# Patient Record
Sex: Male | Born: 1970 | Race: White | Hispanic: No | Marital: Married | Smoking: Current every day smoker
Health system: Southern US, Community
[De-identification: ages and names within clinical notes are randomized; demographics above are authoritative.]

## PROBLEM LIST (undated history)

## (undated) DIAGNOSIS — I2510 Atherosclerotic heart disease of native coronary artery without angina pectoris: Secondary | ICD-10-CM

## (undated) DIAGNOSIS — I214 Non-ST elevation (NSTEMI) myocardial infarction: Secondary | ICD-10-CM

## (undated) DIAGNOSIS — I10 Essential (primary) hypertension: Secondary | ICD-10-CM

## (undated) DIAGNOSIS — G4733 Obstructive sleep apnea (adult) (pediatric): Secondary | ICD-10-CM

## (undated) DIAGNOSIS — R519 Headache, unspecified: Secondary | ICD-10-CM

## (undated) DIAGNOSIS — R51 Headache: Secondary | ICD-10-CM

## (undated) DIAGNOSIS — G4730 Sleep apnea, unspecified: Secondary | ICD-10-CM

## (undated) DIAGNOSIS — E7800 Pure hypercholesterolemia, unspecified: Secondary | ICD-10-CM

## (undated) HISTORY — DX: Obstructive sleep apnea (adult) (pediatric): G47.33

## (undated) HISTORY — DX: Non-ST elevation (NSTEMI) myocardial infarction: I21.4

---

## 2003-05-13 ENCOUNTER — Emergency Department (HOSPITAL_COMMUNITY): Admission: EM | Admit: 2003-05-13 | Discharge: 2003-05-13 | Payer: Self-pay | Admitting: Emergency Medicine

## 2003-08-04 ENCOUNTER — Emergency Department (HOSPITAL_COMMUNITY): Admission: EM | Admit: 2003-08-04 | Discharge: 2003-08-04 | Payer: Self-pay | Admitting: Emergency Medicine

## 2003-08-30 ENCOUNTER — Emergency Department (HOSPITAL_COMMUNITY): Admission: EM | Admit: 2003-08-30 | Discharge: 2003-08-31 | Payer: Self-pay | Admitting: Emergency Medicine

## 2003-09-29 ENCOUNTER — Emergency Department (HOSPITAL_COMMUNITY): Admission: EM | Admit: 2003-09-29 | Discharge: 2003-09-29 | Payer: Self-pay | Admitting: Emergency Medicine

## 2007-10-18 ENCOUNTER — Inpatient Hospital Stay (HOSPITAL_COMMUNITY): Admission: EM | Admit: 2007-10-18 | Discharge: 2007-10-24 | Payer: Self-pay | Admitting: Emergency Medicine

## 2007-10-19 DIAGNOSIS — I214 Non-ST elevation (NSTEMI) myocardial infarction: Secondary | ICD-10-CM

## 2007-10-19 HISTORY — PX: CORONARY STENT PLACEMENT: SHX1402

## 2007-10-19 HISTORY — DX: Non-ST elevation (NSTEMI) myocardial infarction: I21.4

## 2007-10-23 HISTORY — PX: CORONARY STENT PLACEMENT: SHX1402

## 2010-07-03 NOTE — Cardiovascular Report (Signed)
NAMEMarland Goodwin  Isaac, Goodwin NO.:  [id number]   MEDICAL RECORD NO.:  [id number]          PATIENT TYPE:  INP   LOCATION:  2928                         FACILITY:  [facility]   PHYSICIAN:  Nicki Guadalajara, M.D.     DATE OF BIRTH:  1970-02-28   DATE OF PROCEDURE:  10/19/2007  DATE OF DISCHARGE:                            CARDIAC CATHETERIZATION   INDICATIONS:  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  He developed new onset chest pain yesterday  following his security walk.  He presented to the emergency room where  his initial ECG did show mild T-wave changes in lead III.  He was  treated with nitroglycerin as well as Dilaudid.  Subsequent ECG showed  complete normalization of his inferior ST-segment changes.  He was  treated with Integrilin, heparinization, IV nitroglycerin as well as IV  beta-blocker therapy.  He is ruled in for a non-ST segment elevation MI  with CK increasing to 492, MB 57.4, and troponin increasing up to 6.69.  He has remained asymptomatic and has not demonstrate any new EKG  changes.  He is now referred for cardiac catheterization.   PROCEDURE:  After premedication with Versed 2 mg intravenously as well  as fentanyl 50 mg, the patient was prepped and draped in the usual  fashion.  His right femoral artery was punctured anteriorly and a 6-  French sheath was inserted.  Diagnostic catheterization was done  utilizing 6-French Judkins 4 left and right coronary catheters.  The  right catheter was used for selective angiography into the left  subclavian and internal mammary artery system.  A pigtail catheter was  used for biplane selective cine left ventriculography as well as with a  demonstration of multivessel disease with 90% stenosis in the LAD, 80-  90% in the mid LAD, 80-90%  stenosis in the obtuse marginal vessel and  with 90% very proximal eccentric stenosis in the very proximal RCA near  the ostium.  It was felt most likely that in light of the ST-segment  changes transiently yesterday in the inferior leads most likely was the  RCA contributing to yesterday's event.  Possibly, the plan was to  attempt staged intervention with plans to perform LAD and circumflex  marginal intervention.  The patient had been maintained on an Integrilin  drip over the night as well as heparin.  He received additional heparin  in the lab based on ACT recordings.  An ACT was documented to be  therapeutic following additional heparinization of 6000 units.  Due to  the upper takeoff of the right, a 6-French FR-3.5 guide with side holes  was used.  A Prowater wire was advanced down the RCA.  The patient was  given 600 mg of Plavix prior to the intervention and also received  several doses of IC nitroglycerin to make certain this was not spasm.  Due to the eccentric plaque, it  was felt that predilatation with cutting  balloon arthrotomy would be beneficial for plaque dispersion.  A 3.0 x  10 mm [place] cutting balloon arthrotomy catheter was then inserted  with several cuts made up to 5 atmospheres.  Since there was an apparent  taper in the vessel from the ostium which seemed only slightly greater  than 3.0 to the proximal bend which seemed at least 3.5 Cypher stent.  This was successfully deployed x2 by 16 and 17 atmospheres.  Post-stent  dilatation was done utilizing a 3.5 x 8 mm [place] Voyager balloon with  tapering of the stent at approximately 3.25 ostially to 3.4, which was  in the distal aspect of this proximal stent.  There was brisk TIMI 3  flow.  The 90% stenosis was reduced to 0%.  The patient tolerated the  procedure well.   HEMODYNAMIC DATA:  Central aortic pressure was 105/75.  Left ventricle  pressure 105/7.  Post A-wave 20.   ANGIOGRAPHIC DATA:  Left main coronary  was angiographically normal and  bifurcated into an LAD and left circumflex system.   The LAD was a moderate-sized vessel which ended in a twin like LAD  system and did not reach the LV apex.  After the first diagonal and  moderate septal perforating artery, there was 90% stenosis in the LAD.   The circumflex vessel gave rise to 2 major marginals noted in the  posterolateral type vessel.  There was 80-90% diffuse proximal stenosis  in the OM1 vessel.   The right coronary artery had an upward takeoff and had an eccentric 90%  stenosis proximally prior to the bend in the vessel.  There was luminal  irregularity of the mid RCA with narrowing of 20% in the mid segment as  well as before the crux.  There was 40-50% followed by 40% stenosis in  the PDA.   The left subclavian and internal mammary artery were normal.   Biplane cine left ventriculography revealed normal LV contractility  without focal segmental wall motion abnormalities.  Distal aortography  did not demonstrate CA with initial cutting balloon atherotomy with a  3.0 x 10 mm atherotomy balloon and DES stenting with a 3.0 x 13 mm drug-  eluting CYPHER stent postdilated to 3.25 up to 3.4 mm.  The 90% stenosis  was reduced to 0%.  There was brisk TIMI 3 flow.  There was no evidence  for dissection.   IMPRESSION:  1. Non-ST-segment elevation myocardial infarction, October 18, 2006,      most likely due to transient spasm on top of fixed high-grade      disease in the proximal right coronary artery.  2. Normal left ventricular function.  3. A 90% mid left anterior descending artery stenosis.  4. An 80-90% stenosis in the obtuse 1 marginal vessel.  5. Eccentric proximal stenosis in the right coronary artery.  6. Successful cutting balloon atherotomy/stenting with a 3.0 x 13 mm      drug-eluting CYPHER stent just beyond the ostium proximally in the      right coronary artery with post-stent dilatation ranging from 3.25-      3.4  mm done with Integrilin/Plavix/heparin and IC as well as IV      nitroglycerin.   PLAN:  Staged intervention to the LAD intermediate vessel.           ______________________________  Nicki Guadalajara, M.D.     TK/MEDQ  D:  10/19/2007  T:  10/20/2007  Job:  [id number]

## 2010-07-03 NOTE — Cardiovascular Report (Signed)
NAME:  Isaac Goodwin, Isaac Goodwin NO.:  [id number]   MEDICAL RECORD NO.:  [id number]          PATIENT TYPE:  INP   LOCATION:  6523                         FACILITY:  [facility]   PHYSICIAN:  Nicki Guadalajara, M.D.     DATE OF BIRTH:  17-Jun-1970   DATE OF PROCEDURE:  10/23/2007  DATE OF DISCHARGE:                            CARDIAC CATHETERIZATION   INDICATIONS:  Isaac Goodwin is a 40 year old gentleman who presented  to [facility] on October 18, 2007, with new-onset chest pain.  His chest pain immediately resolved and his transient inferior T-wave  change normalized.  Enzymes subsequently were positive with non-ST-  segment elevation MI.  On October 19, 2007, he underwent diagnostic  cardiac catheterization.  This revealed multivessel CAD with 90+ percent  stenosis eccentrically with eccentric plaque in the proximal RCA which  was felt to be the culprit lesion.  He did have scattered 20% stenosis  further in the RCA with 40-50% PDA stenosis.  In addition, he had 90%  LAD stenosis after a large septal perforating artery with diffuse  narrowing in the small first diagonal vessel and had 80-90% diffuse  narrowing in the OM1 vessel of a large circumflex system.  Since his ECG  changes were transient in lead III, it was felt most likely that his RCA  was the culprit.  He underwent intervention with initial cutting balloon  and ultimate insertion of a 3.0 x 13-mm Cypher stent postdilated with  3.5 [place] Voyager balloon.  He is now brought back to the laboratory for  planned stage intervention to his LAD and circumflex marginal vessel.   PROCEDURE:  After premedication with Versed 2 mg plus fentanyl 50 mg,  the patient was prepped and draped in the usual fashion.  Right femoral  artery was punctured anteriorly and a 6-French sheath was inserted.  Initially, a 6-French diagnostic right catheter was used for re-look of  the RCA.  This demonstrated widely patent stent without  restenosis.  Attention was therefore directed at the planned stage intervention to  the LAD and OM1 vessel.  An FL-4, 6-French guide was used.  Angiomax was  given.  The patient had received 600 mg of Plavix 4 days ago and has  been on daily Plavix and had already received his morning dose of Plavix  prior to coming to the laboratory.  IC nitroglycerin was also  administered down the left coronary system.  An ATW wire was advanced  down the LAD.  Initial predilatation was done with a 3.0 x 12-mm apex  balloon.  Since the septal side branch was large and the stenosis seemed  to extend just to the superior border of the LAD above the septal, it  was felt that a stent should be placed encompassing the septal.  For  this reason, a 3.0 x 18-mm Cypher stent was then inserted and  successfully deployed x2 at 16 and 17 atmospheres.  This was to ensure  excellent side branch access and aperture.  A 3.5 x 12 mm [place] Voyager  balloon was used for poststent dilatation with vessel  tapering from 3.4  mm to 3.29 mm extending from the proximal to the distal aspect of this  stent.  The 90% stenosis was reduced to 0%.  There was not any  impingement on the septal perforating artery.   Attention was then directed at the left circumflex marginal vessel.  The  ATW wire was then advanced down the OM1 vessel.  Predilatation was done  with a 2.5 x 12-mm apex balloon.  A 2.5 x 13-mm Cypher stent was then  successfully deployed x2 at 14 and 15 atmospheres.  A 2.75 x 12-mm [place]  Voyager balloon was used for poststent dilatation up to 2.74 mm.  The  85% stenosis was reduced to 0%.  During the procedure, the patient  received several additional doses of IV fentanyl as well as Versed.  He  also received several doses of IC nitroglycerin.  He tolerated the  procedure well with.  Plans are for sheath removal later today.   HEMODYNAMIC DATA:  Central aortic pressure was 100/72.   ANGIOGRAPHIC DATA:  Re-look at the right  coronary artery showed a widely  patent RCA stent without restenosis.  There was no change in the  scattered RCA disease as noted above with 40% narrowing in the PDA and  20% narrowing in the mid and distal segments.   The left anterior descending artery arose from the left main and was  large-caliber vessel.  The first diagonal vessel was small caliber and  there was diffuse 90% segmental stenoses involving a long segment of  this small vessel.  This was not intervened upon.  The LAD gave rise to  a large septal perforating artery that had 90% stenosis extending to the  septal perforator, but there did appear to be plaque on the superior  aspect of the LAD just above the septal and for this reason, the stent  placement was proximal to the septal extending distally.  Following  ultimate insertion of a 3.0 x 18-mm Cypher stent with postdilatation  tapering from 3.4-3.29 mm, the 90% stenosis was reduced to 0%.  There  was TIMI III flow and no evidence for dissection.   The circumflex vessel was large-caliber vessel in the AV groove portion  in particular.  The first marginal vessel had diffuse 80-85% stenosis.  Following successful dilatation with ultimate stenting with a 2.5 x 13-  mm Cypher stent postdilated at 2.75 mm, this was reduced to 0%.  There  was brisk TIMI III flow and no evidence for dissection.   IMPRESSION:  1. Successful percutaneous transluminal coronary angioplasty/stenting      of a 90% left anterior descending stenosis after a very large first      septal perforating artery with ultimate insertion of a 3.0 x 18-mm      Cypher stent postdilated to a vessel taper of 3.4-3.3 mm with the      90% stenosis being reduced to 0%.  2. Successful percutaneous transluminal coronary angioplasty stenting      of the left circumflex obtuse marginal 1 vessel with an 80-85%      stenosis being reduced to 0% with ultimate insertion of a 2.5 x 13-      mm Cypher stent postdilated 2.75  mm.  3. Widely patent right coronary artery stent from procedure of October 19, 2007, done with bivalirudin/Plavix/intracoronary IV      nitroglycerin.           ______________________________  Nicki Guadalajara, M.D.  TK/MEDQ  D:  10/23/2007  T:  10/24/2007  Job:  [id number]

## 2010-07-03 NOTE — Discharge Summary (Signed)
NAMEJOHNWESLEY, LEDERMAN NO.:  [id number]   MEDICAL RECORD NO.:  [id number]          PATIENT TYPE:  INP   LOCATION:  6523                         FACILITY:  [facility]   PHYSICIAN:  Nicki Guadalajara, M.D.     DATE OF BIRTH:  [birth year]   DATE OF ADMISSION:  10/18/2007  DATE OF DISCHARGE:  10/24/2007                               DISCHARGE SUMMARY   DISCHARGE DIAGNOSES:  1. ST myocardial infarction this admission, treated with right      coronary artery stenting on October 19, 2007, with a staged      intervention to the left anterior descending and Cypher stenting on      October 23, 2007.  2. Preserved left ventricular function.  3. History of smoking.  4. History of hypertension, previously untreated.  5. Dyslipidemia.   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  He was admitted on October 18, 2007, with  chest pain consistent with unstable angina.  He was admitted by Dr.  [name] through the emergency room and started on heparin, Integrilin, and  nitroglycerin.  His EKG showed inferior ischemia.  He was taken to the  Cath Lab on October 19, 2007.  His CKs peaked at 492 with 57 MBs.  Catheterization on October 19, 2007, showed a 95% proximal RCA stenosis,  80-90% OM1 stenosis, 90% LAD stenosis after the diagonal, and diffusely  diseased small first diagonal.  LV function was normal.  He underwent  intervention to the native RCA with a cutting balloon and Cypher  stenting.  Plan was for staged intervention to the LAD.  The patient was  kept in the unit on nitrates until October 20, 2007.  He was  transferred to the floor, had no further pain.  He was seen in consult  by the tobacco cessation counselor.  Cardiac Rehab was also seen him.  Medications were adjusted for treatment of his hypertension and  dyslipidemia.  He underwent staged intervention to the LAD with a Cypher  stent and also an intervention to the OM1 with a  Cypher stent.  Dr.  [name] plans to treat the diffusely diseased small first diagonal  medically.  We feel he can be discharged on October 24, 2007, pending a  followup CK-MB and troponin.   DISCHARGE MEDICATIONS:  1. Coated aspirin 325 mg a day.  2. Plavix 75 mg a day.  3. Simvastatin 40 mg a day.  4. Pepcid 20 mg a day.  5. Niacin SR 500 mg a day.  6. Metoprolol 500 mg b.i.d.  7. Imdur 30 mg a day.  8. Altace 5 mg a day.  9. Wellbutrin SR 150 mg a day.  10.Nitroglycerin sublingual p.r.n.   LABORATORY DATA:  CKs peaked at 492 with 57 MBs.  Lipids showed  cholesterol 165, LDL 108, HDL 21.  LFTs were normal.  Sodium 139,  potassium 4.1, BUN 12, creatinine 1.3.  White count 9.0, hemoglobin  14.3, hematocrit 41.2, platelets 224.  Chest x-ray shows no active  disease.   DISPOSITION:  The patient is discharged in stable condition.  He will  follow up with Dr. [name] in a couple of weeks as an outpatient.      Abelino Derrick, P.A.    ______________________________  Nicki Guadalajara, M.D.    Lenard Lance  D:  10/24/2007  T:  10/24/2007  Job:  [id number]   cc:   Nicki Guadalajara, M.D.

## 2010-11-21 LAB — CBC
HCT: 41
HCT: 43
Hemoglobin: 13.9
Hemoglobin: 14.2
Hemoglobin: 14.3
Hemoglobin: 14.7
MCHC: 34.2
Platelets: 224
RBC: 4.41
RBC: 4.51
RBC: 4.71
RDW: 12.6
RDW: 12.6
RDW: 12.8
WBC: 8.6
WBC: 9

## 2010-11-21 LAB — BASIC METABOLIC PANEL
BUN: 12
CO2: 25
CO2: 25
CO2: 27
Calcium: 9
Calcium: 9.1
Calcium: 9.2
Chloride: 103
Creatinine, Ser: 1.06
GFR calc Af Amer: >60
GFR calc Af Amer: >60
GFR calc Af Amer: >60
GFR calc non Af Amer: 60 — ABNORMAL LOW
GFR calc non Af Amer: >60
GFR calc non Af Amer: >60
Glucose, Bld: 101 — ABNORMAL HIGH
Glucose, Bld: 104 — ABNORMAL HIGH
Glucose, Bld: 86
Potassium: 3.5
Potassium: 3.8
Potassium: 4.1
Sodium: 136
Sodium: 137
Sodium: 138
Sodium: 139

## 2010-11-21 LAB — CARDIAC PANEL(CRET KIN+CKTOT+MB+TROPI)
Relative Index: 2.4
Total CK: 100
Troponin I: 2.44

## 2010-11-21 LAB — CK TOTAL AND CKMB (NOT AT ARMC)
CK, MB: 10.1 — ABNORMAL HIGH
CK, MB: 5.1 — ABNORMAL HIGH
Relative Index: 4.2 — ABNORMAL HIGH
Relative Index: 6.8 — ABNORMAL HIGH
Total CK: 147

## 2010-11-21 LAB — GLUCOSE, CAPILLARY: Glucose-Capillary: 124 — ABNORMAL HIGH

## 2010-11-21 LAB — TROPONIN I: Troponin I: 0.4 — ABNORMAL HIGH

## 2011-10-17 ENCOUNTER — Encounter (HOSPITAL_COMMUNITY): Payer: Self-pay | Admitting: *Deleted

## 2011-10-17 ENCOUNTER — Emergency Department (HOSPITAL_COMMUNITY): Payer: Medicaid Other

## 2011-10-17 ENCOUNTER — Emergency Department (HOSPITAL_COMMUNITY)
Admission: EM | Admit: 2011-10-17 | Discharge: 2011-10-18 | Disposition: A | Discharge status: Home / Self Care | Payer: Medicaid Other | Attending: Emergency Medicine | Admitting: Emergency Medicine

## 2011-10-17 ENCOUNTER — Emergency Department (HOSPITAL_COMMUNITY)
Admission: EM | Admit: 2011-10-17 | Discharge: 2011-10-17 | Disposition: A | Discharge status: Home / Self Care | Payer: Medicaid Other | Attending: Emergency Medicine | Admitting: Emergency Medicine

## 2011-10-17 DIAGNOSIS — I10 Essential (primary) hypertension: Secondary | ICD-10-CM | POA: Insufficient documentation

## 2011-10-17 DIAGNOSIS — M62838 Other muscle spasm: Secondary | ICD-10-CM

## 2011-10-17 DIAGNOSIS — E7800 Pure hypercholesterolemia, unspecified: Secondary | ICD-10-CM | POA: Insufficient documentation

## 2011-10-17 DIAGNOSIS — G4730 Sleep apnea, unspecified: Secondary | ICD-10-CM | POA: Insufficient documentation

## 2011-10-17 DIAGNOSIS — I2510 Atherosclerotic heart disease of native coronary artery without angina pectoris: Secondary | ICD-10-CM | POA: Insufficient documentation

## 2011-10-17 DIAGNOSIS — G44209 Tension-type headache, unspecified, not intractable: Secondary | ICD-10-CM

## 2011-10-17 DIAGNOSIS — R51 Headache: Secondary | ICD-10-CM

## 2011-10-17 DIAGNOSIS — F17200 Nicotine dependence, unspecified, uncomplicated: Secondary | ICD-10-CM | POA: Insufficient documentation

## 2011-10-17 DIAGNOSIS — Z79899 Other long term (current) drug therapy: Secondary | ICD-10-CM | POA: Insufficient documentation

## 2011-10-17 DIAGNOSIS — Z720 Tobacco use: Secondary | ICD-10-CM

## 2011-10-17 DIAGNOSIS — Z880 Allergy status to penicillin: Secondary | ICD-10-CM | POA: Insufficient documentation

## 2011-10-17 DIAGNOSIS — J069 Acute upper respiratory infection, unspecified: Secondary | ICD-10-CM

## 2011-10-17 HISTORY — DX: Atherosclerotic heart disease of native coronary artery without angina pectoris: I25.10

## 2011-10-17 HISTORY — DX: Pure hypercholesterolemia, unspecified: E78.00

## 2011-10-17 HISTORY — DX: Sleep apnea, unspecified: G47.30

## 2011-10-17 HISTORY — DX: Headache, unspecified: R51.9

## 2011-10-17 HISTORY — DX: Essential (primary) hypertension: I10

## 2011-10-17 HISTORY — DX: Headache: R51

## 2011-10-17 LAB — CBC WITH DIFFERENTIAL/PLATELET
Basophils Absolute: 0.1 10*3/uL (ref 0.0–0.1)
Eosinophils Absolute: 0.3 10*3/uL (ref 0.0–0.7)
Eosinophils Relative: 2 % (ref 0–5)
HCT: 48.4 % (ref 39.0–52.0)
Lymphocytes Relative: 23 % (ref 12–46)
MCH: 32.2 pg (ref 26.0–34.0)
MCV: 87.5 fL (ref 78.0–100.0)
Monocytes Absolute: 0.8 10*3/uL (ref 0.1–1.0)
Platelets: 219 10*3/uL (ref 150–400)
RDW: 12.3 % (ref 11.5–15.5)
WBC: 10.8 10*3/uL — ABNORMAL HIGH (ref 4.0–10.5)

## 2011-10-17 LAB — BASIC METABOLIC PANEL
CO2: 29 mEq/L (ref 19–32)
Calcium: 9.9 mg/dL (ref 8.4–10.5)
Creatinine, Ser: 0.96 mg/dL (ref 0.50–1.35)
GFR calc non Af Amer: >90 mL/min (ref >90)
Glucose, Bld: 109 mg/dL — ABNORMAL HIGH (ref 70–99)

## 2011-10-17 LAB — RAPID URINE DRUG SCREEN, HOSP PERFORMED
Amphetamines: NOT DETECTED
Benzodiazepines: NOT DETECTED
Cocaine: POSITIVE — AB
Opiates: NOT DETECTED

## 2011-10-17 LAB — URINALYSIS, ROUTINE W REFLEX MICROSCOPIC
Glucose, UA: NEGATIVE mg/dL
Hgb urine dipstick: NEGATIVE
Ketones, ur: NEGATIVE mg/dL
Protein, ur: NEGATIVE mg/dL
Urobilinogen, UA: 1 mg/dL (ref 0.0–1.0)

## 2011-10-17 MED ORDER — HYDROMORPHONE HCL PF 1 MG/ML IJ SOLN
0.5000 mg | INTRAMUSCULAR | Status: AC
  Administered 2011-10-17: 0.5 mg via INTRAVENOUS
  Filled 2011-10-17: qty 1

## 2011-10-17 MED ORDER — KETOROLAC TROMETHAMINE 30 MG/ML IJ SOLN: 30.0000 mg | Freq: Once | INTRAMUSCULAR | Status: DC

## 2011-10-17 MED ORDER — RAMIPRIL 5 MG PO CAPS
5.0000 mg | ORAL_CAPSULE | Freq: Every day | ORAL | Status: DC
  Administered 2011-10-17: 5 mg via ORAL
  Filled 2011-10-17: qty 1

## 2011-10-17 MED ORDER — MOMETASONE FUROATE 50 MCG/ACT NA SUSP: 2.0000 | Freq: Every day | NASAL | Status: DC

## 2011-10-17 MED ORDER — KETOROLAC TROMETHAMINE 60 MG/2ML IM SOLN
60.0000 mg | Freq: Once | INTRAMUSCULAR | Status: AC
  Administered 2011-10-17: 60 mg via INTRAMUSCULAR
  Filled 2011-10-17: qty 2

## 2011-10-17 MED ORDER — LORATADINE 10 MG PO TABS
10.0000 mg | ORAL_TABLET | Freq: Once | ORAL | Status: AC
  Administered 2011-10-17: 10 mg via ORAL
  Filled 2011-10-17: qty 1

## 2011-10-17 MED ORDER — RAMIPRIL 5 MG PO CAPS: 5.0000 mg | ORAL_CAPSULE | Freq: Every day | ORAL | Status: DC

## 2011-10-17 MED ORDER — RAMIPRIL 5 MG PO CAPS
5.0000 mg | ORAL_CAPSULE | Freq: Every day | ORAL | Status: DC
  Filled 2011-10-17: qty 1

## 2011-10-17 MED ORDER — OXYCODONE-ACETAMINOPHEN 5-325 MG PO TABS
2.0000 | ORAL_TABLET | Freq: Once | ORAL | Status: AC
  Administered 2011-10-17: 2 via ORAL
  Filled 2011-10-17: qty 2

## 2011-10-17 MED ORDER — KETOROLAC TROMETHAMINE 30 MG/ML IJ SOLN
INTRAMUSCULAR | Status: AC
  Filled 2011-10-17: qty 1

## 2011-10-17 MED ORDER — RAMIPRIL 5 MG PO CAPS
5.0000 mg | ORAL_CAPSULE | ORAL | Status: AC
  Administered 2011-10-17: 5 mg via ORAL

## 2011-10-17 MED ORDER — DIAZEPAM 5 MG/ML IJ SOLN
5.0000 mg | Freq: Once | INTRAMUSCULAR | Status: AC
  Administered 2011-10-17: 5 mg via INTRAVENOUS
  Filled 2011-10-17: qty 2

## 2011-10-17 MED ORDER — KETOROLAC TROMETHAMINE 15 MG/ML IJ SOLN
15.0000 mg | Freq: Once | INTRAMUSCULAR | Status: DC
  Filled 2011-10-17: qty 1

## 2011-10-17 MED ORDER — METOPROLOL TARTRATE 25 MG PO TABS
50.0000 mg | ORAL_TABLET | Freq: Once | ORAL | Status: AC
  Administered 2011-10-17: 50 mg via ORAL
  Filled 2011-10-17: qty 2

## 2011-10-17 MED ORDER — POTASSIUM CHLORIDE CRYS ER 20 MEQ PO TBCR
40.0000 meq | EXTENDED_RELEASE_TABLET | Freq: Once | ORAL | Status: AC
  Administered 2011-10-17: 40 meq via ORAL
  Filled 2011-10-17: qty 2

## 2011-10-17 MED ORDER — KETOROLAC TROMETHAMINE 30 MG/ML IJ SOLN
15.0000 mg | Freq: Once | INTRAMUSCULAR | Status: AC
  Administered 2011-10-17: 15 mg via INTRAVENOUS

## 2011-10-17 MED ORDER — METOPROLOL TARTRATE 100 MG PO TABS: 50.0000 mg | ORAL_TABLET | Freq: Two times a day (BID) | ORAL | Status: DC

## 2011-10-17 NOTE — ED Notes (Signed)
PHARMACIST ( FLETCHER ) NOTIFIED ON PT'S RAMIPRIL ORDER.

## 2011-10-17 NOTE — ED Notes (Signed)
Lab & imaging results reviewed. Pt updated. Reports HA remains, 8/10. BP noted to be significantly high (still), EDP notified, EDP in to see pt, family at [facility]. Pt alert, NAD, calm, interactive, skin W&D, resps e/u, speaking in clear complete sentences.

## 2011-10-17 NOTE — ED Provider Notes (Signed)
History     CSN: [id number]  Arrival date & time 10/17/11  0105   First MD Initiated Contact with Patient 10/17/11 0123      Chief Complaint  Patient presents with  . Headache  . Hypertension    (Consider location/radiation/quality/duration/timing/severity/associated sxs/prior treatment) HPI 41 year old male presents to emergency room complaining of headache and elevated blood pressure. Patient reports several weeks of nasal drainage, using saline mist to help with dryness in his nose. Over the last week he has had increasing pressure in his sinuses. He denies any fevers. Patient is status post cardiac stents 3 years ago. He has taken himself off of his medications, and is currently taking herbal medications. He reports his blood pressures have been controlled. Patient smokes 3-4 cigarettes a day. Patient reports he feels the way he did when he had sinusitis in the past. Tonight the pressure was a little worse than normal, and he felt like maybe his blood pressure was high so he decided to come to the emergency department. He did not check his pressure prior to arrival. Patient has been taking sinus medicine over-the-counter for his sinus pressure pain, and is worried that he took a medicine he is not supposed to take.  Past Medical History  Diagnosis Date  . Coronary artery disease   . Hypertension   . Hypercholesteremia   . Sleep apnea   . Sinus headache     Past Surgical History  Procedure Date  . Coronary stent placement     4    No family history on file.  History  Substance Use Topics  . Smoking status: Former Smoker    Types: Cigarettes  . Smokeless tobacco: Not on file  . Alcohol Use: 2.4 oz/week    4 Glasses of wine per week      Review of Systems  All other systems reviewed and are negative.    Allergies  Penicillins  Home Medications   Current Outpatient Rx  Name Route Sig Dispense Refill  . ASPIRIN 325 MG PO TABS Oral Take 325 mg by mouth 2 (two)  times daily.    . CO Q 10 PO Oral Take 1 tablet by mouth daily.    [name] SINUS MEDICINE PO Oral Take 1 tablet by mouth every 4 (four) hours as needed. Sinus congestion    . CHOLEST OFF PO Oral Take 2 tablets by mouth 2 (two) times daily.    [name] SALINE 0.65 % NA SOLN Nasal Place 1 spray into the nose 3 (three) times daily as needed. Nasal congestion      BP 200/128  Pulse 95  Temp 98.8 F (37.1 C) (Oral)  Resp 20  SpO2 96%  Physical Exam  Nursing note and vitals reviewed. Constitutional: He is oriented to person, place, and time. He appears well-developed and well-nourished.  HENT:  Head: Normocephalic and atraumatic.  Right Ear: External ear normal.  Left Ear: External ear normal.  Nose: Nose normal.  Mouth/Throat: Oropharynx is clear and moist.  Eyes: Conjunctivae and EOM are normal. Pupils are equal, round, and reactive to light.  Neck: Normal range of motion. Neck supple. No JVD present. No tracheal deviation present. No thyromegaly present.  Cardiovascular: Normal rate, regular rhythm and intact distal pulses.  Exam reveals no gallop and no friction rub.   Murmur heard.      2/6 murmur RUSB  Pulmonary/Chest: Effort normal and breath sounds normal. No stridor. No respiratory distress. He has no wheezes. He has no rales.  He exhibits no tenderness.  Abdominal: Soft. Bowel sounds are normal. He exhibits no distension and no mass. There is no tenderness. There is no rebound and no guarding.  Musculoskeletal: Normal range of motion. He exhibits no edema and no tenderness.  Lymphadenopathy:    He has no cervical adenopathy.  Neurological: He is alert and oriented to person, place, and time. He exhibits normal muscle tone. Coordination normal.  Skin: Skin is warm and dry. No rash noted. No erythema. No pallor.  Psychiatric: He has a normal mood and affect. His behavior is normal. Judgment and thought content normal.    ED Course  Procedures (including critical care time)  Labs Reviewed   CBC WITH DIFFERENTIAL - Abnormal; Notable for the following:    WBC 10.8 (*)     Hemoglobin 17.8 (*)     MCHC 36.8 (*)     All other components within normal limits  BASIC METABOLIC PANEL - Abnormal; Notable for the following:    Potassium 2.9 (*)     Glucose, Bld 109 (*)     All other components within normal limits  URINE RAPID DRUG SCREEN (HOSP PERFORMED)  URINALYSIS, ROUTINE W REFLEX MICROSCOPIC   Ct Head Wo Contrast  10/17/2011  *RADIOLOGY REPORT*  Clinical Data: Headache  CT HEAD WITHOUT CONTRAST,CT MAXILLOFACIAL WITHOUT CONTRAST  Technique:  Contiguous axial images were obtained from the base of the skull through the vertex without contrast.,Technique: Multidetector CT imaging of the maxillofacial structures was performed. Multiplanar CT image reconstructions were also generated.  Comparison: 09/29/2003  Findings:  Head:  There is no evidence for acute hemorrhage, hydrocephalus, mass lesion, or abnormal extra-axial fluid collection.  No definite CT evidence for acute infarction.  IMPRESSION: No acute intracranial abnormality.  Maxillofacial:  Globes are symmetric.  Lenses are located.  No retrobulbar hematoma.  Intact orbital walls and sinus walls. Left maxillary sinus mucous retention cyst versus polyp.  The paranasal sinuses and mastoid air cells are otherwise clear.  Intact zygomatic arches, pterygoid plates, nasal septum, and nasal bones. Symmetric parotid and submandibular glands.  No appreciable adenopathy.  Intact mandible.  No dislocation.  Upper cervical spine unremarkable.  Impression:  No acute maxillofacial pathology.  Left maxillary sinus polyp versus mucous retention cyst.  Paranasal sinuses are otherwise clear.   Original Report Authenticated By: Waneta Martins, M.D.    Ct Maxillofacial Wo Cm  10/17/2011  *RADIOLOGY REPORT*  Clinical Data: Headache  CT HEAD WITHOUT CONTRAST,CT MAXILLOFACIAL WITHOUT CONTRAST  Technique:  Contiguous axial images were obtained from the base  of the skull through the vertex without contrast.,Technique: Multidetector CT imaging of the maxillofacial structures was performed. Multiplanar CT image reconstructions were also generated.  Comparison: 09/29/2003  Findings:  Head:  There is no evidence for acute hemorrhage, hydrocephalus, mass lesion, or abnormal extra-axial fluid collection.  No definite CT evidence for acute infarction.  IMPRESSION: No acute intracranial abnormality.  Maxillofacial:  Globes are symmetric.  Lenses are located.  No retrobulbar hematoma.  Intact orbital walls and sinus walls. Left maxillary sinus mucous retention cyst versus polyp.  The paranasal sinuses and mastoid air cells are otherwise clear.  Intact zygomatic arches, pterygoid plates, nasal septum, and nasal bones. Symmetric parotid and submandibular glands.  No appreciable adenopathy.  Intact mandible.  No dislocation.  Upper cervical spine unremarkable.  Impression:  No acute maxillofacial pathology.  Left maxillary sinus polyp versus mucous retention cyst.  Paranasal sinuses are otherwise clear.   Original Report Authenticated By: Greig Castilla  J. [facility], M.D.      Date: 10/17/2011  Rate: 92  Rhythm: normal sinus rhythm  QRS Axis: normal  Intervals: normal  ST/T Wave abnormalities: normal  Conduction Disutrbances:none  Narrative Interpretation: left atrial enlargement new, rate increased from prior  Old EKG Reviewed: changes noted   1. Headache   2. Hypertension       MDM  41 year old male who presents with significantly elevated blood pressures along with headache. We'll check baseline labs as well as CT scan looking for sinusitis.        Olivia Mackie, MD 10/17/11 [phone]

## 2011-10-17 NOTE — ED Notes (Signed)
PT c/o sinus pressure/pain (especially when bending over) and swollen cervical lymph nodes x 1 week. He denies fever and states this pain is the same as it was the last time he had sinus infection.  He came in tonight b/c he was worried about his bp being elevated d/t pain and he has had 4 cardiac stents placed.

## 2011-10-17 NOTE — ED Provider Notes (Signed)
History     CSN: [id number]  Arrival date & time 10/17/11  1656   First MD Initiated Contact with Patient 10/17/11 2007      Chief Complaint  Patient presents with  . Hypertension    (Consider location/radiation/quality/duration/timing/severity/associated sxs/prior treatment) HPI Isaac Goodwin is a 41 y.o. male was seen last night early this morning for concerns for sinus infection. Patient says he is having continued sinus pressure headache and he thinks this might be due to his blood pressure. Patient was taking blood pressure medications at one time however discontinued them because he "slow down on his smoking" and has not taken his blood pressure medications including ramipril and metoprolol. Patient's cardiologist is Dr. [name]. He was given Ramipril last night has taken one dose of that. Patient was previously using phenylephrine and pseudoephedrine-containing products for sinus issues. Most concerned about headache which is currently 10/10, sharp, stabbing, seems to originate from bilateral paraspinous cervical muscles also associated with a stabbing sensation on the vertex and on the right temporal region. Patient says he has no history of migraine or cluster headache. Does have issues with the turbinates in the right side of his nostril.    He does have a pertinent cardiac history of having catheterization and acute myocardial infarction he is currently not complaining of any shortness of breath, chest pain, nausea, vomiting, dizziness, lightheadedness, change in vision, double vision, paresthesias or weakness.  Past Medical History  Diagnosis Date  . Coronary artery disease   . Hypertension   . Hypercholesteremia   . Sleep apnea   . Sinus headache     Past Surgical History  Procedure Date  . Coronary stent placement     4    History reviewed. No pertinent family history.  History  Substance Use Topics  . Smoking status: Current Some Day Smoker    Types: Cigarettes  .  Smokeless tobacco: Not on file  . Alcohol Use: 2.4 oz/week    4 Glasses of wine per week      Review of Systems Negative unless mentioned in history of present illness Allergies  Penicillins  Home Medications   Current Outpatient Rx  Name Route Sig Dispense Refill  . ASPIRIN 325 MG PO TABS Oral Take 325 mg by mouth 2 (two) times daily.    . CO Q 10 PO Oral Take 1 tablet by mouth daily.    [name] SINUS MEDICINE PO Oral Take 1 tablet by mouth every 4 (four) hours as needed. Sinus congestion    . MOMETASONE FUROATE 50 MCG/ACT NA SUSP Nasal Place 2 sprays into the nose daily. 17 g 12  . CHOLEST OFF PO Oral Take 2 tablets by mouth 2 (two) times daily.    [name] RAMIPRIL 5 MG PO CAPS Oral Take 1 capsule (5 mg total) by mouth daily. 30 capsule 0  . SALINE 0.65 % NA SOLN Nasal Place 1 spray into the nose 3 (three) times daily as needed. Nasal congestion      BP 184/118  Pulse 88  Temp 98.2 F (36.8 C) (Oral)  Resp 14  SpO2 98%  Physical Exam VITAL SIGNS:   Filed Vitals:   10/17/11 2300  BP: 172/128  Pulse: 67  Temp:   Resp:    CONSTITUTIONAL: Awake, oriented, appears non-toxic, smells of cigarettes HENT: Atraumatic, normocephalic, oral mucosa pink and moist, airway patent. Nares patent with clear drainage, mildly swollen turbinates - right side greater than left, she says this is chronic. External ears normal.  EYES: Conjunctiva clear, EOMI, PERRLA NECK: Trachea midline, non-tender, supple CARDIOVASCULAR: Normal heart rate, Normal rhythm, No murmurs, rubs, gallops PULMONARY/CHEST: Clear to auscultation, no rhonchi, wheezes, or rales. Symmetrical breath sounds. Non-tender. ABDOMINAL: Non-distended, soft, non-tender - no rebound or guarding.  BS normal. NEUROLOGIC: Non-focal, moving all four extremities, no gross sensory or motor deficits. EXTREMITIES: No clubbing, cyanosis, or edema SKIN: Warm, Dry, No erythema, No rash  ED Course  Procedures (including critical care time)  Labs  Reviewed - No data to display Ct Head Wo Contrast  10/17/2011  *RADIOLOGY REPORT*  Clinical Data: Headache  CT HEAD WITHOUT CONTRAST,CT MAXILLOFACIAL WITHOUT CONTRAST  Technique:  Contiguous axial images were obtained from the base of the skull through the vertex without contrast.,Technique: Multidetector CT imaging of the maxillofacial structures was performed. Multiplanar CT image reconstructions were also generated.  Comparison: 09/29/2003  Findings:  Head:  There is no evidence for acute hemorrhage, hydrocephalus, mass lesion, or abnormal extra-axial fluid collection.  No definite CT evidence for acute infarction.  IMPRESSION: No acute intracranial abnormality.  Maxillofacial:  Globes are symmetric.  Lenses are located.  No retrobulbar hematoma.  Intact orbital walls and sinus walls. Left maxillary sinus mucous retention cyst versus polyp.  The paranasal sinuses and mastoid air cells are otherwise clear.  Intact zygomatic arches, pterygoid plates, nasal septum, and nasal bones. Symmetric parotid and submandibular glands.  No appreciable adenopathy.  Intact mandible.  No dislocation.  Upper cervical spine unremarkable.  Impression:  No acute maxillofacial pathology.  Left maxillary sinus polyp versus mucous retention cyst.  Paranasal sinuses are otherwise clear.   Original Report Authenticated By: Waneta Martins, M.D.    Ct Maxillofacial Wo Cm  10/17/2011  *RADIOLOGY REPORT*  Clinical Data: Headache  CT HEAD WITHOUT CONTRAST,CT MAXILLOFACIAL WITHOUT CONTRAST  Technique:  Contiguous axial images were obtained from the base of the skull through the vertex without contrast.,Technique: Multidetector CT imaging of the maxillofacial structures was performed. Multiplanar CT image reconstructions were also generated.  Comparison: 09/29/2003  Findings:  Head:  There is no evidence for acute hemorrhage, hydrocephalus, mass lesion, or abnormal extra-axial fluid collection.  No definite CT evidence for acute  infarction.  IMPRESSION: No acute intracranial abnormality.  Maxillofacial:  Globes are symmetric.  Lenses are located.  No retrobulbar hematoma.  Intact orbital walls and sinus walls. Left maxillary sinus mucous retention cyst versus polyp.  The paranasal sinuses and mastoid air cells are otherwise clear.  Intact zygomatic arches, pterygoid plates, nasal septum, and nasal bones. Symmetric parotid and submandibular glands.  No appreciable adenopathy.  Intact mandible.  No dislocation.  Upper cervical spine unremarkable.  Impression:  No acute maxillofacial pathology.  Left maxillary sinus polyp versus mucous retention cyst.  Paranasal sinuses are otherwise clear.   Original Report Authenticated By: Waneta Martins, M.D.      No diagnosis found.    MDM  Isaac Goodwin is a 41 y.o. male presenting with headache and concerns for his high blood pressure. Patient also seems to be high anxiety type patient, and is fixated on his high blood pressure numbers.  We'll give the patient his blood pressure medication here tonight, and have discussed future blood pressure management with his cardiologist Dr. [name], patient's heart rate emergency department has been in the 80s and 90s still start him on low-dose metoprolol.  Patient is having no symptoms of end organ damage, I do not think there is any this and obtaining any labs or imaging at this time.  Patient is given pain medicine and has responded well, headache has improved and he is hungry asking to be discharged home to go eat. Pressures have decreased, but patient has just re-started his ACE inhibitor.  Pt has Rx for ramipril filled.  We'll discharge him home with pain medicine for his headaches and also instruct him to take claritin for allergic rhinitis (can't afford fluticasone prescribed last night)and  2 weeks supply of his metoprolol so that he can go and see Dr. [name] given return precautions discharged home stable and good condition.          Jones Skene, MD 10/18/11 9211

## 2011-10-17 NOTE — ED Notes (Signed)
Pt states he was here this am for sinus infection. Reports he is having continued sinus pressure, headache, and hypertension. Pt was given blood pressure medication this am before discharge and given rx, rx is for once a day. Pt here for eval. HA rates 8/10.

## 2011-10-18 MED ORDER — OXYCODONE-ACETAMINOPHEN 5-325 MG PO TABS: 2.0000 | ORAL_TABLET | ORAL | Status: AC | PRN

## 2011-10-18 MED ORDER — METOPROLOL TARTRATE 100 MG PO TABS: 50.0000 mg | ORAL_TABLET | Freq: Two times a day (BID) | ORAL | Status: DC

## 2011-11-01 ENCOUNTER — Encounter (HOSPITAL_COMMUNITY): Payer: Self-pay | Admitting: Emergency Medicine

## 2011-11-01 ENCOUNTER — Emergency Department (HOSPITAL_COMMUNITY)
Admission: EM | Admit: 2011-11-01 | Discharge: 2011-11-02 | Disposition: A | Discharge status: Home / Self Care | Payer: Self-pay | Attending: Emergency Medicine | Admitting: Emergency Medicine

## 2011-11-01 DIAGNOSIS — I2510 Atherosclerotic heart disease of native coronary artery without angina pectoris: Secondary | ICD-10-CM | POA: Insufficient documentation

## 2011-11-01 DIAGNOSIS — I10 Essential (primary) hypertension: Secondary | ICD-10-CM | POA: Insufficient documentation

## 2011-11-01 DIAGNOSIS — R51 Headache: Secondary | ICD-10-CM | POA: Insufficient documentation

## 2011-11-01 NOTE — ED Notes (Addendum)
Reports having head pain--starting this afternoon- reports has been dealing with headaches for 4-5 weeks intermittently; reports cold/clammy, sweating; took 3 tylenol about 5pm; denies CP, difficulty breathing; reports nausea; pt's family commented that pt sounds "congested"

## 2011-11-02 ENCOUNTER — Emergency Department (HOSPITAL_COMMUNITY): Payer: Self-pay

## 2011-11-02 MED ORDER — HYDROMORPHONE HCL PF 1 MG/ML IJ SOLN
1.0000 mg | Freq: Once | INTRAMUSCULAR | Status: AC
  Administered 2011-11-02: 1 mg via INTRAVENOUS
  Filled 2011-11-02: qty 1

## 2011-11-02 MED ORDER — METOCLOPRAMIDE HCL 5 MG/ML IJ SOLN
10.0000 mg | Freq: Once | INTRAMUSCULAR | Status: AC
  Administered 2011-11-02: 10 mg via INTRAVENOUS
  Filled 2011-11-02: qty 2

## 2011-11-02 MED ORDER — SODIUM CHLORIDE 0.9 % IV BOLUS (SEPSIS)
1000.0000 mL | Freq: Once | INTRAVENOUS | Status: AC
  Administered 2011-11-02: 1000 mL via INTRAVENOUS

## 2011-11-02 MED ORDER — OXYCODONE-ACETAMINOPHEN 5-325 MG PO TABS: 1.0000 | ORAL_TABLET | ORAL | Status: DC | PRN

## 2011-11-02 MED ORDER — KETOROLAC TROMETHAMINE 30 MG/ML IJ SOLN
30.0000 mg | Freq: Once | INTRAMUSCULAR | Status: AC
  Administered 2011-11-02: 30 mg via INTRAVENOUS
  Filled 2011-11-02: qty 1

## 2011-11-02 MED ORDER — OXYCODONE-ACETAMINOPHEN 5-325 MG PO TABS
1.0000 | ORAL_TABLET | Freq: Once | ORAL | Status: AC
  Administered 2011-11-02: 1 via ORAL
  Filled 2011-11-02: qty 1

## 2011-11-02 NOTE — ED Provider Notes (Signed)
History     CSN: [id number]  Arrival date & time 11/01/11  2046   First MD Initiated Contact with Patient 11/01/11 2350      Chief Complaint  Patient presents with  . Headache    (Consider location/radiation/quality/duration/timing/severity/associated sxs/prior treatment) The history is provided by the patient.   patient's had a headache for the past 4-5 weeks.  He reports is intermittent and is throbbing in nature.  It radiates from the back of his head up towards the front portion of his scalp.  Denies chest pain shortness of breath.  He does have a heart history with recent stented coronary arteries.  He denies symptoms consistent with his prior ACS.  He reports no weakness in his upper lower extremities.  He has no neck pain.  He reports that having some difficulty controlling his blood pressure and he saw his cardiologist this week regarding his blood pressure at this time is continuing him on his current doses of REM up her on metoprolol.  Patient reports his headache came on abruptly around 6.  He's had no vomiting.  He tried Tylenol without improvement in his symptoms.  He did have some mild nausea.  Pain is moderate in severity at this time.  His blood pressure on arrival was 175/123.  Past Medical History  Diagnosis Date  . Hypertension   . Hypercholesteremia   . Sleep apnea   . Sinus headache   . Coronary artery disease     Past Surgical History  Procedure Date  . Coronary stent placement     4    History reviewed. No pertinent family history.  History  Substance Use Topics  . Smoking status: Current Some Day Smoker    Types: Cigarettes  . Smokeless tobacco: Not on file  . Alcohol Use: 2.4 oz/week    4 Glasses of wine per week     rarely      Review of Systems  Neurological: Positive for headaches.  All other systems reviewed and are negative.    Allergies  Penicillins  Home Medications   Current Outpatient Rx  Name Route Sig Dispense Refill  .  ACETAMINOPHEN 500 MG PO TABS Oral Take 1,000-1,500 mg by mouth every 6 (six) hours as needed. For pain    . ASPIRIN 325 MG PO TABS Oral Take 325 mg by mouth 2 (two) times daily.    . CO Q 10 PO Oral Take 1 tablet by mouth daily.    . IBUPROFEN 200 MG PO TABS Oral Take 400 mg by mouth every 6 (six) hours as needed. For pain    . LORATADINE 10 MG PO TABS Oral Take 10 mg by mouth daily.    [name] METOPROLOL TARTRATE 100 MG PO TABS Oral Take 0.5 tablets (50 mg total) by mouth 2 (two) times daily. 30 tablet 0  . ADULT MULTIVITAMIN W/MINERALS CH Oral Take 1 tablet by mouth daily. Nature's Made for men    . CHOLEST OFF PO Oral Take 2 tablets by mouth 2 (two) times daily.    [name] RAMIPRIL 5 MG PO CAPS Oral Take 10 mg by mouth daily.    [name] SALINE 0.65 % NA SOLN Nasal Place 1 spray into the nose 3 (three) times daily as needed. For Nasal congestion      BP 155/114  Pulse 78  Temp 97.9 F (36.6 C) (Oral)  Resp 19  SpO2 98%  Physical Exam  Nursing note and vitals reviewed. Constitutional: He is oriented to  person, place, and time. He appears well-developed and well-nourished.  HENT:  Head: Normocephalic and atraumatic.  Eyes: EOM are normal. Pupils are equal, round, and reactive to light.  Neck: Normal range of motion.  Cardiovascular: Normal rate, regular rhythm, normal heart sounds and intact distal pulses.   Pulmonary/Chest: Effort normal and breath sounds normal. No respiratory distress.  Abdominal: Soft. He exhibits no distension. There is no tenderness.  Musculoskeletal: Normal range of motion.  Neurological: He is alert and oriented to person, place, and time.       5/5 strength in major muscle groups of  bilateral upper and lower extremities. Speech normal. No facial asymetry.   Skin: Skin is warm and dry.  Psychiatric: He has a normal mood and affect. Judgment normal.    ED Course  Procedures (including critical care time)  Labs Reviewed - No data to display Ct Head Wo  Contrast  11/02/2011  *RADIOLOGY REPORT*  Clinical Data: Headache  CT HEAD WITHOUT CONTRAST  Technique:  Contiguous axial images were obtained from the base of the skull through the vertex without contrast.  Comparison: 10/17/2011  Findings: There is no evidence for acute hemorrhage, hydrocephalus, mass lesion, or abnormal extra-axial fluid collection.  No definite CT evidence for acute infarction.  The visualized paranasal sinuses and mastoid air cells are predominately clear.  IMPRESSION: No acute intracranial abnormality.   Original Report Authenticated By: Waneta Martins, M.D.     I personally reviewed the imaging tests through PACS system  I reviewed available ER/hospitalization records thought the EMR  1. Headache   2. Hypertension       MDM  2:11 AM Patient is feeling somewhat better at this time.  He has a normal neurologic exam.  CT head is normal.  His blood pressure is 151/101.  He is following closely with his cardiologist regarding his blood pressure management.  I will not make any changes in his blood pressure management now.  He does now report that he did not take his metoprolol today.  Close followup with his cardiologist.  He does not have a primary care physician at this time for which I recommended that he develop a relationship with one.  He understands to return the emergency apartment for new or worsening symptoms        Lyanne Co, MD 11/02/11 [phone]

## 2011-11-09 ENCOUNTER — Encounter (HOSPITAL_COMMUNITY): Payer: Self-pay | Admitting: Physical Medicine and Rehabilitation

## 2011-11-09 ENCOUNTER — Emergency Department (HOSPITAL_COMMUNITY)
Admission: EM | Admit: 2011-11-09 | Discharge: 2011-11-10 | Disposition: A | Discharge status: Home / Self Care | Payer: Medicaid Other | Attending: Emergency Medicine | Admitting: Emergency Medicine

## 2011-11-09 ENCOUNTER — Emergency Department (HOSPITAL_COMMUNITY): Payer: Medicaid Other

## 2011-11-09 DIAGNOSIS — Z79899 Other long term (current) drug therapy: Secondary | ICD-10-CM | POA: Insufficient documentation

## 2011-11-09 DIAGNOSIS — Z7982 Long term (current) use of aspirin: Secondary | ICD-10-CM | POA: Insufficient documentation

## 2011-11-09 DIAGNOSIS — R51 Headache: Secondary | ICD-10-CM | POA: Insufficient documentation

## 2011-11-09 DIAGNOSIS — R110 Nausea: Secondary | ICD-10-CM | POA: Insufficient documentation

## 2011-11-09 DIAGNOSIS — I10 Essential (primary) hypertension: Secondary | ICD-10-CM | POA: Insufficient documentation

## 2011-11-09 DIAGNOSIS — M542 Cervicalgia: Secondary | ICD-10-CM | POA: Insufficient documentation

## 2011-11-09 MED ORDER — METOCLOPRAMIDE HCL 5 MG/ML IJ SOLN
10.0000 mg | Freq: Once | INTRAMUSCULAR | Status: AC
  Administered 2011-11-09: 10 mg via INTRAVENOUS
  Filled 2011-11-09: qty 2

## 2011-11-09 MED ORDER — DEXAMETHASONE SODIUM PHOSPHATE 10 MG/ML IJ SOLN
10.0000 mg | Freq: Once | INTRAMUSCULAR | Status: AC
  Administered 2011-11-09: 10 mg via INTRAVENOUS
  Filled 2011-11-09: qty 1

## 2011-11-09 MED ORDER — OXYCODONE-ACETAMINOPHEN 5-325 MG PO TABS: 2.0000 | ORAL_TABLET | Freq: Once | ORAL | Status: DC

## 2011-11-09 MED ORDER — ENALAPRIL MALEATE 10 MG PO TABS
10.0000 mg | ORAL_TABLET | Freq: Every day | ORAL | Status: DC
  Administered 2011-11-09: 10 mg via ORAL
  Filled 2011-11-09: qty 1

## 2011-11-09 MED ORDER — KETOROLAC TROMETHAMINE 30 MG/ML IJ SOLN
30.0000 mg | Freq: Once | INTRAMUSCULAR | Status: AC
  Administered 2011-11-09: 30 mg via INTRAVENOUS
  Filled 2011-11-09: qty 1

## 2011-11-09 MED ORDER — METOPROLOL TARTRATE 25 MG PO TABS
100.0000 mg | ORAL_TABLET | Freq: Once | ORAL | Status: AC
  Administered 2011-11-09: 100 mg via ORAL
  Filled 2011-11-09 (×2): qty 4
  Filled 2011-11-09: qty 1

## 2011-11-09 MED ORDER — DIPHENHYDRAMINE HCL 50 MG/ML IJ SOLN
25.0000 mg | Freq: Once | INTRAMUSCULAR | Status: AC
  Administered 2011-11-09: 25 mg via INTRAVENOUS
  Filled 2011-11-09: qty 1

## 2011-11-09 MED ORDER — METHOCARBAMOL 100 MG/ML IJ SOLN
1000.0000 mg | Freq: Once | INTRAVENOUS | Status: AC
  Administered 2011-11-09: 1000 mg via INTRAVENOUS
  Filled 2011-11-09: qty 10

## 2011-11-09 MED ORDER — METHOCARBAMOL 100 MG/ML IJ SOLN
1000.0000 mg | Freq: Once | INTRAMUSCULAR | Status: DC
  Filled 2011-11-09: qty 10

## 2011-11-09 NOTE — ED Provider Notes (Signed)
History     CSN: [id number]  Arrival date & time 11/09/11  1749   First MD Initiated Contact with Patient 11/09/11 2052      Chief Complaint  Patient presents with  . Headache  . Neck Pain    (Consider location/radiation/quality/duration/timing/severity/associated sxs/prior treatment) HPI Comments: Isaac Goodwin 41 y.o. male   The chief complaint is: Patient presents with:   Headache   Neck Pain   The patient has medical history significant for:   Past Medical History:   Hypertension                                                 Hypercholesteremia                                           Sleep apnea                                                  Sinus headache                                               Coronary artery disease                                     Patient presents with hypertension and headache that awoke him from sleep today. He states that he has been to the ED on multiple occasions for this headache and uncontrolled hypertension thinking that there must be a correlation. He states that during these visits he had a CT of his sinuses and head that were unremarkable for sinusitis or intercranial process. Patient states the headache is 10/10, changes with position of his neck, and he often hears a popping sound similar to a "carrot snapping" when he turn his neck. Associated symptoms include left eye pain, nausea, and photophobia. His headache starts at the occiput and radiates forward. Denies fever or chills. Denies vomiting or diarrhea. Denies CP or SOB. Denies that this is the worst headache of his life.      The history is provided by the patient and the spouse. No language interpreter was used.    Past Medical History  Diagnosis Date  . Hypertension   . Hypercholesteremia   . Sleep apnea   . Sinus headache   . Coronary artery disease     Past Surgical History  Procedure Date  . Coronary stent placement     4    History reviewed. No  pertinent family history.  History  Substance Use Topics  . Smoking status: Current Some Day Smoker    Types: Cigarettes  . Smokeless tobacco: Not on file  . Alcohol Use: 2.4 oz/week    4 Glasses of wine per week     rarely      Review of Systems  Constitutional: Negative for fever and chills.  Eyes: Positive for pain. Negative for visual disturbance.  Respiratory: Negative for shortness of breath.   Gastrointestinal: Positive for nausea. Negative for vomiting and diarrhea.    Allergies  Penicillins  Home Medications   Current Outpatient Rx  Name Route Sig Dispense Refill  . ASPIRIN 325 MG PO TABS Oral Take 325 mg by mouth 2 (two) times daily.    . BUPROPION HCL ER (XL) 150 MG PO TB24 Oral Take 150 mg by mouth daily.    . CO Q 10 PO Oral Take 1 tablet by mouth daily.    [name] LORATADINE 10 MG PO TABS Oral Take 10 mg by mouth daily.    [name] METOPROLOL TARTRATE 100 MG PO TABS Oral Take 0.5 tablets (50 mg total) by mouth 2 (two) times daily. 30 tablet 0  . ADULT MULTIVITAMIN W/MINERALS CH Oral Take 1 tablet by mouth daily. Nature's Made for men    . OXYCODONE-ACETAMINOPHEN 5-325 MG PO TABS Oral Take 1 tablet by mouth every 4 (four) hours as needed. For pain    . CHOLEST OFF PO Oral Take 2 tablets by mouth 2 (two) times daily.    [name] RAMIPRIL 5 MG PO CAPS Oral Take 10 mg by mouth daily.      BP 137/107  Pulse 74  Temp 97.7 F (36.5 C) (Oral)  Resp 14  SpO2 96%  Physical Exam  Nursing note and vitals reviewed. Constitutional: He appears well-developed and well-nourished. He appears distressed.  HENT:  Head: Normocephalic and atraumatic.  Mouth/Throat: Oropharynx is clear and moist.       Patient tender to palpation of the lateral neck bilaterally and occiput.  Mild tenderness to palpation of the left temple, no palpable cord or temporal bruits.  Eyes: Conjunctivae normal and EOM are normal. Pupils are equal, round, and reactive to light. No scleral icterus.       Patient  sensitive to light during exam.  Neck: Normal range of motion. Neck supple.  Cardiovascular: Regular rhythm and normal heart sounds.        Patient is tachycardic on exam.  No carotid bruits.  Pulmonary/Chest: Effort normal and breath sounds normal.  Abdominal: Soft. Bowel sounds are normal. There is no tenderness.  Neurological: He is alert. No cranial nerve deficit. He exhibits normal muscle tone. Coordination normal.       Cranial nerves II-XII gross intact. Good finger to nose.    ED Course  Procedures (including critical care time)  Labs Reviewed - No data to display Ct Cervical Spine Wo Contrast  11/09/2011  *RADIOLOGY REPORT*  Clinical Data: Neck pain  CT CERVICAL SPINE WITHOUT CONTRAST  Technique:  Multidetector CT imaging of the cervical spine was performed. Multiplanar CT image reconstructions were also generated.  Comparison: 09/29/2003.  Findings:  There is straightening of normal cervical lordosis. The       vertebral body heights are well preserved.  There is mild       disc space narrowing and posterior spur formation at the C5-6       level.  The facet joints are aligned.  No fractures or       subluxations identified.  The prevertebral soft tissue space       is normal.        IMPRESSION:  1.  Straightening of normal cervical lordosis. 2.  Degenerative disc disease.   Original Report Authenticated By: Rosealee Albee, M.D.      1. Headache   2. Neck pain       MDM  Patient  presented with headache that has not resolved and has no apparent cause from prior ED visits. Patient was seen by Dr. Norlene Campbell on 11/01/11 and Dr. Patria Mane on 11/09/11. On those visits CT of sinuses and CT head were unremarkable. No neurological deficits. Patient given headache cocktail and muscle relaxer as there seems to be a musculoskeletal/radicular component. Patient reassessed and pain resolved. CT: neck remarkable for disc space narrowing and spur formation at C5-C6 level and degenerative disc disease.  Patient informed of results and discharged on pain medication and muscle relaxer with recommendation to follow-up with primary care. No red flags for meningitis, subarachnoid, or acute intercranial process. Return precautions given verbally and in discharge summary.        Pixie Casino, PA-C 11/10/11 [phone]

## 2011-11-09 NOTE — ED Notes (Signed)
Pt presents to department for evaluation of neck and head pain. Pt states chronic issues with same for several weeks. States "when I move my neck in a certain position my head hurts." 8/10 pain at the time. He is alert and oriented x4. No neurological deficits. States nausea at the time.

## 2011-11-09 NOTE — ED Notes (Signed)
Pt outside unable to update vs

## 2011-11-09 NOTE — ED Notes (Signed)
Patient says he has had a headache before and this is more than a headache.  Patient says he has had a CT of his sinuses and they cannot find the cause of his head hurting.  Patient was here last week for the same reason and it has not resolved.  Patient did say he is a heart patient and had a heart attack in the past and had bypass surgery.  Patient describes his head hurting him so bad it cannot be called a "headache".

## 2011-11-10 MED ORDER — CYCLOBENZAPRINE HCL 10 MG PO TABS: 10.0000 mg | ORAL_TABLET | Freq: Two times a day (BID) | ORAL | Status: DC | PRN

## 2011-11-10 MED ORDER — OXYCODONE-ACETAMINOPHEN 5-325 MG PO TABS: 1.0000 | ORAL_TABLET | Freq: Four times a day (QID) | ORAL | Status: DC | PRN

## 2011-11-10 MED ORDER — OXYCODONE-ACETAMINOPHEN 5-325 MG PO TABS
2.0000 | ORAL_TABLET | Freq: Once | ORAL | Status: AC
  Administered 2011-11-10: 2 via ORAL
  Filled 2011-11-10: qty 2

## 2011-11-10 NOTE — ED Provider Notes (Signed)
Medical screening examination/treatment/procedure(s) were conducted as a shared visit with non-physician practitioner(s) and myself.  I personally evaluated the patient during the encounter.  Headache with no neuro deficits.  CT C-spine shows degenerative disc disease. Discussed elevated blood pressure.  Donnetta Hutching, MD 11/10/11 [phone]

## 2011-11-10 NOTE — ED Notes (Addendum)
Patient is alert and oriented x4.  Patient has read his discharge instructions and has no questions.  Patient's wife is transporting patient home.  Patient declined a wheelchair.

## 2012-04-22 ENCOUNTER — Encounter: Payer: Self-pay | Admitting: *Deleted

## 2012-05-18 ENCOUNTER — Encounter: Payer: Self-pay | Admitting: Cardiovascular Disease

## 2012-05-26 ENCOUNTER — Encounter (HOSPITAL_COMMUNITY): Payer: Self-pay | Admitting: Emergency Medicine

## 2012-05-26 ENCOUNTER — Emergency Department (HOSPITAL_COMMUNITY)
Admission: EM | Admit: 2012-05-26 | Discharge: 2012-05-26 | Disposition: A | Discharge status: Home / Self Care | Payer: Self-pay | Attending: Emergency Medicine | Admitting: Emergency Medicine

## 2012-05-26 DIAGNOSIS — Y9364 Activity, baseball: Secondary | ICD-10-CM | POA: Insufficient documentation

## 2012-05-26 DIAGNOSIS — Z79899 Other long term (current) drug therapy: Secondary | ICD-10-CM | POA: Insufficient documentation

## 2012-05-26 DIAGNOSIS — S76012A Strain of muscle, fascia and tendon of left hip, initial encounter: Secondary | ICD-10-CM

## 2012-05-26 DIAGNOSIS — F17200 Nicotine dependence, unspecified, uncomplicated: Secondary | ICD-10-CM | POA: Insufficient documentation

## 2012-05-26 DIAGNOSIS — Z9861 Coronary angioplasty status: Secondary | ICD-10-CM | POA: Insufficient documentation

## 2012-05-26 DIAGNOSIS — Y9239 Other specified sports and athletic area as the place of occurrence of the external cause: Secondary | ICD-10-CM | POA: Insufficient documentation

## 2012-05-26 DIAGNOSIS — I2510 Atherosclerotic heart disease of native coronary artery without angina pectoris: Secondary | ICD-10-CM | POA: Insufficient documentation

## 2012-05-26 DIAGNOSIS — I10 Essential (primary) hypertension: Secondary | ICD-10-CM | POA: Insufficient documentation

## 2012-05-26 DIAGNOSIS — I252 Old myocardial infarction: Secondary | ICD-10-CM | POA: Insufficient documentation

## 2012-05-26 DIAGNOSIS — X500XXA Overexertion from strenuous movement or load, initial encounter: Secondary | ICD-10-CM | POA: Insufficient documentation

## 2012-05-26 DIAGNOSIS — Z7982 Long term (current) use of aspirin: Secondary | ICD-10-CM | POA: Insufficient documentation

## 2012-05-26 DIAGNOSIS — E7800 Pure hypercholesterolemia, unspecified: Secondary | ICD-10-CM | POA: Insufficient documentation

## 2012-05-26 DIAGNOSIS — IMO0002 Reserved for concepts with insufficient information to code with codable children: Secondary | ICD-10-CM | POA: Insufficient documentation

## 2012-05-26 MED ORDER — HYDROCODONE-ACETAMINOPHEN 5-325 MG PO TABS: 1.0000 | ORAL_TABLET | Freq: Four times a day (QID) | ORAL | Status: DC | PRN

## 2012-05-26 MED ORDER — OXYCODONE-ACETAMINOPHEN 5-325 MG PO TABS
1.0000 | ORAL_TABLET | Freq: Once | ORAL | Status: AC
  Administered 2012-05-26: 1 via ORAL
  Filled 2012-05-26: qty 1

## 2012-05-26 MED ORDER — DEXAMETHASONE SODIUM PHOSPHATE 10 MG/ML IJ SOLN
10.0000 mg | Freq: Once | INTRAMUSCULAR | Status: AC
  Administered 2012-05-26: 10 mg via INTRAMUSCULAR
  Filled 2012-05-26: qty 1

## 2012-05-26 MED ORDER — PREDNISONE 50 MG PO TABS: 50.0000 mg | ORAL_TABLET | Freq: Every day | ORAL | Status: DC

## 2012-05-26 NOTE — ED Provider Notes (Signed)
Medical screening examination/treatment/procedure(s) were performed by non-physician practitioner and as supervising physician I was immediately available for consultation/collaboration.  Juliet Rude. Rubin Payor, MD 05/26/12 [phone]

## 2012-05-26 NOTE — ED Notes (Signed)
Pt c/o left hip pain; pt denies obvious injury and sts pain radiates down left leg; pt sts worse with movement

## 2012-05-26 NOTE — ED Provider Notes (Signed)
History     CSN: [id number]  Arrival date & time 05/26/12  7829   First MD Initiated Contact with Patient 05/26/12 1017      Chief Complaint  Patient presents with  . Hip Pain    (Consider location/radiation/quality/duration/timing/severity/associated sxs/prior treatment) HPI Patient presents to the emergency department with left hip pain.  Patient, states he was playing baseball with his daughter when he bent over to pick up the ball twisting his left hip.  Patient, states, that he has pain with outward motion of his leg.  Patient, states, that certain positions make the pain, better.  He states that walking helps alleviate the pain as well.  Patient denies numbness, weakness, back pain, abdominal pain, nausea, vomiting, fever, or leg swelling.  Patient, states, that one specific area of tenderness along the lateral hip.  Patient, states he tried over-the-counter medications without relief      Past Medical History  Diagnosis Date  . Hypertension   . Hypercholesteremia   . Sleep apnea   . Sinus headache   . Coronary artery disease   . Non-STEMI (non-ST elevated myocardial infarction) 10/19/07    successful cutting balloon atherotomy/stenting cypher stent 3.0x17mm just beyond ostium prox. in the RCA. 10/23/07 cypher stents placed in LAD & CX  . OSA (obstructive sleep apnea)     Past Surgical History  Procedure Laterality Date  . Coronary stent placement  10/23/07    successful PTCA/stenting of 90% LAD cypher stent 3.0x12mm taper of 3.4-3.54mm, left cx obtuse marginal 1 cypher stent 2.5x34mm   . Coronary stent placement  10/19/07    successful cutting balloon atherotomy/stenting cypher 3.0x90mm just beyond the ostium prox. in the RCA    History reviewed. No pertinent family history.  History  Substance Use Topics  . Smoking status: Current Some Day Smoker    Types: Cigarettes  . Smokeless tobacco: Not on file  . Alcohol Use: 2.4 oz/week    4 Glasses of wine per week   Comment: rarely      Review of Systems All other systems negative except as documented in the HPI. All pertinent positives and negatives as reviewed in the HPI. Allergies  Penicillins  Home Medications   Current Outpatient Rx  Name  Route  Sig  Dispense  Refill  . aspirin 325 MG tablet   Oral   Take 325 mg by mouth daily.          [name] buPROPion (WELLBUTRIN XL) 150 MG 24 hr tablet   Oral   Take 150 mg by mouth daily.         . Coenzyme Q10 (CO Q 10 PO)   Oral   Take 1 tablet by mouth daily.         . enalapril (VASOTEC) 10 MG tablet   Oral   Take 10 mg by mouth daily.         . fish oil-omega-3 fatty acids 1000 MG capsule   Oral   Take 2 g by mouth daily.         [name] loratadine (CLARITIN) 10 MG tablet   Oral   Take 10 mg by mouth daily as needed for allergies.          . Menthol-Methyl Salicylate (ICY HOT BALM EXTRA STRENGTH EX)   Apply externally   Apply 1 application topically every 4 (four) hours as needed (for muscle aches).         . metoprolol (LOPRESSOR) 100 MG tablet  Oral   Take 100 mg by mouth 2 (two) times daily.         . Multiple Vitamin (MULTIVITAMIN WITH MINERALS) TABS   Oral   Take 1 tablet by mouth daily as needed (for vitamin). Nature's Made for men         . Plant Sterols and Stanols (CHOLEST OFF PO)   Oral   Take 2 tablets by mouth 2 (two) times daily.           BP 138/95  Pulse 80  Temp(Src) 98.6 F (37 C) (Oral)  Resp 18  SpO2 95%  Physical Exam  Nursing note and vitals reviewed. Constitutional: He is oriented to person, place, and time. He appears well-developed and well-nourished. No distress.  HENT:  Head: Normocephalic and atraumatic.  Cardiovascular: Regular rhythm.   Musculoskeletal: He exhibits no edema.       Left hip: He exhibits tenderness. He exhibits normal range of motion, normal strength, no bony tenderness, no swelling, no crepitus and no deformity.       Legs: Neurological: He is alert and  oriented to person, place, and time. He displays normal reflexes. He exhibits normal muscle tone. Coordination normal.  Skin: Skin is warm and dry. No rash noted. No erythema.    ED Course  Procedures (including critical care time)  The patient most likely has adductor strain of his left hip.  Patient has point tenderness along the specific area of his left hip laterally and a little posterior.  The patient will be treated for this strain and referred to orthopedics.  He is told to use ice and heat on his hip.  Told to return here as needed   MDM          Carlyle Dolly, PA-C 05/26/12 1112

## 2012-07-31 ENCOUNTER — Other Ambulatory Visit: Payer: Self-pay | Admitting: Cardiology

## 2012-07-31 NOTE — Telephone Encounter (Signed)
Please call his medicine today-Pt states that he will be in trouble-does not have any left-Please call it in today!

## 2012-08-03 NOTE — Telephone Encounter (Signed)
Please advise on Buproprion refills

## 2012-08-03 NOTE — Telephone Encounter (Signed)
Message sent to Dr. [name] for authorization on Buproprion

## 2012-08-04 MED ORDER — BUPROPION HCL ER (XL) 150 MG PO TB24: 150.0000 mg | ORAL_TABLET | Freq: Every day | ORAL | Status: DC

## 2012-08-04 NOTE — Telephone Encounter (Signed)
Refill of Buproprion authorizaed by Dr. [name] #30 w/0 refills.  Sent to [facility].

## 2012-08-04 NOTE — Addendum Note (Signed)
Addended by: Ronnell Guadalajara A on: 08/04/2012 08:53 AM   Modules accepted: Orders

## 2012-09-16 ENCOUNTER — Other Ambulatory Visit: Payer: Self-pay | Admitting: Cardiovascular Disease

## 2012-09-16 NOTE — Telephone Encounter (Signed)
Rx was sent to pharmacy electronically. 

## 2012-10-21 ENCOUNTER — Other Ambulatory Visit: Payer: Self-pay | Admitting: Cardiovascular Disease

## 2012-10-22 NOTE — Telephone Encounter (Signed)
Rx was sent to pharmacy electronically. 

## 2013-03-12 ENCOUNTER — Other Ambulatory Visit: Payer: Self-pay | Admitting: Cardiovascular Disease

## 2013-03-12 ENCOUNTER — Other Ambulatory Visit: Payer: Self-pay | Admitting: Cardiology

## 2013-03-12 NOTE — Telephone Encounter (Signed)
Rx was sent to pharmacy electronically. 

## 2013-04-23 ENCOUNTER — Other Ambulatory Visit: Payer: Self-pay | Admitting: Cardiovascular Disease

## 2013-04-26 ENCOUNTER — Other Ambulatory Visit: Payer: Self-pay | Admitting: *Deleted

## 2013-04-26 MED ORDER — BUPROPION HCL ER (XL) 150 MG PO TB24: ORAL_TABLET | ORAL | Status: DC

## 2013-04-26 MED ORDER — ENALAPRIL MALEATE 10 MG PO TABS: ORAL_TABLET | ORAL | Status: DC

## 2013-04-26 NOTE — Telephone Encounter (Signed)
Rx was sent to pharmacy electronically. 

## 2013-05-16 ENCOUNTER — Other Ambulatory Visit: Payer: Self-pay | Admitting: Cardiovascular Disease

## 2013-05-17 NOTE — Telephone Encounter (Signed)
Rx was sent to pharmacy electronically. 

## 2013-06-06 ENCOUNTER — Other Ambulatory Visit: Payer: Self-pay | Admitting: Cardiovascular Disease

## 2013-06-07 NOTE — Telephone Encounter (Signed)
Rx was sent to pharmacy electronically. 

## 2013-06-30 ENCOUNTER — Other Ambulatory Visit: Payer: Self-pay | Admitting: Cardiovascular Disease

## 2013-06-30 NOTE — Telephone Encounter (Signed)
Rx refill sent to patient pharmacy   

## 2013-06-30 NOTE — Telephone Encounter (Signed)
Please call-concerning the quainty of his prescription.

## 2013-07-21 ENCOUNTER — Encounter: Payer: Self-pay | Admitting: Cardiovascular Disease

## 2013-07-21 ENCOUNTER — Ambulatory Visit (INDEPENDENT_AMBULATORY_CARE_PROVIDER_SITE_OTHER): Payer: Medicaid Other | Admitting: Cardiovascular Disease

## 2013-07-21 VITALS — BP 138/86 | HR 77 | Ht 77.0 in | Wt 270.4 lb

## 2013-07-21 DIAGNOSIS — Z720 Tobacco use: Secondary | ICD-10-CM

## 2013-07-21 DIAGNOSIS — M47812 Spondylosis without myelopathy or radiculopathy, cervical region: Secondary | ICD-10-CM

## 2013-07-21 DIAGNOSIS — I2510 Atherosclerotic heart disease of native coronary artery without angina pectoris: Secondary | ICD-10-CM

## 2013-07-21 DIAGNOSIS — I214 Non-ST elevation (NSTEMI) myocardial infarction: Secondary | ICD-10-CM

## 2013-07-21 DIAGNOSIS — F411 Generalized anxiety disorder: Secondary | ICD-10-CM

## 2013-07-21 DIAGNOSIS — F419 Anxiety disorder, unspecified: Secondary | ICD-10-CM

## 2013-07-21 DIAGNOSIS — R5383 Other fatigue: Secondary | ICD-10-CM

## 2013-07-21 DIAGNOSIS — R5381 Other malaise: Secondary | ICD-10-CM

## 2013-07-21 DIAGNOSIS — I219 Acute myocardial infarction, unspecified: Secondary | ICD-10-CM

## 2013-07-21 DIAGNOSIS — E782 Mixed hyperlipidemia: Secondary | ICD-10-CM

## 2013-07-21 DIAGNOSIS — F17200 Nicotine dependence, unspecified, uncomplicated: Secondary | ICD-10-CM

## 2013-07-21 MED ORDER — BUPROPION HCL ER (XL) 150 MG PO TB24: ORAL_TABLET | ORAL | Status: DC

## 2013-07-21 NOTE — Patient Instructions (Signed)
Your physician recommends that you return for lab work fasting.  Your physician has recommended you make the following change in your medication: decrease the aspirin to 81 mg.  Your physician recommends that you schedule a follow-up appointment in: 1 year.

## 2013-07-28 ENCOUNTER — Encounter: Payer: Self-pay | Admitting: Cardiovascular Disease

## 2013-07-28 DIAGNOSIS — F419 Anxiety disorder, unspecified: Secondary | ICD-10-CM | POA: Insufficient documentation

## 2013-07-28 DIAGNOSIS — I214 Non-ST elevation (NSTEMI) myocardial infarction: Secondary | ICD-10-CM | POA: Insufficient documentation

## 2013-07-28 DIAGNOSIS — Z720 Tobacco use: Secondary | ICD-10-CM | POA: Insufficient documentation

## 2013-07-28 DIAGNOSIS — M47812 Spondylosis without myelopathy or radiculopathy, cervical region: Secondary | ICD-10-CM | POA: Insufficient documentation

## 2013-07-28 DIAGNOSIS — I2510 Atherosclerotic heart disease of native coronary artery without angina pectoris: Secondary | ICD-10-CM | POA: Insufficient documentation

## 2013-07-28 NOTE — Progress Notes (Signed)
Patient ID: Isaac Goodwin, male   DOB: 1970/08/29, 43 y.o.   MRN: [id number]     HPI: Isaac Goodwin is a 43 y.o. male who presents to the office today for a 20 month follow up cardiology evaluation.  Isaac Goodwin has known coronary artery disease in August 2009 7 and acute coronary syndrome and ruled in for non-ST segment elevation MI.  He was found to have three-vessel CAD, and it was felt that his RCA was the culprit vessel.  He underwent successful PCI to his RCA with cutting balloon and stenting and insertion of a 3.0x13 mm Cypher stent.  He subsequently underwent staged 2 vessel intervention involving the LAD and circumflex vessel with Cypher stents.   additional problems include hypertension, hyperlipidemia, tobacco use, and degenerative joint disease involving C5 and C6.  His last nuclear perfusion study was in October 2013, which showed normal perfusion without scar or ischemia, ejection fraction 56%.  Renal duplex imaging showed normal renal arteries.  Over the past 2 years, he has remained fairly stable.  He tells me he was hospitalized on 4 incidences with noncardiac issues.  He still smokes, but significantly less than the previous 2 pack per day habit to less than one half pack per day.  He denies recent chest pain.  He denies change in exercise tolerance.  He denies PND or orthopnea.  There are no palpitations.  Past Medical History  Diagnosis Date  . Hypertension   . Hypercholesteremia   . Sleep apnea   . Sinus headache   . Coronary artery disease   . Non-STEMI (non-ST elevated myocardial infarction) 10/19/07    successful cutting balloon atherotomy/stenting cypher stent 3.0x48mm just beyond ostium prox. in the RCA. 10/23/07 cypher stents placed in LAD & CX  . OSA (obstructive sleep apnea)     Past Surgical History  Procedure Laterality Date  . Coronary stent placement  10/23/07    successful PTCA/stenting of 90% LAD cypher stent 3.0x3mm taper of 3.4-3.22mm, left cx obtuse marginal  1 cypher stent 2.5x63mm   . Coronary stent placement  10/19/07    successful cutting balloon atherotomy/stenting cypher 3.0x69mm just beyond the ostium prox. in the RCA    Allergies  Allergen Reactions  . Penicillins Anaphylaxis    Current Outpatient Prescriptions  Medication Sig Dispense Refill  . aspirin 81 MG tablet Take 81 mg by mouth daily.      [name] buPROPion (WELLBUTRIN XL) 150 MG 24 hr tablet TAKE ONE TABLET BY MOUTH ONE TIME DAILY  30 tablet  11  . Coenzyme Q10 (CO Q 10 PO) Take 1 tablet by mouth daily.      . enalapril (VASOTEC) 10 MG tablet TAKE ONE TABLET BY MOUTH ONE TIME DAILY   30 tablet  0  . fish oil-omega-3 fatty acids 1000 MG capsule Take 2 g by mouth daily.      [name] loratadine (CLARITIN) 10 MG tablet Take 10 mg by mouth daily as needed for allergies.       . Menthol-Methyl Salicylate (ICY HOT BALM EXTRA STRENGTH EX) Apply 1 application topically every 4 (four) hours as needed (for muscle aches).      . metoprolol (LOPRESSOR) 100 MG tablet Take 1 tablet (100 mg total) by mouth 2 (two) times daily. <please make appointment for refills>  30 tablet  0  . Multiple Vitamin (MULTIVITAMIN WITH MINERALS) TABS Take 1 tablet by mouth daily as needed (for vitamin). Nature's Made for men      .  Plant Sterols and Stanols (CHOLEST OFF PO) Take 2 tablets by mouth 2 (two) times daily.       No current facility-administered medications for this visit.    History   Social History  . Marital Status: Divorced    Spouse Name: N/A    Number of Children: N/A  . Years of Education: N/A   Occupational History  . Not on file.   Social History Main Topics  . Smoking status: Current Some Day Smoker    Types: Cigarettes  . Smokeless tobacco: Never Used  . Alcohol Use: 2.4 oz/week    4 Glasses of wine per week     Comment: rarely  . Drug Use: No  . Sexual Activity: Not on file   Other Topics Concern  . Not on file   Social History Narrative  . No narrative on file    History  reviewed. No pertinent family history.  ROS General: Negative; No fevers, chills, or night sweats HEENT: Negative; No changes in vision or hearing, sinus congestion, difficulty swallowing Pulmonary: Negative; No cough, wheezing, shortness of breath, hemoptysis Cardiovascular: See HPI: No chest pain, presyncope, syncope, palpatations GI: Negative; No nausea, vomiting, diarrhea, or abdominal pain GU: Negative; No dysuria, hematuria, or difficulty voiding Musculoskeletal: Negative; no myalgias, joint pain, or weakness Hematologic: Negative; no easy bruising, bleeding Endocrine: Negative; no heat/cold intolerance; no diabetes, Neuro: Negative; no changes in balance, headaches Skin: Negative; No rashes or skin lesions Psychiatric: Negative; No behavioral problems, depression Sleep: Negative; No snoring,  daytime sleepiness, hypersomnolence, bruxism, restless legs, hypnogognic hallucinations. Other comprehensive 14 point system review is negative   Physical Exam BP 138/86  Pulse 77  Ht 6\' 5"  (1.956 m)  Wt 270 lb 6.4 oz (122.653 kg)  BMI 32.06 kg/m2 General: Alert, oriented, no distress.  Skin: normal turgor, no rashes, warm and dry HEENT: Normocephalic, atraumatic. Pupils equal round and reactive to light; sclera anicteric; extraocular muscles intact, No lid lag; Nose without nasal septal hypertrophy; Mouth/Parynx benign; Mallinpatti scale 2 Neck: No JVD, no carotid bruits; normal carotid upstroke Lungs: clear to ausculatation and percussion bilaterally; no wheezing or rales, normal inspiratory and expiratory effort Chest wall: without tenderness to palpitation Heart: PMI not displaced, RRR, s1 s2 normal, 1/6systolic murmur, No diastolic murmur, no rubs, gallops, thrills, or heaves Abdomen: soft, nontender; no hepatosplenomehaly, BS+; abdominal aorta nontender and not dilated by palpation. Back: no CVA tenderness Pulses: 2+  Musculoskeletal: full range of motion, normal strength, no  joint deformities Extremities: Pulses 2+, no clubbing cyanosis or edema, Homan's sign negative  Neurologic: grossly nonfocal; Cranial nerves grossly wnl Psychologic: Normal mood and affect   ECG (independently read by me): Sinus rhythm at 77 beats per minute.  No ectopy.  PR interval 156 ms; QTc interval 443 ms.  LABS:  BMET    Component Value Date/Time   NA 135 10/17/2011 0213   K 2.9* 10/17/2011 0213   CL 96 10/17/2011 0213   CO2 29 10/17/2011 0213   GLUCOSE 109* 10/17/2011 0213   BUN 17 10/17/2011 0213   CREATININE 0.96 10/17/2011 0213   CALCIUM 9.9 10/17/2011 0213   GFRNONAA >90 10/17/2011 0213   GFRAA >90 10/17/2011 0213     Hepatic Function Panel  No results found for this basename: prot, albumin, ast, alt, alkphos, bilitot, bilidir, ibili     CBC    Component Value Date/Time   WBC 10.8* 10/17/2011 0213   RBC 5.53 10/17/2011 0213   HGB 17.8* 10/17/2011 16100213  HCT 48.4 10/17/2011 0213   PLT 219 10/17/2011 0213   MCV 87.5 10/17/2011 0213   MCH 32.2 10/17/2011 0213   MCHC 36.8* 10/17/2011 0213   RDW 12.3 10/17/2011 0213   LYMPHSABS 2.4 10/17/2011 0213   MONOABS 0.8 10/17/2011 0213   EOSABS 0.3 10/17/2011 0213   BASOSABS 0.1 10/17/2011 0213     BNP No results found for this basename: probnp    Lipid Panel  No results found for this basename: chol, trig, hdl, cholhdl, vldl, ldlcalc     RADIOLOGY: No results found.    ASSESSMENT AND PLAN: Isaac Goodwin suffered an acute coronary syndrome in August 2009 and underwent culprit PCI to his RCA with staged intervention to his LAD and circumflex vessels.  Presently, his blood pressure today is fairly well-controlled on metoprolol 100 mg twice a day in addition to his enalapril 10 mg.  He is taking fish oil supplementation.  He also has taken Wellbutrin to help with smoking cessation and anxiety.  I have recommended he reduce his aspirin dose to 81 mg.  A complete set of blood work will be obtained in the fasting state.  I had  long discussion with him concerning the absolute importance of complete discontinuance of all tobacco products.  Adjustments will be made to his medical regimen depending upon his laboratory work. As long as he remains stable, I will see him in one year for followup evaluation.  Time spent: 25 minutes  Lennette Bihari, MD, [facility]  07/28/2013 3:23 PM

## 2013-07-29 ENCOUNTER — Other Ambulatory Visit: Payer: Self-pay | Admitting: Cardiovascular Disease

## 2013-07-29 NOTE — Telephone Encounter (Signed)
Rx refill sent to patient pharmacy   

## 2013-09-10 ENCOUNTER — Other Ambulatory Visit: Payer: Self-pay | Admitting: *Deleted

## 2013-09-10 MED ORDER — METOPROLOL TARTRATE 100 MG PO TABS: ORAL_TABLET | ORAL | Status: DC

## 2014-03-18 ENCOUNTER — Other Ambulatory Visit: Payer: Self-pay | Admitting: Cardiovascular Disease

## 2014-03-30 ENCOUNTER — Telehealth: Payer: Self-pay | Admitting: Cardiovascular Disease

## 2014-03-30 NOTE — Telephone Encounter (Signed)
Routed to Dr. [name] for his information.

## 2014-03-30 NOTE — Telephone Encounter (Signed)
Isaac Goodwin is calling because he had some lab work done and wanted to let Dr. [name] know that from the results of the lab work he was put on Vitamin D and simvastatin because his cholesterol was 291. Please call if you have any questions. Please call   Thanks

## 2014-03-31 NOTE — Telephone Encounter (Signed)
OK 

## 2014-06-24 ENCOUNTER — Encounter: Payer: Self-pay | Admitting: Family Medicine

## 2014-06-24 ENCOUNTER — Ambulatory Visit (INDEPENDENT_AMBULATORY_CARE_PROVIDER_SITE_OTHER): Payer: No Typology Code available for payment source | Admitting: Family Medicine

## 2014-06-24 VITALS — BP 148/98 | HR 86 | Temp 98.0°F | Resp 16 | Ht 77.0 in | Wt 282.0 lb

## 2014-06-24 DIAGNOSIS — I10 Essential (primary) hypertension: Secondary | ICD-10-CM

## 2014-06-24 DIAGNOSIS — G4719 Other hypersomnia: Secondary | ICD-10-CM

## 2014-06-24 DIAGNOSIS — I214 Non-ST elevation (NSTEMI) myocardial infarction: Secondary | ICD-10-CM

## 2014-06-24 DIAGNOSIS — F17200 Nicotine dependence, unspecified, uncomplicated: Secondary | ICD-10-CM

## 2014-06-24 DIAGNOSIS — E8881 Metabolic syndrome: Secondary | ICD-10-CM

## 2014-06-24 DIAGNOSIS — E785 Hyperlipidemia, unspecified: Secondary | ICD-10-CM

## 2014-06-24 DIAGNOSIS — G4730 Sleep apnea, unspecified: Secondary | ICD-10-CM

## 2014-06-24 DIAGNOSIS — E559 Vitamin D deficiency, unspecified: Secondary | ICD-10-CM | POA: Insufficient documentation

## 2014-06-24 LAB — CBC WITH DIFFERENTIAL/PLATELET
BASOS ABS: 0.1 10*3/uL (ref 0.0–0.1)
Basophils Relative: 1 % (ref 0–1)
Eosinophils Absolute: 0.2 10*3/uL (ref 0.0–0.7)
Eosinophils Relative: 2 % (ref 0–5)
HCT: 48.3 % (ref 39.0–52.0)
Hemoglobin: 17.4 g/dL — ABNORMAL HIGH (ref 13.0–17.0)
Lymphocytes Relative: 30 % (ref 12–46)
Lymphs Abs: 2.5 10*3/uL (ref 0.7–4.0)
MCH: 32.1 pg (ref 26.0–34.0)
MCHC: 36 g/dL (ref 30.0–36.0)
MCV: 89.1 fL (ref 78.0–100.0)
MPV: 10.9 fL (ref 8.6–12.4)
Monocytes Absolute: 0.7 10*3/uL (ref 0.1–1.0)
Monocytes Relative: 8 % (ref 3–12)
NEUTROS PCT: 59 % (ref 43–77)
Neutro Abs: 5 10*3/uL (ref 1.7–7.7)
Platelets: 236 10*3/uL (ref 150–400)
RBC: 5.42 MIL/uL (ref 4.22–5.81)
RDW: 13.3 % (ref 11.5–15.5)
WBC: 8.4 10*3/uL (ref 4.0–10.5)

## 2014-06-24 LAB — COMPLETE METABOLIC PANEL WITH GFR
ALT: 29 U/L (ref 0–53)
AST: 21 U/L (ref 0–37)
Albumin: 4.7 g/dL (ref 3.5–5.2)
Alkaline Phosphatase: 63 U/L (ref 39–117)
BUN: 15 mg/dL (ref 6–23)
CALCIUM: 9.8 mg/dL (ref 8.4–10.5)
CO2: 22 meq/L (ref 19–32)
CREATININE: 0.95 mg/dL (ref 0.50–1.35)
Chloride: 103 mEq/L (ref 96–112)
Glucose, Bld: 88 mg/dL (ref 70–99)
Potassium: 4 mEq/L (ref 3.5–5.3)
Sodium: 136 mEq/L (ref 135–145)
Total Bilirubin: 0.6 mg/dL (ref 0.2–1.2)
Total Protein: 7.6 g/dL (ref 6.0–8.3)

## 2014-06-24 LAB — POCT URINALYSIS DIP (DEVICE)
BILIRUBIN URINE: NEGATIVE
Glucose, UA: NEGATIVE mg/dL
Hgb urine dipstick: NEGATIVE
KETONES UR: NEGATIVE mg/dL
LEUKOCYTES UA: NEGATIVE
Nitrite: NEGATIVE
Protein, ur: NEGATIVE mg/dL
Specific Gravity, Urine: 1.025 (ref 1.005–1.030)
Urobilinogen, UA: 0.2 mg/dL (ref 0.0–1.0)
pH: 5 (ref 5.0–8.0)

## 2014-06-24 LAB — LIPID PANEL
CHOL/HDL RATIO: 6.9 ratio
Cholesterol: 200 mg/dL (ref 0–200)
HDL: 29 mg/dL — ABNORMAL LOW (ref >=40)
LDL Cholesterol: 131 mg/dL — ABNORMAL HIGH (ref 0–99)
Triglycerides: 199 mg/dL — ABNORMAL HIGH (ref <150)
VLDL: 40 mg/dL (ref 0–40)

## 2014-06-24 LAB — HEMOGLOBIN A1C
HEMOGLOBIN A1C: 5.8 % — AB (ref <5.7)
Mean Plasma Glucose: 120 mg/dL — ABNORMAL HIGH (ref <117)

## 2014-06-24 LAB — TSH: TSH: 1.531 u[IU]/mL (ref 0.350–4.500)

## 2014-06-24 MED ORDER — SIMVASTATIN 20 MG PO TABS: 20.0000 mg | ORAL_TABLET | Freq: Every day | ORAL | Status: DC

## 2014-06-24 MED ORDER — ENALAPRIL MALEATE 10 MG PO TABS: 10.0000 mg | ORAL_TABLET | Freq: Every day | ORAL | Status: DC

## 2014-06-24 MED ORDER — VITAMIN D (ERGOCALCIFEROL) 1.25 MG (50000 UNIT) PO CAPS: 50000.0000 [IU] | ORAL_CAPSULE | ORAL | Status: DC

## 2014-06-24 NOTE — Progress Notes (Signed)
Subjective:    Patient ID: Isaac Goodwin, male    DOB: 08/23/1970, 44 y.o.   MRN: [id number]  HPI [name], a 44 year old male with a history of hypertension, dyslipidemia, sleep apnea, and myocardial infarction presents to establish care with a primary provider. [name] states that he has been primarily followed by Dr. Nicki Goodwin, cardiologist for hypertension and hyperlipidemia for 6 years.   Patient here for history of hypertension and hyperlipidemia. He is not exercising and is not adherent to low salt diet. [name] does not check blood pressure at home. He states that blood pressure has been controlled on current medication regimen. He reports that he has been out of one anti-hypertensive medication for several days.   Patient denies dizziness chest pain, dyspnea, lower extremity edema, orthopnea, syncope and tachypnea.  Cardiovascular risk factors include: dyslipidemia, hypertension, obesity (BMI >= 30 kg/m2), sedentary lifestyle and smoking/ tobacco exposure.    Patient also complaining of obstructive sleep apnea. Patent has a 6 year history of obstructive sleep apnea. He state that he had a sleep study 6 years ago at [facility]. He state that he was unable to wear a CPAP/BIPAP consistently. He states that he gets 4-5 hours of interrupted sleep per night. He state that he has excessive daytime sleepiness and fatigue. Patient's wife states that she has witnessed apneic episodes. Patient also states that he has a deviated septum.     Past Medical History  Diagnosis Date  . Hypertension   . Hypercholesteremia   . Sleep apnea   . Sinus headache   . Coronary artery disease   . Non-STEMI (non-ST elevated myocardial infarction) 10/19/07    successful cutting balloon atherotomy/stenting cypher stent 3.0x75mm just beyond ostium prox. in the RCA. 10/23/07 cypher stents placed in LAD & CX  . OSA (obstructive sleep apnea)    History   Social History  . Marital  Status: Divorced    Spouse Name: N/A  . Number of Children: N/A  . Years of Education: N/A   Occupational History  . Not on file.   Social History Main Topics  . Smoking status: Current Some Day Smoker -- 0.50 packs/day for 10 years    Types: Cigarettes  . Smokeless tobacco: Never Used  . Alcohol Use: 2.4 oz/week    4 Glasses of wine per week     Comment: rarely  . Drug Use: No  . Sexual Activity: Not on file   Other Topics Concern  . Not on file   Social History Narrative   Review of Systems  Constitutional: Positive for fatigue and unexpected weight change.  HENT: Negative.   Eyes: Negative.   Respiratory: Positive for apnea. Negative for shortness of breath and wheezing.   Cardiovascular: Negative for chest pain and palpitations.  Gastrointestinal: Negative.   Endocrine: Negative.  Negative for polydipsia, polyphagia and polyuria.  Genitourinary: Negative.  Negative for dysuria and difficulty urinating.  Musculoskeletal: Negative.   Skin: Negative.   Allergic/Immunologic: Negative.   Neurological: Negative.  Negative for dizziness and speech difficulty.  Hematological: Negative.   Psychiatric/Behavioral: Positive for sleep disturbance.      Objective:   Physical Exam  Constitutional: He is oriented to person, place, and time. He appears well-developed.  HENT:  Head: Normocephalic and atraumatic.  Right Ear: Hearing, tympanic membrane, external ear and ear canal normal.  Left Ear: Hearing, tympanic membrane, external ear and ear canal normal.  Nose: Nose normal.  Mouth/Throat:  Uvula is midline and oropharynx is clear and moist.  Eyes: Conjunctivae, EOM and lids are normal. Pupils are equal, round, and reactive to light.  Neck: Trachea normal and normal range of motion. Neck supple.  Cardiovascular: Normal rate, normal heart sounds and intact distal pulses.   Pulmonary/Chest: Effort normal and breath sounds normal.  Abdominal: Soft. Bowel sounds are normal.   Neurological: He is alert and oriented to person, place, and time. He has normal reflexes.  Skin: Skin is warm and dry.  Psychiatric: He has a normal mood and affect. His behavior is normal. Judgment and thought content normal.         BP 148/98 mmHg  Pulse 86  Temp(Src) 98 F (36.7 C) (Oral)  Resp 16  Ht  (1.956 m)  Wt 282 lb (127.914 kg)  BMI 33.43 kg/m2 Assessment & Plan:  1. Essential hypertension Current blood pressure not at goal on medication regimen. He states that he has been consistently taking metoprolol, but is currently out of Vasotec 10 mg. Will reorder and check for proteinuria, BUN, and creatinine.   - EKG 12-Lead - enalapril (VASOTEC) 10 MG tablet; Take 1 tablet (10 mg total) by mouth daily.  Dispense: 30 tablet; Refill: 5   2. Acute non-ST segment elevation myocardial infarction Isaac Goodwin is current a patient of Dr. Tresa Goodwin at [facility]. He states that he has been followed yearly due to insurance constraints. Dr. Tresa Goodwin has continued to refill prescriptions. Patient reports that he has been taking medications consistently. Isaac Goodwin had an echocardiogram, carotid dopplers, and a renal duplex in 2013. Reviewed previous notes.  - EKG 12-Lead  3.Sleep apnea Isaac Goodwin reports a history of obstructive sleep apnea. He states that he had a diagnostic polysonogram 6 years ago. Patient states that he was previously on CPAP, however he was unable to wear mask for long periods of time. Patient's wife reports that she has witnessed apneic episodes and snoring. Patient reports that he is unable to get greater than 4-5 hours of uninterrupted sleep and has excessive daytime sleepiness.  - Split night study; Future  4. Excessive daytime sleepiness - Split night study; Future  5. Dyslipidemia Isaac Goodwin reports a history of dyslipidemia, he has been consistently taking Zocor 20 mg. He states that it has been many months since last cholesterol check. Patient is  currently decreased fast food intake and is following a low fat diet. He states that he has not started an exercise regimen.  - simvastatin (ZOCOR) 20 MG tablet; Take 20 mg by mouth daily. - Lipid Panel  6. Vitamin D deficiency Isaac Goodwin has a history of a vitamin D deficiency. Will continue weekly Drisdol.  - Vitamin D, Ergocalciferol, (DRISDOL) 50000 UNITS CAPS capsule; Take 50,000 Units by mouth every 7 (seven) days. - Vitamin D, 25-hydroxy   7. Metabolic syndrome Isaac Goodwin currently has and increased abdominal girth, history of weight gain and dyslipidemia.  - CBC with Differential - COMPLETE METABOLIC PANEL WITH GFR - TSH - Hemoglobin A1c  8. Tobacco dependence Smoking cessation instruction/counseling given:  counseled patient on the dangers of tobacco use, advised patient to stop smoking, and reviewed strategies to maximize success    RTC: F/u 3 months for hypertension Caitlyne Ingham M, FNP

## 2014-06-24 NOTE — Patient Instructions (Signed)
Sleep Apnea  Sleep apnea is a sleep disorder characterized by abnormal pauses in breathing while you sleep. When your breathing pauses, the level of oxygen in your blood decreases. This causes you to move out of deep sleep and into light sleep. As a result, your quality of sleep is poor, and the system that carries your blood throughout your body (cardiovascular system) experiences stress. If sleep apnea remains untreated, the following conditions can develop:  High blood pressure (hypertension).  Coronary artery disease.  Inability to achieve or maintain an erection (impotence).  Impairment of your thought process (cognitive dysfunction). There are three types of sleep apnea:  Obstructive sleep apnea--Pauses in breathing during sleep because of a blocked airway.  Central sleep apnea--Pauses in breathing during sleep because the area of the brain that controls your breathing does not send the correct signals to the muscles that control breathing.  Mixed sleep apnea--A combination of both obstructive and central sleep apnea. RISK FACTORS The following risk factors can increase your risk of developing sleep apnea:  Being overweight.  Smoking.  Having narrow passages in your nose and throat.  Being of older age.  Being male.  Alcohol use.  Sedative and tranquilizer use.  Ethnicity. Among individuals younger than 35 years, African Americans are at increased risk of sleep apnea. SYMPTOMS   Difficulty staying asleep.  Daytime sleepiness and fatigue.  Loss of energy.  Irritability.  Loud, heavy snoring.  Morning headaches.  Trouble concentrating.  Forgetfulness.  Decreased interest in sex. DIAGNOSIS  In order to diagnose sleep apnea, your caregiver will perform a physical examination. Your caregiver may suggest that you take a home sleep test. Your caregiver may also recommend that you spend the night in a sleep lab. In the sleep lab, several monitors record  information about your heart, lungs, and brain while you sleep. Your leg and arm movements and blood oxygen level are also recorded. TREATMENT The following actions may help to resolve mild sleep apnea:  Sleeping on your side.   Using a decongestant if you have nasal congestion.   Avoiding the use of depressants, including alcohol, sedatives, and narcotics.   Losing weight and modifying your diet if you are overweight. There also are devices and treatments to help open your airway:  Oral appliances. These are custom-made mouthpieces that shift your lower jaw forward and slightly open your bite. This opens your airway.  Devices that create positive airway pressure. This positive pressure "splints" your airway open to help you breathe better during sleep. The following devices create positive airway pressure:  Continuous positive airway pressure (CPAP) device. The CPAP device creates a continuous level of air pressure with an air pump. The air is delivered to your airway through a mask while you sleep. This continuous pressure keeps your airway open.  Nasal expiratory positive airway pressure (EPAP) device. The EPAP device creates positive air pressure as you exhale. The device consists of single-use valves, which are inserted into each nostril and held in place by adhesive. The valves create very little resistance when you inhale but create much more resistance when you exhale. That increased resistance creates the positive airway pressure. This positive pressure while you exhale keeps your airway open, making it easier to breath when you inhale again.  Bilevel positive airway pressure (BPAP) device. The BPAP device is used mainly in patients with central sleep apnea. This device is similar to the CPAP device because it also uses an air pump to deliver continuous air pressure  through a mask. However, with the BPAP machine, the pressure is set at two different levels. The pressure when you  exhale is lower than the pressure when you inhale.  Surgery. Typically, surgery is only done if you cannot comply with less invasive treatments or if the less invasive treatments do not improve your condition. Surgery involves removing excess tissue in your airway to create a wider passage way. Document Released: 01/25/2002 Document Revised: 06/01/2012 Document Reviewed: 06/13/2011 [facility] Patient Information 2015 [place], [place]. This information is not intended to replace advice given to you by your health care provider. Make sure you discuss any questions you have with your health care provider. Food Choices to Lower Your Triglycerides  Triglycerides are a type of fat in your blood. High levels of triglycerides can increase the risk of heart disease and stroke. If your triglyceride levels are high, the foods you eat and your eating habits are very important. Choosing the right foods can help lower your triglycerides.  WHAT GENERAL GUIDELINES DO I NEED TO FOLLOW?  Lose weight if you are overweight.   Limit or avoid alcohol.   Fill one half of your plate with vegetables and green salads.   Limit fruit to two servings a day. Choose fruit instead of juice.   Make one fourth of your plate whole grains. Look for the word "whole" as the first word in the ingredient list.  Fill one fourth of your plate with lean protein foods.  Enjoy fatty fish (such as salmon, mackerel, sardines, and tuna) three times a week.   Choose healthy fats.   Limit foods high in starch and sugar.  Eat more home-cooked food and less restaurant, buffet, and fast food.  Limit fried foods.  Cook foods using methods other than frying.  Limit saturated fats.  Check ingredient lists to avoid foods with partially hydrogenated oils (trans fats) in them. WHAT FOODS CAN I EAT?  Grains Whole grains, such as whole wheat or whole grain breads, crackers, cereals, and pasta. Unsweetened oatmeal, bulgur, barley,  quinoa, or brown rice. Corn or whole wheat flour tortillas.  Vegetables Fresh or frozen vegetables (raw, steamed, roasted, or grilled). Green salads. Fruits All fresh, canned (in natural juice), or frozen fruits. Meat and Other Protein Products Ground beef (85% or leaner), grass-fed beef, or beef trimmed of fat. Skinless chicken or [place]. Ground chicken or [place]. Pork trimmed of fat. All fish and seafood. Eggs. Dried beans, peas, or lentils. Unsalted nuts or seeds. Unsalted canned or dry beans. Dairy Low-fat dairy products, such as skim or 1% milk, 2% or reduced-fat cheeses, low-fat ricotta or cottage cheese, or plain low-fat yogurt. Fats and Oils Tub margarines without trans fats. Light or reduced-fat mayonnaise and salad dressings. Avocado. Safflower, olive, or canola oils. Natural peanut or almond butter. The items listed above may not be a complete list of recommended foods or beverages. Contact your dietitian for more options. WHAT FOODS ARE NOT RECOMMENDED?  Grains White bread. White pasta. White rice. Cornbread. Bagels, pastries, and croissants. Crackers that contain trans fat. Vegetables White potatoes. Corn. Creamed or fried vegetables. Vegetables in a cheese sauce. Fruits Dried fruits. Canned fruit in light or heavy syrup. Fruit juice. Meat and Other Protein Products Fatty cuts of meat. Ribs, chicken wings, bacon, sausage, bologna, salami, chitterlings, fatback, hot dogs, bratwurst, and packaged luncheon meats. Dairy Whole or 2% milk, cream, half-and-half, and cream cheese. Whole-fat or sweetened yogurt. Full-fat cheeses. Nondairy creamers and whipped toppings. Processed cheese, cheese spreads, or  cheese curds. Sweets and Desserts Corn syrup, sugars, honey, and molasses. Candy. Jam and jelly. Syrup. Sweetened cereals. Cookies, pies, cakes, donuts, muffins, and ice cream. Fats and Oils Butter, stick margarine, lard, shortening, ghee, or bacon fat. Coconut, palm kernel, or palm  oils. Beverages Alcohol. Sweetened drinks (such as sodas, lemonade, and fruit drinks or punches). The items listed above may not be a complete list of foods and beverages to avoid. Contact your dietitian for more information. Document Released: 11/23/2003 Document Revised: 02/09/2013 Document Reviewed: 12/09/2012 [employer] Patient Information 2015 [place], [place]. This information is not intended to replace advice given to you by your health care provider. Make sure you discuss any questions you have with your health care provider. Hypertension Hypertension, commonly called high blood pressure, is when the force of blood pumping through your arteries is too strong. Your arteries are the blood vessels that carry blood from your heart throughout your body. A blood pressure reading consists of a higher number over a lower number, such as 110/72. The higher number (systolic) is the pressure inside your arteries when your heart pumps. The lower number (diastolic) is the pressure inside your arteries when your heart relaxes. Ideally you want your blood pressure below 120/80. Hypertension forces your heart to work harder to pump blood. Your arteries may become narrow or stiff. Having hypertension puts you at risk for heart disease, stroke, and other problems.  RISK FACTORS Some risk factors for high blood pressure are controllable. Others are not.  Risk factors you cannot control include:   Race. You may be at higher risk if you are African American.  Age. Risk increases with age.  Gender. Men are at higher risk than women before age 17 years. After age 85, women are at higher risk than men. Risk factors you can control include:  Not getting enough exercise or physical activity.  Being overweight.  Getting too much fat, sugar, calories, or salt in your diet.  Drinking too much alcohol. SIGNS AND SYMPTOMS Hypertension does not usually cause signs or symptoms. Extremely high blood pressure  (hypertensive crisis) may cause headache, anxiety, shortness of breath, and nosebleed. DIAGNOSIS  To check if you have hypertension, your health care provider will measure your blood pressure while you are seated, with your arm held at the level of your heart. It should be measured at least twice using the same arm. Certain conditions can cause a difference in blood pressure between your right and left arms. A blood pressure reading that is higher than normal on one occasion does not mean that you need treatment. If one blood pressure reading is high, ask your health care provider about having it checked again. TREATMENT  Treating high blood pressure includes making lifestyle changes and possibly taking medicine. Living a healthy lifestyle can help lower high blood pressure. You may need to change some of your habits. Lifestyle changes may include:  Following the DASH diet. This diet is high in fruits, vegetables, and whole grains. It is low in salt, red meat, and added sugars.  Getting at least 2 hours of brisk physical activity every week.  Losing weight if necessary.  Not smoking.  Limiting alcoholic beverages.  Learning ways to reduce stress. If lifestyle changes are not enough to get your blood pressure under control, your health care provider may prescribe medicine. You may need to take more than one. Work closely with your health care provider to understand the risks and benefits. HOME CARE INSTRUCTIONS  Have your blood  pressure rechecked as directed by your health care provider.   Take medicines only as directed by your health care provider. Follow the directions carefully. Blood pressure medicines must be taken as prescribed. The medicine does not work as well when you skip doses. Skipping doses also puts you at risk for problems.   Do not smoke.   Monitor your blood pressure at home as directed by your health care provider. SEEK MEDICAL CARE IF:   You think you are  having a reaction to medicines taken.  You have recurrent headaches or feel dizzy.  You have swelling in your ankles.  You have trouble with your vision. SEEK IMMEDIATE MEDICAL CARE IF:  You develop a severe headache or confusion.  You have unusual weakness, numbness, or feel faint.  You have severe chest or abdominal pain.  You vomit repeatedly.  You have trouble breathing. MAKE SURE YOU:   Understand these instructions.  Will watch your condition.  Will get help right away if you are not doing well or get worse. Document Released: 02/04/2005 Document Revised: 06/21/2013 Document Reviewed: 11/27/2012 [facility] Patient Information 2015 [place], [place]. This information is not intended to replace advice given to you by your health care provider. Make sure you discuss any questions you have with your health care provider.

## 2014-06-25 LAB — VITAMIN D 25 HYDROXY (VIT D DEFICIENCY, FRACTURES): VIT D 25 HYDROXY: 27 ng/mL — AB (ref 30–100)

## 2014-06-28 DIAGNOSIS — G4719 Other hypersomnia: Secondary | ICD-10-CM | POA: Insufficient documentation

## 2014-06-28 DIAGNOSIS — F17200 Nicotine dependence, unspecified, uncomplicated: Secondary | ICD-10-CM | POA: Insufficient documentation

## 2014-06-28 DIAGNOSIS — I10 Essential (primary) hypertension: Secondary | ICD-10-CM | POA: Insufficient documentation

## 2014-07-11 ENCOUNTER — Ambulatory Visit: Payer: No Typology Code available for payment source | Admitting: Family Medicine

## 2014-07-28 ENCOUNTER — Telehealth: Payer: Self-pay | Admitting: Internal Medicine

## 2014-07-28 NOTE — Telephone Encounter (Signed)
Patient called stating he was unsure of the necessity of his appointment on 07/29/14.

## 2014-07-29 ENCOUNTER — Ambulatory Visit (INDEPENDENT_AMBULATORY_CARE_PROVIDER_SITE_OTHER): Payer: No Typology Code available for payment source | Admitting: Family Medicine

## 2014-07-29 ENCOUNTER — Ambulatory Visit: Payer: No Typology Code available for payment source | Admitting: Family Medicine

## 2014-07-29 VITALS — BP 134/98 | HR 67 | Temp 98.1°F | Resp 16 | Ht 77.0 in | Wt 279.0 lb

## 2014-07-29 DIAGNOSIS — F17200 Nicotine dependence, unspecified, uncomplicated: Secondary | ICD-10-CM

## 2014-07-29 DIAGNOSIS — F419 Anxiety disorder, unspecified: Secondary | ICD-10-CM

## 2014-07-29 DIAGNOSIS — G4730 Sleep apnea, unspecified: Secondary | ICD-10-CM

## 2014-07-29 DIAGNOSIS — I10 Essential (primary) hypertension: Secondary | ICD-10-CM

## 2014-07-29 DIAGNOSIS — G4700 Insomnia, unspecified: Secondary | ICD-10-CM

## 2014-07-29 MED ORDER — ALPRAZOLAM 0.25 MG PO TABS: 0.2500 mg | ORAL_TABLET | Freq: Every evening | ORAL | Status: DC | PRN

## 2014-07-29 MED ORDER — METOPROLOL TARTRATE 100 MG PO TABS: ORAL_TABLET | ORAL | Status: DC

## 2014-07-29 NOTE — Patient Instructions (Signed)
Insomnia Insomnia is frequent trouble falling and/or staying asleep. Insomnia can be a long term problem or a short term problem. Both are common. Insomnia can be a short term problem when the wakefulness is related to a certain stress or worry. Long term insomnia is often related to ongoing stress during waking hours and/or poor sleeping habits. Overtime, sleep deprivation itself can make the problem worse. Every little thing feels more severe because you are overtired and your ability to cope is decreased. CAUSES   Stress, anxiety, and depression.  Poor sleeping habits.  Distractions such as TV in the bedroom.  Naps close to bedtime.  Engaging in emotionally charged conversations before bed.  Technical reading before sleep.  Alcohol and other sedatives. They may make the problem worse. They can hurt normal sleep patterns and normal dream activity.  Stimulants such as caffeine for several hours prior to bedtime.  Pain syndromes and shortness of breath can cause insomnia.  Exercise late at night.  Changing time zones may cause sleeping problems (jet lag). It is sometimes helpful to have someone observe your sleeping patterns. They should look for periods of not breathing during the night (sleep apnea). They should also look to see how long those periods last. If you live alone or observers are uncertain, you can also be observed at a sleep clinic where your sleep patterns will be professionally monitored. Sleep apnea requires a checkup and treatment. Give your caregivers your medical history. Give your caregivers observations your family has made about your sleep.  SYMPTOMS   Not feeling rested in the morning.  Anxiety and restlessness at bedtime.  Difficulty falling and staying asleep. TREATMENT   Your caregiver may prescribe treatment for an underlying medical disorders. Your caregiver can give advice or help if you are using alcohol or other drugs for self-medication. Treatment  of underlying problems will usually eliminate insomnia problems.  Medications can be prescribed for short time use. They are generally not recommended for lengthy use.  Over-the-counter sleep medicines are not recommended for lengthy use. They can be habit forming.  You can promote easier sleeping by making lifestyle changes such as:  Using relaxation techniques that help with breathing and reduce muscle tension.  Exercising earlier in the day.  Changing your diet and the time of your last meal. No night time snacks.  Establish a regular time to go to bed.  Counseling can help with stressful problems and worry.  Soothing music and white noise may be helpful if there are background noises you cannot remove.  Stop tedious detailed work at least one hour before bedtime. HOME CARE INSTRUCTIONS   Keep a diary. Inform your caregiver about your progress. This includes any medication side effects. See your caregiver regularly. Take note of:  Times when you are asleep.  Times when you are awake during the night.  The quality of your sleep.  How you feel the next day. This information will help your caregiver care for you.  Get out of bed if you are still awake after 15 minutes. Read or do some quiet activity. Keep the lights down. Wait until you feel sleepy and go back to bed.  Keep regular sleeping and waking hours. Avoid naps.  Exercise regularly.  Avoid distractions at bedtime. Distractions include watching television or engaging in any intense or detailed activity like attempting to balance the household checkbook.  Develop a bedtime ritual. Keep a familiar routine of bathing, brushing your teeth, climbing into bed at the same   time each night, listening to soothing music. Routines increase the success of falling to sleep faster.  Use relaxation techniques. This can be using breathing and muscle tension release routines. It can also include visualizing peaceful scenes. You can  also help control troubling or intruding thoughts by keeping your mind occupied with boring or repetitive thoughts like the old concept of counting sheep. You can make it more creative like imagining planting one beautiful flower after another in your backyard garden.  During your day, work to eliminate stress. When this is not possible use some of the previous suggestions to help reduce the anxiety that accompanies stressful situations. MAKE SURE YOU:   Understand these instructions.  Will watch your condition.  Will get help right away if you are not doing well or get worse. Document Released: 02/02/2000 Document Revised: 04/29/2011 Document Reviewed: 03/04/2007 [employer] Patient Information 2015 [employer]. This information is not intended to replace advice given to you by your health care provider. Make sure you discuss any questions you have with your health care provider.  

## 2014-07-29 NOTE — Progress Notes (Signed)
Subjective:    Isaac Goodwin ID: Isaac Goodwin, male    DOB: Sep 18, 1970, 43 y.o.   MRN: [id number]  HPI Isaac Goodwin, a 44 year old male with a history of hypertension, dyslipidemia, sleep apnea, and myocardial infarction presents with a complaint of insomnia.  Isaac Goodwin has a history of obstructive sleep apnea and is not currently utilizing a CPAP. Isaac Goodwin has an appointment scheduled for a sleep study in 1 month. Today he is complaining of inability to fall asleep and remain asleep, irritability and agitation due to lack of sleep.    Isaac Goodwin also complaining of obstructive sleep apnea. Patent has a 6 year history of obstructive sleep apnea. He states that he had a sleep study 6 years ago at [facility]. He state that he was unable to wear a CPAP/BIPAP consistently. He states that he gets 2-3 hours of interrupted sleep per night. He state that he has excessive daytime sleepiness and fatigue. Isaac Goodwin's wife states that she has witnessed apneic episodes. Isaac Goodwin also states that he has a deviated septum.    Past Medical History  Diagnosis Date  . Hypertension   . Hypercholesteremia   . Sleep apnea   . Sinus headache   . Coronary artery disease   . Non-STEMI (non-ST elevated myocardial infarction) 10/19/07    successful cutting balloon atherotomy/stenting cypher stent 3.0x61mm just beyond ostium prox. in the RCA. 10/23/07 cypher stents placed in LAD & CX  . OSA (obstructive sleep apnea)    History   Social History  . Marital Status: Divorced    Spouse Name: N/A  . Number of Children: N/A  . Years of Education: N/A   Occupational History  . Not on file.   Social History Main Topics  . Smoking status: Current Some Day Smoker -- 0.50 packs/day for 10 years    Types: Cigarettes  . Smokeless tobacco: Never Used  . Alcohol Use: 2.4 oz/week    4 Glasses of wine per week     Comment: rarely  . Drug Use: No  . Sexual Activity: Not on file   Other Topics Concern  .  Not on file   Social History Narrative   Review of Systems  Constitutional: Positive for fatigue and unexpected weight change.  HENT: Negative.   Eyes: Negative.  Negative for pain.  Respiratory: Positive for apnea. Negative for shortness of breath and wheezing.   Cardiovascular: Negative for chest pain and palpitations.  Gastrointestinal: Negative.   Endocrine: Negative.  Negative for polydipsia, polyphagia and polyuria.  Genitourinary: Negative.  Negative for dysuria and difficulty urinating.  Musculoskeletal: Negative.   Skin: Negative.   Allergic/Immunologic: Negative.   Neurological: Negative.  Negative for dizziness.  Hematological: Negative.   Psychiatric/Behavioral: Positive for sleep disturbance. The Isaac Goodwin is nervous/anxious.       Objective:   Physical Exam  Constitutional: He is oriented to person, place, and time. He appears well-developed.  HENT:  Head: Normocephalic and atraumatic.  Right Ear: Hearing, tympanic membrane, external ear and ear canal normal.  Left Ear: Hearing, tympanic membrane, external ear and ear canal normal.  Nose: Nose normal.  Mouth/Throat: Uvula is midline and oropharynx is clear and moist.  Eyes: Conjunctivae, EOM and lids are normal. Pupils are equal, round, and reactive to light.  Neck: Trachea normal and normal range of motion. Neck supple.  Cardiovascular: Normal rate, normal heart sounds and intact distal pulses.   Pulmonary/Chest: Effort normal and breath sounds normal.  Abdominal: Soft.  Bowel sounds are normal.  Neurological: He is alert and oriented to person, place, and time. He has normal reflexes.  Skin: Skin is warm and dry.  Psychiatric: He has a normal mood and affect. His behavior is normal. Judgment and thought content normal.      BP 134/98 mmHg  Pulse 67  Temp(Src) 98.1 F (36.7 C) (Oral)  Resp 16  Ht  (1.956 m)  Wt 279 lb (126.554 kg)  BMI 33.08 kg/m2 Assessment & Plan:  1. Insomnia Isaac Goodwin has a history  of obstructive sleep apnea. He has not utilized a CPAP in several years due to insurance constraints. Isaac Goodwin has an appointment for sleep apnea scheduled for next month. Will start a trial of Xanax at bedtime.  - ALPRAZolam (XANAX) 0.25 MG tablet; Take 1 tablet (0.25 mg total) by mouth at bedtime as needed for anxiety.  Dispense: 20 tablet; Refill: 0  2. Anxiety Isaac Goodwin reports that he is experiencing anxiety. Isaac Goodwin has been unable to sleep despite improving sleep hygiene as dicussed one month ago. Will start a trial of Xanax 0.025 at bedtime.   3. Sleep apnea Isaac Goodwin reports a history of obstructive sleep apnea. He states that he had a diagnostic polysonogram 6 years ago. Isaac Goodwin states that he was previously on CPAP, however he was unable to wear mask for long periods of time. Isaac Goodwin's wife reports that she has witnessed apneic episodes and snoring. Isaac Goodwin reports that he is unable to get greater than 4-5 hours of uninterrupted sleep and has excessive daytime sleepiness. He has a sleep study scheduled in 1 month.   4. Essential hypertension Isaac Goodwin is at goal on current medication regimen. He currently follows up with Dr. [name], cardiologist yearly. Isaac Goodwin is scheduled for cardiologist in 1 month, I will write a temporary prescription for Metoprolol until Isaac Goodwin can see cardiologist. Reviewed previous urinalysis, no proteinuria present.   - metoprolol (LOPRESSOR) 100 MG tablet; TAKE ONE TABLET BY MOUTH TWICE DAILY  Dispense: 60 tablet; Refill: 1  5. Tobacco dependence Smoking cessation instruction/counseling given:  counseled Isaac Goodwin on the dangers of tobacco use, advised Isaac Goodwin to stop smoking, and reviewed strategies to maximize success. Recently sent referral for [facility], smoking cessation program, Isaac Goodwin was not satisfied with process.    RTC: F/u 6 months for dyslipidemia, OSA and tobacco abuse in 6 months Tanveer Brammer M, FNP

## 2014-08-02 ENCOUNTER — Encounter: Payer: Self-pay | Admitting: Family Medicine

## 2014-08-02 DIAGNOSIS — G4700 Insomnia, unspecified: Secondary | ICD-10-CM | POA: Insufficient documentation

## 2014-09-04 ENCOUNTER — Ambulatory Visit (HOSPITAL_BASED_OUTPATIENT_CLINIC_OR_DEPARTMENT_OTHER): Payer: No Typology Code available for payment source | Attending: Family Medicine | Admitting: Radiology

## 2014-09-04 ENCOUNTER — Encounter (HOSPITAL_BASED_OUTPATIENT_CLINIC_OR_DEPARTMENT_OTHER): Payer: No Typology Code available for payment source

## 2014-09-04 VITALS — Ht 77.0 in | Wt 270.0 lb

## 2014-09-04 DIAGNOSIS — R0683 Snoring: Secondary | ICD-10-CM | POA: Insufficient documentation

## 2014-09-04 DIAGNOSIS — G4733 Obstructive sleep apnea (adult) (pediatric): Secondary | ICD-10-CM | POA: Insufficient documentation

## 2014-09-04 DIAGNOSIS — G4719 Other hypersomnia: Secondary | ICD-10-CM

## 2014-09-04 DIAGNOSIS — G4730 Sleep apnea, unspecified: Secondary | ICD-10-CM

## 2014-09-10 DIAGNOSIS — G4733 Obstructive sleep apnea (adult) (pediatric): Secondary | ICD-10-CM

## 2014-09-10 DIAGNOSIS — G4719 Other hypersomnia: Secondary | ICD-10-CM

## 2014-09-10 DIAGNOSIS — G4730 Sleep apnea, unspecified: Secondary | ICD-10-CM

## 2014-09-10 NOTE — Progress Notes (Addendum)
Patient Name: Isaac Goodwin, Isaac Goodwin Date: 09/04/2014 Gender: Male D.O.B: [birth year] Age (years): 1 Referring Provider: Cammie Sickle, FNP Height (inches): 75 Interpreting Physician: Baird Lyons MD, ABSM Weight (lbs): 270 RPSGT: Carolin Coy BMI: 34 MRN: [id number] Neck Size: 18.00 CLINICAL INFORMATION Sleep Study Type: Split Night PSG with CPAP  Indication for sleep study: Excessive Daytime Sleepiness, Hypertension, OSA, Snoring, Witnessed Apneas  Epworth Sleepiness Score: 3  SLEEP STUDY TECHNIQUE As per the AASM Manual for the Scoring of Sleep and Associated Events v2.3 (April 2016) with a hypopnea requiring 4% desaturations.  The channels recorded and monitored were frontal, central and occipital EEG, electrooculogram (EOG), submentalis EMG (chin), nasal and oral airflow, thoracic and abdominal wall motion, anterior tibialis EMG, snore microphone, electrocardiogram, and pulse oximetry. Continuous positive airway pressure (CPAP) was initiated when the patient met split night criteria and was titrated according to treat sleep-disordered breathing.  MEDICATIONS Medications taken by the patient : Charted for review Medications administered by patient during sleep study : No sleep medicine administered.  RESPIRATORY PARAMETERS Diagnostic  Total AHI (/hr): 15.7 RDI (/hr): 50.6 OA Index (/hr): 14.6 CA Index (/hr): 0.5 REM AHI (/hr): 18.5 NREM AHI (/hr): 15.5 Supine AHI (/hr): 8.6 Non-supine AHI (/hr): 17.23 Min O2 Sat (%): 89.00 Mean O2 (%): 94.93 Time below 88% (min): 0.0    Titration  Optimal Pressure (cm): 10 AHI at Optimal Pressure (/hr): 0.0 Min O2 at Optimal Pressure (%): 93.0 Supine % at Optimal (%): 0 Sleep % at Optimal (%): 96   SLEEP ARCHITECTURE The recording time for the entire night was 418.3 minutes.  During a baseline period of 187.3 minutes, the patient slept for 115.0 minutes in REM and nonREM, yielding a sleep efficiency of 61.4%. Sleep onset after  lights out was 41.5 minutes with a REM latency of 118.0 minutes. The patient spent 29.57% of the night in stage N1 sleep, 64.78% in stage N2 sleep, 0.00% in stage N3 and 5.65% in REM.  During the titration period of 226.8 minutes, the patient slept for 164.5 minutes in REM and nonREM, yielding a sleep efficiency of 72.5%. Sleep onset after CPAP initiation was 14.4 minutes with a REM latency of 117.5 minutes. The patient spent 18.24% of the night in stage N1 sleep, 63.22% in stage N2 sleep, 0.00% in stage N3 and 18.54% in REM.  Awake after sleep onset 97.4 minutes  CARDIAC DATA The 2 lead EKG demonstrated sinus rhythm. The mean heart rate was 58.21 beats per minute. Other EKG findings include: None.  LEG MOVEMENT DATA The total Periodic Limb Movements of Sleep (PLMS) were 8. The PLMS index was 1.72 .  IMPRESSIONS Moderate obstructive sleep apnea occurred during the diagnostic portion of the study(AHI = 15.7/hour). An optimal PAP pressure was selected for this patient ( 10 cm of water) No significant central sleep apnea occurred during the diagnostic portion of the study (CAI = 0.5/hour). Moderate oxygen desaturation was noted during the diagnostic portion of the study (Min O2 =89.00%). The patient snored with Moderate snoring volume during the diagnostic portion of the study. No cardiac abnormalities were noted during this study. Clinically significant periodic limb movements did not occur during sleep.  DIAGNOSIS Obstructive Sleep Apnea (327.23 [G47.33 ICD-10])  RECOMMENDATIONS Trial of CPAP therapy on 10 cm H2O with a Large size Resmed Full Face Mask AirFit F10 mask and heated humidification. Avoid alcohol, sedatives and other CNS depressants that may worsen sleep apnea and disrupt normal sleep architecture. Sleep hygiene should be reviewed to  assess factors that may improve sleep quality. Weight management and regular exercise should be initiated or continued.  Deneise Lever Diplomate, American Board of Sleep Medicine  ELECTRONICALLY SIGNED ON:  09/10/2014, 11:56 AM [place] PH: [phone]   FX: [phone] [place]

## 2014-11-01 ENCOUNTER — Ambulatory Visit (INDEPENDENT_AMBULATORY_CARE_PROVIDER_SITE_OTHER): Payer: No Typology Code available for payment source

## 2014-11-01 DIAGNOSIS — Z23 Encounter for immunization: Secondary | ICD-10-CM

## 2014-11-24 ENCOUNTER — Other Ambulatory Visit: Payer: Self-pay | Admitting: Family Medicine

## 2014-11-28 ENCOUNTER — Telehealth: Payer: Self-pay | Admitting: Family Medicine

## 2015-02-02 ENCOUNTER — Ambulatory Visit: Payer: No Typology Code available for payment source | Admitting: Family Medicine

## 2015-02-14 ENCOUNTER — Telehealth: Payer: Self-pay | Admitting: Cardiovascular Disease

## 2015-02-14 DIAGNOSIS — I10 Essential (primary) hypertension: Secondary | ICD-10-CM

## 2015-02-14 MED ORDER — ENALAPRIL MALEATE 10 MG PO TABS: 10.0000 mg | ORAL_TABLET | Freq: Every day | ORAL | Status: DC

## 2015-02-14 NOTE — Telephone Encounter (Signed)
E-SENT TO PHARMACY 90 DAY SUPPLY REFILL X 0  NEED KEEP APPOINTMENT

## 2015-02-14 NOTE — Telephone Encounter (Signed)
°*  STAT* If patient is at the pharmacy, call can be transferred to refill team.   1. Which medications need to be refilled? (please list name of each medication and dose if known) Enalapril   2. Which pharmacy/location (including street and city if local pharmacy) is medication to be sent to? [facility]   3. Do they need a 30 day or 90 day supply? 90

## 2015-03-27 ENCOUNTER — Ambulatory Visit: Payer: No Typology Code available for payment source | Admitting: Cardiovascular Disease

## 2015-04-25 ENCOUNTER — Ambulatory Visit: Payer: No Typology Code available for payment source | Admitting: Cardiovascular Disease

## 2015-05-18 ENCOUNTER — Other Ambulatory Visit: Payer: Self-pay | Admitting: Family Medicine

## 2015-06-08 ENCOUNTER — Other Ambulatory Visit: Payer: Self-pay | Admitting: *Deleted

## 2015-06-08 DIAGNOSIS — I10 Essential (primary) hypertension: Secondary | ICD-10-CM

## 2015-06-08 MED ORDER — ENALAPRIL MALEATE 10 MG PO TABS: 10.0000 mg | ORAL_TABLET | Freq: Every day | ORAL | Status: DC

## 2015-09-15 ENCOUNTER — Other Ambulatory Visit: Payer: Self-pay | Admitting: Family Medicine

## 2015-09-16 ENCOUNTER — Other Ambulatory Visit: Payer: Self-pay | Admitting: Cardiovascular Disease

## 2015-09-16 DIAGNOSIS — I10 Essential (primary) hypertension: Secondary | ICD-10-CM

## 2015-09-18 ENCOUNTER — Telehealth: Payer: Self-pay | Admitting: Cardiovascular Disease

## 2015-09-18 MED ORDER — METOPROLOL TARTRATE 100 MG PO TABS: 100.0000 mg | ORAL_TABLET | Freq: Two times a day (BID) | ORAL | 0 refills | Status: DC

## 2015-09-18 NOTE — Telephone Encounter (Signed)
Follow Up:; ° ° °Returning your call. °

## 2015-09-18 NOTE — Telephone Encounter (Signed)
Unable to reach pt or leave a message  Refill sent to the pharmacy electronically.  

## 2015-09-18 NOTE — Telephone Encounter (Signed)
Follow-up      *STAT* If patient is at the pharmacy, call can be transferred to refill team.   1. Which medications need to be refilled? (please list name of each medication and dose if known) Metoprolol 100 mg po twice daily  2. Which pharmacy/location (including street and city if local pharmacy) is medication to be sent to?[facility] [address]  3. Do they need a 30 day or 90 day supply? 30 day supply

## 2015-09-18 NOTE — Telephone Encounter (Signed)
REFILL 

## 2015-10-05 ENCOUNTER — Telehealth: Payer: Self-pay | Admitting: Cardiovascular Disease

## 2015-10-05 MED ORDER — METOPROLOL TARTRATE 100 MG PO TABS: 100.0000 mg | ORAL_TABLET | Freq: Two times a day (BID) | ORAL | 0 refills | Status: DC

## 2015-10-05 NOTE — Telephone Encounter (Signed)
Rx(s) sent to pharmacy electronically.  

## 2015-10-05 NOTE — Telephone Encounter (Signed)
New Message   *STAT* If patient is at the pharmacy, call can be transferred to refill team.   1. Which medications need to be refilled? (please list name of each medication and dose if known) Metoprolol  2. Which pharmacy/location (including street and city if local pharmacy) is medication to be sent to? [facility]  3. Do they need a 30 day or 90 day supply? 90 day

## 2015-12-15 ENCOUNTER — Encounter: Payer: Self-pay | Admitting: Cardiovascular Disease

## 2015-12-15 ENCOUNTER — Ambulatory Visit (INDEPENDENT_AMBULATORY_CARE_PROVIDER_SITE_OTHER): Payer: Self-pay | Admitting: Cardiovascular Disease

## 2015-12-15 VITALS — BP 117/72 | HR 50 | Ht 77.0 in | Wt 254.8 lb

## 2015-12-15 DIAGNOSIS — Z87891 Personal history of nicotine dependence: Secondary | ICD-10-CM

## 2015-12-15 DIAGNOSIS — I10 Essential (primary) hypertension: Secondary | ICD-10-CM

## 2015-12-15 DIAGNOSIS — I2583 Coronary atherosclerosis due to lipid rich plaque: Secondary | ICD-10-CM

## 2015-12-15 DIAGNOSIS — E8881 Metabolic syndrome: Secondary | ICD-10-CM

## 2015-12-15 DIAGNOSIS — Z79899 Other long term (current) drug therapy: Secondary | ICD-10-CM

## 2015-12-15 DIAGNOSIS — E785 Hyperlipidemia, unspecified: Secondary | ICD-10-CM

## 2015-12-15 DIAGNOSIS — I2510 Atherosclerotic heart disease of native coronary artery without angina pectoris: Secondary | ICD-10-CM

## 2015-12-15 MED ORDER — METOPROLOL SUCCINATE ER 50 MG PO TB24: ORAL_TABLET | ORAL | 6 refills | Status: DC

## 2015-12-15 MED ORDER — ENALAPRIL MALEATE 10 MG PO TABS: 10.0000 mg | ORAL_TABLET | Freq: Every day | ORAL | 6 refills | Status: DC

## 2015-12-15 NOTE — Patient Instructions (Addendum)
Your physician recommends that you return for lab work in: FASTING.   Your physician has recommended you make the following change in your medication:   1.) The lopressor has been changed to metoprolol succ. Take as directed on the bottle.  Your physician wants you to follow-up in: 6 months or sooner if needed. You will receive a reminder letter in the mail two months in advance. If you don't receive a letter, please call our office to schedule the follow-up appointment.  If you need a refill on your cardiac medications before your next appointment, please call your pharmacy.

## 2015-12-17 DIAGNOSIS — E785 Hyperlipidemia, unspecified: Secondary | ICD-10-CM | POA: Insufficient documentation

## 2015-12-17 DIAGNOSIS — Z87891 Personal history of nicotine dependence: Secondary | ICD-10-CM | POA: Insufficient documentation

## 2015-12-17 NOTE — Progress Notes (Signed)
Patient ID: Alix Stowers, male   DOB: 1970/11/21, 45 y.o.   MRN: [id number]     HPI: Wayman Hoard is a 45 y.o. male who presents to the office today for a 28 month follow up cardiology evaluation.  Mr. Neis has known CAD and  in August 2009 7 developed an acute coronary syndrome and ruled in for non-ST segment elevation MI.  He was found to have three-vessel CAD, and it was felt that his RCA was the culprit vessel.  He underwent successful PCI to his RCA with cutting balloon and stenting and insertion of a 3.0x13 mm Cypher stent.  He subsequently underwent staged 2 vessel intervention involving the LAD and circumflex vessel with Cypher stents.  Additional problems include hypertension, hyperlipidemia, tobacco use, and degenerative joint disease involving C5 and C6.  His nuclear perfusion study in October 2013  showed normal perfusion without scar or ischemia, ejection fraction 56%.  Renal duplex imaging showed normal renal arteries.  As I last saw him, he admits to significant weight loss.  He has been off statin therapy for the last 2 years.  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  He works as a Animal nutritionist.  He tells me he has been taking metoprolol, tartrate 100 mg daily instead of twice a day and has continued to take enalapril 10 mg.  He is only been taking his aspirin 81 mg 3 times per week.  He has not had recent blood work.  He denies PND, orthopnea.  He presents for evaluation.  Past Medical History:  Diagnosis Date  . Coronary artery disease   . Hypercholesteremia   . Hypertension   . Non-STEMI (non-ST elevated myocardial infarction) ([facility]) 10/19/07   successful cutting balloon atherotomy/stenting cypher stent 3.0x56m just beyond ostium prox. in the RCA. 10/23/07 cypher stents placed in LAD & CX  . OSA (obstructive sleep apnea)   . Sinus headache   . Sleep apnea     Past Surgical History:  Procedure Laterality Date  . CORONARY STENT PLACEMENT  10/23/07   successful PTCA/stenting of 90% LAD cypher stent 3.0x12mtaper of 3.4-3.70m39mleft cx obtuse marginal 1 cypher stent 2.5x170m72m. CORONARY STENT PLACEMENT  10/19/07   successful cutting balloon atherotomy/stenting cypher 3.0x170mm69mt beyond the ostium prox. in the RCA    Allergies  Allergen Reactions  . Penicillins Anaphylaxis    Current Outpatient Prescriptions  Medication Sig Dispense Refill  . aspirin 81 MG tablet Take 81 mg by mouth daily.    . Coenzyme Q10 (CO Q 10 PO) Take 1 tablet by mouth daily.    . enalapril (VASOTEC) 10 MG tablet Take 1 tablet (10 mg total) by mouth daily. 30 tablet 6  . Menthol-Methyl Salicylate (ICY HOT BALM EXTRA STRENGTH EX) Apply 1 application topically every 4 (four) hours as needed (for muscle aches).    . Multiple Vitamin (MULTIVITAMIN WITH MINERALS) TABS Take 1 tablet by mouth daily as needed (for vitamin). Nature's Made for men    . Plant Sterols and Stanols (CHOLEST OFF PO) Take 2 tablets by mouth 2 (two) times daily.    . metoprolol succinate (TOPROL-XL) 50 MG 24 hr tablet Take 1.5 tablets daily. 45 tablet 6   No current facility-administered medications for this visit.     Social History   Social History  . Marital status: Married    Spouse name: N/A  . Number of children: N/A  . Years of education: N/A  Occupational History  . Not on file.   Social History Main Topics  . Smoking status: Current Some Day Smoker    Packs/day: 0.50    Years: 10.00    Types: Cigarettes  . Smokeless tobacco: Never Used  . Alcohol use 2.4 oz/week    4 Glasses of wine per week     Comment: rarely  . Drug use: No  . Sexual activity: Not on file   Other Topics Concern  . Not on file   Social History Narrative  . No narrative on file    Family History  Problem Relation Age of Onset  . Diabetes Mother     ROS General: Negative; No fevers, chills, or night sweats HEENT: Negative; No changes in vision or hearing, sinus congestion, difficulty  swallowing Pulmonary: Negative; No cough, wheezing, shortness of breath, hemoptysis Cardiovascular: See HPI: No chest pain, presyncope, syncope, palpatations GI: Negative; No nausea, vomiting, diarrhea, or abdominal pain GU: Negative; No dysuria, hematuria, or difficulty voiding Musculoskeletal: Negative; no myalgias, joint pain, or weakness Hematologic: Negative; no easy bruising, bleeding Endocrine: Negative; no heat/cold intolerance; no diabetes, Neuro: Negative; no changes in balance, headaches Skin: Negative; No rashes or skin lesions Psychiatric: Negative; No behavioral problems, depression Sleep: Negative; No snoring,  daytime sleepiness, hypersomnolence, bruxism, restless legs, hypnogognic hallucinations. Other comprehensive 14 point system review is negative   Physical Exam BP 117/72 (BP Location: Right Arm, Patient Position: Sitting, Cuff Size: Normal)   Pulse (!) 50   Ht '6\' 5"'  (1.956 m)   Wt 254 lb 12.8 oz (115.6 kg)   SpO2 96%   BMI 30.21 kg/m    Repeat blood pressure by me was 122/76.  Wt Readings from Last 3 Encounters:  12/15/15 254 lb 12.8 oz (115.6 kg)  09/04/14 270 lb (122.5 kg)  07/29/14 279 lb (126.6 kg)   General: Alert, oriented, no distress.  Skin: normal turgor, no rashes, warm and dry HEENT: Normocephalic, atraumatic. Pupils equal round and reactive to light; sclera anicteric; extraocular muscles intact, No lid lag; Nose without nasal septal hypertrophy; Mouth/Parynx benign; Mallinpatti scale 2 Neck: No JVD, no carotid bruits; normal carotid upstroke Lungs: clear to ausculatation and percussion bilaterally; no wheezing or rales, normal inspiratory and expiratory effort Chest wall: without tenderness to palpitation Heart: PMI not displaced, RRR, s1 s2 normal, 1/6systolic murmur, No diastolic murmur, no rubs, gallops, thrills, or heaves Abdomen: soft, nontender; no hepatosplenomehaly, BS+; abdominal aorta nontender and not dilated by palpation. Back: no  CVA tenderness Pulses: 2+  Musculoskeletal: full range of motion, normal strength, no joint deformities Extremities: Pulses 2+, no clubbing cyanosis or edema, Homan's sign negative  Neurologic: grossly nonfocal; Cranial nerves grossly wnl Psychologic: Normal mood and affect  ECG (independently read by me): Sinus bradycardia 50 bpm.  June 2015 ECG (independently read by me): Sinus rhythm at 77 beats per minute.  No ectopy.  PR interval 156 ms; QTc interval 443 ms.  LABS: BMP Latest Ref Rng & Units 06/24/2014 10/17/2011 10/24/2007  Glucose 70 - 99 mg/dL 88 109(H) 104(H)  BUN 6 - 23 mg/dL '15 17 12  ' Creatinine 0.50 - 1.35 mg/dL 0.95 0.96 1.33  Sodium 135 - 145 mEq/L 136 135 139  Potassium 3.5 - 5.3 mEq/L 4.0 2.9(L) 4.1  Chloride 96 - 112 mEq/L 103 96 105  CO2 19 - 32 mEq/L '22 29 27  ' Calcium 8.4 - 10.5 mg/dL 9.8 9.9 9.1   Hepatic Function Latest Ref Rng & Units 06/24/2014  Total Protein  6.0 - 8.3 g/dL 7.6  Albumin 3.5 - 5.2 g/dL 4.7  AST 0 - 37 U/L 21  ALT 0 - 53 U/L 29  Alk Phosphatase 39 - 117 U/L 63  Total Bilirubin 0.2 - 1.2 mg/dL 0.6   CBC Latest Ref Rng & Units 06/24/2014 10/17/2011 10/24/2007  WBC 4.0 - 10.5 K/uL 8.4 10.8(H) 9.0  Hemoglobin 13.0 - 17.0 g/dL 17.4(H) 17.8(H) 14.3  Hematocrit 39.0 - 52.0 % 48.3 48.4 41.2  Platelets 150 - 400 K/uL 236 219 224   Lab Results  Component Value Date   MCV 89.1 06/24/2014   MCV 87.5 10/17/2011   MCV 90.9 10/24/2007   Lab Results  Component Value Date   TSH 1.531 06/24/2014   Lab Results  Component Value Date   TSH 1.531 06/24/2014   Lipid Panel     Component Value Date/Time   CHOL 200 06/24/2014 1535   TRIG 199 (H) 06/24/2014 1535   HDL 29 (L) 06/24/2014 1535   CHOLHDL 6.9 06/24/2014 1535   VLDL 40 06/24/2014 1535   LDLCALC 131 (H) 06/24/2014 1535    RADIOLOGY: No results found.    ASSESSMENT AND PLAN: Mr. Bohdan Macho is a 45 year old Caucasian male who suffered an acute coronary syndrome in August 2009 and underwent  culprit PCI to his RCA with staged intervention to his LAD and circumflex vessels.  Not seen him in over 2 years and since that evaluation, he discontinued statin therapy.  He reduced his aspirin to only 3 days per week, and self reduce his metoprolol tartrate to 100 mg once a day.  He had a prior history of significant tobacco abuse per night, had prescribed Wellbutrin in the past to help with smoking cessation.  He is now exercising regularly.  He has lost weight and from May 2016.  He has lost 28 pounds.  I commended him on this weight loss.  You'll need to get repeat blood work.  He states he needs to go to a clinic so that they can order the blood work and it can then be paid for by the clinic since he does not have insurance.  I suspect he will need reinstitution of statin therapy since his lipid studies were elevated when checked by a clinic 1 year ago.  I also discussed resuming aspirin 81 mg on a daily basis.  Since he has been taking short acting metoprolol 100 mg a day I have suggested we change this to metoprolol succinate 75 mg daily. I will contact him once a review his blood work.  I have recommended a six-month follow-up evaluation.   Time spent: 25 minutes  Troy Sine, MD, [facility]  12/17/2015 11:12 AM

## 2016-05-10 ENCOUNTER — Other Ambulatory Visit
Admission: RE | Admit: 2016-05-10 | Discharge: 2016-05-10 | Disposition: A | Discharge status: Home / Self Care | Payer: Self-pay | Source: Ambulatory Visit | Attending: Occupational Medicine | Admitting: Occupational Medicine

## 2016-10-02 ENCOUNTER — Other Ambulatory Visit: Payer: Self-pay | Admitting: Cardiovascular Disease

## 2016-10-02 DIAGNOSIS — I10 Essential (primary) hypertension: Secondary | ICD-10-CM

## 2016-10-03 NOTE — Telephone Encounter (Signed)
REFILL 

## 2016-11-15 ENCOUNTER — Other Ambulatory Visit: Payer: Self-pay | Admitting: Cardiovascular Disease

## 2016-11-15 DIAGNOSIS — I10 Essential (primary) hypertension: Secondary | ICD-10-CM

## 2016-12-11 ENCOUNTER — Other Ambulatory Visit: Payer: Self-pay | Admitting: Cardiovascular Disease

## 2016-12-11 DIAGNOSIS — I10 Essential (primary) hypertension: Secondary | ICD-10-CM

## 2017-01-13 ENCOUNTER — Other Ambulatory Visit: Payer: Self-pay | Admitting: *Deleted

## 2017-01-13 MED ORDER — METOPROLOL SUCCINATE ER 50 MG PO TB24: ORAL_TABLET | ORAL | 0 refills | Status: DC

## 2017-01-21 ENCOUNTER — Other Ambulatory Visit: Payer: Self-pay | Admitting: Cardiovascular Disease

## 2017-01-21 DIAGNOSIS — I10 Essential (primary) hypertension: Secondary | ICD-10-CM

## 2017-02-14 ENCOUNTER — Other Ambulatory Visit: Payer: Self-pay | Admitting: Cardiovascular Disease

## 2017-02-18 ENCOUNTER — Other Ambulatory Visit: Payer: Self-pay | Admitting: Cardiovascular Disease

## 2017-02-18 DIAGNOSIS — I10 Essential (primary) hypertension: Secondary | ICD-10-CM

## 2017-02-24 ENCOUNTER — Telehealth: Payer: Self-pay | Admitting: Cardiovascular Disease

## 2017-02-24 DIAGNOSIS — I10 Essential (primary) hypertension: Secondary | ICD-10-CM

## 2017-02-24 MED ORDER — ENALAPRIL MALEATE 10 MG PO TABS: 10.0000 mg | ORAL_TABLET | Freq: Every day | ORAL | 2 refills | Status: DC

## 2017-02-24 NOTE — Telephone Encounter (Signed)
°*  STAT* If patient is at the pharmacy, call can be transferred to refill team.   1. Which medications need to be refilled? (please list name of each medication and dose if known) enalaprill 10 mg  2. Which pharmacy/location (including street and city if local pharmacy) is medication to be sent to?[facility] [address] [place] - [address]  3. Do they need a 30 day or 90 day supply? 90  Patient scheduled for May 09, 2017 @ 3pm.

## 2017-02-24 NOTE — Telephone Encounter (Signed)
Rx(s) sent to pharmacy electronically.  

## 2017-03-28 ENCOUNTER — Telehealth: Payer: Self-pay | Admitting: Cardiovascular Disease

## 2017-03-28 NOTE — Telephone Encounter (Signed)
Patient called w/MD advice °

## 2017-03-28 NOTE — Telephone Encounter (Signed)
New Message:   Pt wants to know if he can have a MRI,since he have stents?

## 2017-03-28 NOTE — Telephone Encounter (Signed)
Per MD notes, patient had Cypher stents in August 2009. I was unable to locate cath report. Will route to MD and RN review and advise

## 2017-03-28 NOTE — Telephone Encounter (Signed)
If stents wereplaced in 2009, they should be well endothelialized. Okay for MRI

## 2017-04-14 ENCOUNTER — Other Ambulatory Visit: Payer: Self-pay | Admitting: Cardiovascular Disease

## 2017-04-14 NOTE — Telephone Encounter (Signed)
REFILL 

## 2017-05-09 ENCOUNTER — Ambulatory Visit: Payer: Self-pay | Admitting: Cardiovascular Disease

## 2017-05-29 ENCOUNTER — Other Ambulatory Visit: Payer: Self-pay | Admitting: Cardiovascular Disease

## 2017-05-29 DIAGNOSIS — I10 Essential (primary) hypertension: Secondary | ICD-10-CM

## 2017-05-29 NOTE — Telephone Encounter (Signed)
REFILL 

## 2017-06-22 ENCOUNTER — Other Ambulatory Visit: Payer: Self-pay | Admitting: Cardiovascular Disease

## 2017-06-23 NOTE — Telephone Encounter (Signed)
REFILL 

## 2017-07-02 ENCOUNTER — Encounter: Payer: Self-pay | Admitting: Cardiovascular Disease

## 2017-07-04 ENCOUNTER — Ambulatory Visit: Payer: Self-pay | Admitting: Cardiovascular Disease

## 2017-09-09 ENCOUNTER — Other Ambulatory Visit: Payer: Self-pay | Admitting: Cardiovascular Disease

## 2017-10-27 ENCOUNTER — Other Ambulatory Visit: Payer: Self-pay | Admitting: Cardiovascular Disease

## 2017-10-27 DIAGNOSIS — I10 Essential (primary) hypertension: Secondary | ICD-10-CM

## 2017-11-04 ENCOUNTER — Ambulatory Visit: Payer: Self-pay | Admitting: Cardiovascular Disease

## 2017-11-11 ENCOUNTER — Other Ambulatory Visit: Payer: Self-pay | Admitting: Cardiovascular Disease

## 2017-11-24 NOTE — Progress Notes (Deleted)
Cardiology Office Note:    Date:  11/24/2017   ID:  Isaac Goodwin, DOB 03-14-1970, MRN [id number]  PCP:  Isaac Maroon, FNP  Cardiologist:  No primary care provider on file.   Referring MD: Isaac Maroon, FNP   No chief complaint on file. ***  History of Present Illness:    Isaac Goodwin is a 47 y.o. male with a hx of CAD with hx of NSTEMI with 3 v disease; RCA treated with cutting balloon and stent placement with staged intervention and stents placed in LAD and Cx; HTN, HLD,tobacco abuse, and degenerative joint disease. He was last seen in clinic with Dr. [name] 12/15/15. At that time, he had stopped his statin, was only taking ASA three times weekly, and was taking metoprolol tartrate 100 mg daily instead of BID. Repeat blood work was ordered, but it does not appear that this was completed. He was asked to follow up in 6 months, but has not been seen since.  He returns today for follow up.        Past Medical History:  Diagnosis Date  . Coronary artery disease   . Hypercholesteremia   . Hypertension   . Non-STEMI (non-ST elevated myocardial infarction) ([facility]) 10/19/07   successful cutting balloon atherotomy/stenting cypher stent 3.0x60mm just beyond ostium prox. in the RCA. 10/23/07 cypher stents placed in LAD & CX  . OSA (obstructive sleep apnea)   . Sinus headache   . Sleep apnea     Past Surgical History:  Procedure Laterality Date  . CORONARY STENT PLACEMENT  10/23/07   successful PTCA/stenting of 90% LAD cypher stent 3.0x32mm taper of 3.4-3.74mm, left cx obtuse marginal 1 cypher stent 2.5x61mm   . CORONARY STENT PLACEMENT  10/19/07   successful cutting balloon atherotomy/stenting cypher 3.0x61mm just beyond the ostium prox. in the RCA    Current Medications: No outpatient medications have been marked as taking for the 11/25/17 encounter (Appointment) with Marcelino Duster, PA.     Allergies:   Penicillins   Social History   Socioeconomic History  . Marital  status: Married    Spouse name: Not on file  . Number of children: Not on file  . Years of education: Not on file  . Highest education level: Not on file  Occupational History  . Not on file  Social Needs  . Financial resource strain: Not on file  . Food insecurity:    Worry: Not on file    Inability: Not on file  . Transportation needs:    Medical: Not on file    Non-medical: Not on file  Tobacco Use  . Smoking status: Current Some Day Smoker    Packs/day: 0.50    Years: 10.00    Pack years: 5.00    Types: Cigarettes  . Smokeless tobacco: Never Used  Substance and Sexual Activity  . Alcohol use: Yes    Alcohol/week: 4.0 standard drinks    Types: 4 Glasses of wine per week    Comment: rarely  . Drug use: No  . Sexual activity: Not on file  Lifestyle  . Physical activity:    Days per week: Not on file    Minutes per session: Not on file  . Stress: Not on file  Relationships  . Social connections:    Talks on phone: Not on file    Gets together: Not on file    Attends religious service: Not on file    Active member of club or organization:  Not on file    Attends meetings of clubs or organizations: Not on file    Relationship status: Not on file  Other Topics Concern  . Not on file  Social History Narrative  . Not on file     Family History: The patient's ***family history includes Diabetes in his mother.  ROS:   Please see the history of present illness.    *** All other systems reviewed and are negative.  EKGs/Labs/Other Studies Reviewed:    The following studies were reviewed today: ***  EKG:  EKG is *** ordered today.  The ekg ordered today demonstrates ***  Recent Labs: No results found for requested labs within last 8760 hours.  Recent Lipid Panel    Component Value Date/Time   CHOL 200 06/24/2014 1535   TRIG 199 (H) 06/24/2014 1535   HDL 29 (L) 06/24/2014 1535   CHOLHDL 6.9 06/24/2014 1535   VLDL 40 06/24/2014 1535   LDLCALC 131 (H)  06/24/2014 1535    Physical Exam:    VS:  There were no vitals taken for this visit.    Wt Readings from Last 3 Encounters:  12/15/15 254 lb 12.8 oz (115.6 kg)  09/04/14 270 lb (122.5 kg)  07/29/14 279 lb (126.6 kg)     GEN: *** Well nourished, well developed in no acute distress HEENT: Normal NECK: No JVD; No carotid bruits LYMPHATICS: No lymphadenopathy CARDIAC: ***RRR, no murmurs, rubs, gallops RESPIRATORY:  Clear to auscultation without rales, wheezing or rhonchi  ABDOMEN: Soft, non-tender, non-distended MUSCULOSKELETAL:  No edema; No deformity  SKIN: Warm and dry NEUROLOGIC:  Alert and oriented x 3 PSYCHIATRIC:  Normal affect   ASSESSMENT:    No diagnosis found. PLAN:    In order of problems listed above:  No diagnosis found.   Medication Adjustments/Labs and Tests Ordered: Current medicines are reviewed at length with the patient today.  Concerns regarding medicines are outlined above.  No orders of the defined types were placed in this encounter.  No orders of the defined types were placed in this encounter.   Signed, Marcelino Duster, PA  11/24/2017 12:08 PM    [facility]

## 2017-11-25 ENCOUNTER — Ambulatory Visit: Payer: Self-pay | Admitting: Physician Assistant

## 2017-12-25 ENCOUNTER — Ambulatory Visit: Payer: Self-pay | Admitting: Physician Assistant

## 2017-12-29 ENCOUNTER — Ambulatory Visit: Payer: Self-pay | Admitting: Adult Health

## 2017-12-29 DIAGNOSIS — R0989 Other specified symptoms and signs involving the circulatory and respiratory systems: Secondary | ICD-10-CM

## 2017-12-29 NOTE — Progress Notes (Deleted)
Cardiology Office Note   Date:  12/29/2017   ID:  Isaac Goodwin, DOB 1970/06/30, MRN [id number]  PCP:  Massie Maroon, FNP  Cardiologist:  Roselie Awkward chief complaint on file.    History of Present Illness: Isaac Goodwin is a 47 y.o. male who presents for ongoing assessment and management of CAD, with DES to RCA, and staged 2 vessel intervention to the LAD and Cx with DES. Other history includes HTN, HL, tobacco abuse and DDD of C5 and C6.   He was last seen by Dr. [name] on 12/15/2015. He has had labs to include lipids and CMET. He was changed to metoprolol succinate 75 mg daily. He was to be started on statin.     Past Medical History:  Diagnosis Date  . Coronary artery disease   . Hypercholesteremia   . Hypertension   . Non-STEMI (non-ST elevated myocardial infarction) (HCC) 10/19/07   successful cutting balloon atherotomy/stenting cypher stent 3.0x38mm just beyond ostium prox. in the RCA. 10/23/07 cypher stents placed in LAD & CX  . OSA (obstructive sleep apnea)   . Sinus headache   . Sleep apnea     Past Surgical History:  Procedure Laterality Date  . CORONARY STENT PLACEMENT  10/23/07   successful PTCA/stenting of 90% LAD cypher stent 3.0x73mm taper of 3.4-3.68mm, left cx obtuse marginal 1 cypher stent 2.5x51mm   . CORONARY STENT PLACEMENT  10/19/07   successful cutting balloon atherotomy/stenting cypher 3.0x57mm just beyond the ostium prox. in the RCA     Current Outpatient Medications  Medication Sig Dispense Refill  . aspirin 81 MG tablet Take 81 mg by mouth daily.    . Coenzyme Q10 (CO Q 10 PO) Take 1 tablet by mouth daily.    . enalapril (VASOTEC) 10 MG tablet TAKE ONE TABLET BY MOUTH DAILY   MUST KEEP OFFICE VIST 30 tablet 0  . Menthol-Methyl Salicylate (ICY HOT BALM EXTRA STRENGTH EX) Apply 1 application topically every 4 (four) hours as needed (for muscle aches).    . metoprolol succinate (TOPROL-XL) 50 MG 24 hr tablet TAKE ONE TABLET BY MOUTH DAILY; MUST KEEP  APPOINTMENT 45 tablet 0  . Multiple Vitamin (MULTIVITAMIN WITH MINERALS) TABS Take 1 tablet by mouth daily as needed (for vitamin). Nature's Made for men    . Plant Sterols and Stanols (CHOLEST OFF PO) Take 2 tablets by mouth 2 (two) times daily.     No current facility-administered medications for this visit.     Allergies:   Penicillins    Social History:  The patient  reports that he has been smoking cigarettes. He has a 5.00 pack-year smoking history. He has never used smokeless tobacco. He reports that he drinks about 4.0 standard drinks of alcohol per week. He reports that he does not use drugs.   Family History:  The patient's family history includes Diabetes in his mother.    ROS: All other systems are reviewed and negative. Unless otherwise mentioned in H&P    PHYSICAL EXAM: VS:  There were no vitals taken for this visit. , BMI There is no height or weight on file to calculate BMI. GEN: Well nourished, well developed, in no acute distress HEENT: normal Neck: no JVD, carotid bruits, or masses Cardiac: ***RRR; no murmurs, rubs, or gallops,no edema  Respiratory:  Clear to auscultation bilaterally, normal work of breathing GI: soft, nontender, nondistended, + BS MS: no deformity or atrophy Skin: warm and dry, no rash Neuro:  Strength and sensation are  intact Psych: euthymic mood, full affect   EKG:  EKG {ACTION; IS/IS OAC:[id number]} ordered today. The ekg ordered today demonstrates ***   Recent Labs: No results found for requested labs within last 8760 hours.    Lipid Panel    Component Value Date/Time   CHOL 200 06/24/2014 1535   TRIG 199 (H) 06/24/2014 1535   HDL 29 (L) 06/24/2014 1535   CHOLHDL 6.9 06/24/2014 1535   VLDL 40 06/24/2014 1535   LDLCALC 131 (H) 06/24/2014 1535      Wt Readings from Last 3 Encounters:  12/15/15 254 lb 12.8 oz (115.6 kg)  09/04/14 270 lb (122.5 kg)  07/29/14 279 lb (126.6 kg)      Other studies Reviewed: Additional  studies/ records that were reviewed today include: ***. Review of the above records demonstrates: ***   ASSESSMENT AND PLAN:  1.  ***   Current medicines are reviewed at length with the patient today.    Labs/ tests ordered today include: *** Bettey Mare. Liborio Nixon, ANP, AACC   12/29/2017 7:59 AM    [facility] [address] Office [phone] Fax [phone]

## 2018-01-01 ENCOUNTER — Encounter: Payer: Self-pay | Admitting: Adult Health

## 2018-01-06 ENCOUNTER — Other Ambulatory Visit: Payer: Self-pay | Admitting: Cardiovascular Disease

## 2018-01-06 DIAGNOSIS — I10 Essential (primary) hypertension: Secondary | ICD-10-CM

## 2018-01-21 ENCOUNTER — Other Ambulatory Visit: Payer: Self-pay | Admitting: Cardiovascular Disease

## 2018-01-23 ENCOUNTER — Other Ambulatory Visit: Payer: Self-pay

## 2018-01-23 DIAGNOSIS — I10 Essential (primary) hypertension: Secondary | ICD-10-CM

## 2018-01-23 MED ORDER — ENALAPRIL MALEATE 10 MG PO TABS: ORAL_TABLET | ORAL | 0 refills | Status: DC

## 2018-02-10 ENCOUNTER — Other Ambulatory Visit: Payer: Self-pay | Admitting: Cardiovascular Disease

## 2018-02-10 DIAGNOSIS — I10 Essential (primary) hypertension: Secondary | ICD-10-CM

## 2018-03-09 ENCOUNTER — Other Ambulatory Visit: Payer: Self-pay | Admitting: Cardiovascular Disease

## 2018-03-09 DIAGNOSIS — I10 Essential (primary) hypertension: Secondary | ICD-10-CM

## 2018-04-16 ENCOUNTER — Other Ambulatory Visit: Payer: Self-pay | Admitting: Cardiovascular Disease

## 2018-04-16 DIAGNOSIS — I10 Essential (primary) hypertension: Secondary | ICD-10-CM

## 2018-04-16 NOTE — Telephone Encounter (Signed)
Rx(s) sent to pharmacy electronically.  

## 2018-05-24 ENCOUNTER — Other Ambulatory Visit: Payer: Self-pay | Admitting: Cardiovascular Disease

## 2018-05-24 DIAGNOSIS — I10 Essential (primary) hypertension: Secondary | ICD-10-CM

## 2018-05-25 NOTE — Telephone Encounter (Signed)
Toprol and enalapril refilled.

## 2018-06-25 ENCOUNTER — Other Ambulatory Visit: Payer: Self-pay | Admitting: Cardiovascular Disease

## 2018-06-25 DIAGNOSIS — I10 Essential (primary) hypertension: Secondary | ICD-10-CM

## 2018-06-30 ENCOUNTER — Telehealth: Payer: Self-pay | Admitting: Cardiovascular Disease

## 2018-06-30 NOTE — Telephone Encounter (Signed)
LMTCB to move appt with Dr. [name] to 12:40 pm today. Pt currently scheduled for tomorrow at 3:00 PM. Also to change visit to telephone or video.

## 2018-06-30 NOTE — Telephone Encounter (Signed)
Mychart, smartphone, consent, pre reg complete 06/30/18 AF °

## 2018-07-01 ENCOUNTER — Encounter: Payer: Self-pay | Admitting: Cardiovascular Disease

## 2018-07-01 ENCOUNTER — Telehealth (INDEPENDENT_AMBULATORY_CARE_PROVIDER_SITE_OTHER): Payer: Self-pay | Admitting: Cardiovascular Disease

## 2018-07-01 VITALS — BP 124/86 | Ht 77.0 in | Wt 258.0 lb

## 2018-07-01 DIAGNOSIS — Z720 Tobacco use: Secondary | ICD-10-CM

## 2018-07-01 DIAGNOSIS — E785 Hyperlipidemia, unspecified: Secondary | ICD-10-CM

## 2018-07-01 DIAGNOSIS — E559 Vitamin D deficiency, unspecified: Secondary | ICD-10-CM

## 2018-07-01 DIAGNOSIS — I10 Essential (primary) hypertension: Secondary | ICD-10-CM

## 2018-07-01 DIAGNOSIS — I2510 Atherosclerotic heart disease of native coronary artery without angina pectoris: Secondary | ICD-10-CM

## 2018-07-01 NOTE — Patient Instructions (Signed)
Medication Instructions:  The current medical regimen is effective;  continue present plan and medications.  If you need a refill on your cardiac medications before your next appointment, please call your pharmacy.   Lab work: CMET, Lipid, TSH, CBC, Vitamin D If you have labs (blood work) drawn today and your tests are completely normal, you will receive your results only by: [name] MyChart Message (if you have MyChart) OR . A paper copy in the mail If you have any lab test that is abnormal or we need to change your treatment, we will call you to review the results.   Follow-Up: At [facility], you and your health needs are our priority.  As part of our continuing mission to provide you with exceptional heart care, we have created designated Provider Care Teams.  These Care Teams include your primary Cardiologist (physician) and Advanced Practice Providers (APPs -  Physician Assistants and Nurse Practitioners) who all work together to provide you with the care you need, when you need it. You will need a follow up appointment in 6 months.  Please call our office 2 months in advance to schedule this appointment.  You may see Dr.Kelly or one of the following Advanced Practice Providers on your designated Care Team: Azalee Course, [place] . Micah Flesher, PA-C
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
## 2018-07-25 ENCOUNTER — Other Ambulatory Visit: Payer: Self-pay | Admitting: Cardiovascular Disease

## 2018-07-25 DIAGNOSIS — I10 Essential (primary) hypertension: Secondary | ICD-10-CM

## 2018-11-05 ENCOUNTER — Other Ambulatory Visit: Payer: Self-pay | Admitting: Cardiovascular Disease

## 2018-11-05 DIAGNOSIS — I10 Essential (primary) hypertension: Secondary | ICD-10-CM

## 2019-01-21 ENCOUNTER — Telehealth: Payer: Self-pay | Admitting: Cardiovascular Disease

## 2019-01-21 NOTE — Telephone Encounter (Signed)
Patient received a phone call but no voicemail from our office. No one documented an attempt to call, so it was unclear what the office wanted. He was just returning the call.

## 2019-01-21 NOTE — Telephone Encounter (Signed)
LOOKS LIKE THE ONLY THING I COULD FIND IS THAT PT NEEDS FOLLOW UP APPT HE HAD NO-SHW FOR Isaac Goodwin IN NOV  LM2CB

## 2019-07-01 ENCOUNTER — Telehealth: Payer: Self-pay | Admitting: Cardiovascular Disease

## 2019-07-01 NOTE — Telephone Encounter (Signed)
   Pt c/o medication issue:  1. Name of Medication: wellbutrin  2. How are you currently taking this medication (dosage and times per day)?   3. Are you having a reaction (difficulty breathing--STAT)?   4. What is your medication issue? Pt is wondering if Dr. [name] can prescribe this medication again, he said he is ready to stop smoking again and this drug helped him last time

## 2019-07-01 NOTE — Telephone Encounter (Signed)
Called the pt and advised him that Dr. [name] is unavailable until mid next week... Pt says it is urgent that he gets the med to quit smoking and he does not have a PCP.. he says that he is not willing to spend money on a PCP appt for a med that already costs him a lot of money.   I advised him that will send him the message and will let him know when he has a chance to respond and call him as soon as we hear back next week.

## 2019-07-07 MED ORDER — BUPROPION HCL ER (SR) 150 MG PO TB12: ORAL_TABLET | ORAL | 0 refills | Status: DC

## 2019-07-07 NOTE — Telephone Encounter (Signed)
Called and spoke with pt, he states that in the last few months his daughter has almost lost her life 3 times. He reports this has caused a lot of stress for him and he would like to quit smoking. He states that with high stress moments his BP will spike and he states that he can now see his BP getting "aggrivated" He reports having been on Wellbutrin for smoking cessation many years ago.  Reviewed Dr.Kelly's recommendation for Wellbutrin. Notified he needs to set up a "quit date" for when he will stop smoking at least after the first week.  Pt asking if it is okay to use a nicotine patch when he does quit the smoking but while still on the Wellbutrin. I spoke with Dr.Kelly who stated he would rather him just delay his "quit date" instead. Reviewed with pt. Notified if he needed to extend his initial week and set his quit date at a later date this was fine, just to increase the Wellbutrin to the 150mg  twice a day when he does quit.  Pt verbalized understanding. Pt questioning about nicotine gum with breakthrough cravings but stated he would "look into it"  Pt had no other questions at this time.  Will send prescription to pts preferred pharmacy.

## 2019-07-07 NOTE — Telephone Encounter (Signed)
Spoke with Dr.Kelly, okay to prescribe Wellbutrin for smoking cessation. Begin taking wellbutrin 150 mg daily for the first week while trying to decrease smoking habit. After this, increase to 150mg  twice a day for weeks 2-8.

## 2019-08-26 NOTE — Telephone Encounter (Signed)
In error

## 2019-09-27 ENCOUNTER — Other Ambulatory Visit: Payer: Self-pay

## 2019-09-27 MED ORDER — BUPROPION HCL ER (SR) 150 MG PO TB12: ORAL_TABLET | ORAL | 0 refills | Status: DC

## 2019-12-28 ENCOUNTER — Other Ambulatory Visit: Payer: Self-pay | Admitting: Cardiovascular Disease

## 2019-12-28 DIAGNOSIS — I10 Essential (primary) hypertension: Secondary | ICD-10-CM

## 2020-01-11 ENCOUNTER — Observation Stay (HOSPITAL_COMMUNITY)
Admission: EM | Admit: 2020-01-11 | Discharge: 2020-01-13 | Disposition: A | Discharge status: Home / Self Care | Payer: Medicaid Other | Attending: Emergency Medicine | Admitting: Emergency Medicine

## 2020-01-11 ENCOUNTER — Emergency Department (HOSPITAL_COMMUNITY): Payer: Medicaid Other

## 2020-01-11 ENCOUNTER — Other Ambulatory Visit: Payer: Self-pay

## 2020-01-11 DIAGNOSIS — Y92410 Unspecified street and highway as the place of occurrence of the external cause: Secondary | ICD-10-CM | POA: Insufficient documentation

## 2020-01-11 DIAGNOSIS — Z20822 Contact with and (suspected) exposure to covid-19: Secondary | ICD-10-CM | POA: Insufficient documentation

## 2020-01-11 DIAGNOSIS — I119 Hypertensive heart disease without heart failure: Secondary | ICD-10-CM | POA: Insufficient documentation

## 2020-01-11 DIAGNOSIS — R079 Chest pain, unspecified: Secondary | ICD-10-CM | POA: Insufficient documentation

## 2020-01-11 DIAGNOSIS — I2510 Atherosclerotic heart disease of native coronary artery without angina pectoris: Secondary | ICD-10-CM | POA: Diagnosis not present

## 2020-01-11 DIAGNOSIS — S299XXA Unspecified injury of thorax, initial encounter: Secondary | ICD-10-CM | POA: Diagnosis present

## 2020-01-11 DIAGNOSIS — S298XXA Other specified injuries of thorax, initial encounter: Secondary | ICD-10-CM

## 2020-01-11 DIAGNOSIS — J982 Interstitial emphysema: Secondary | ICD-10-CM | POA: Diagnosis not present

## 2020-01-11 DIAGNOSIS — S2242XA Multiple fractures of ribs, left side, initial encounter for closed fracture: Secondary | ICD-10-CM | POA: Diagnosis not present

## 2020-01-11 DIAGNOSIS — F17210 Nicotine dependence, cigarettes, uncomplicated: Secondary | ICD-10-CM | POA: Diagnosis not present

## 2020-01-11 DIAGNOSIS — J939 Pneumothorax, unspecified: Secondary | ICD-10-CM | POA: Diagnosis not present

## 2020-01-11 LAB — CBC
HCT: 53.3 % — ABNORMAL HIGH (ref 39.0–52.0)
Hemoglobin: 17.4 g/dL — ABNORMAL HIGH (ref 13.0–17.0)
MCH: 30.7 pg (ref 26.0–34.0)
MCHC: 32.6 g/dL (ref 30.0–36.0)
MCV: 94 fL (ref 80.0–100.0)
Platelets: 279 10*3/uL (ref 150–400)
RBC: 5.67 MIL/uL (ref 4.22–5.81)
RDW: 12.9 % (ref 11.5–15.5)
WBC: 11.2 10*3/uL — ABNORMAL HIGH (ref 4.0–10.5)
nRBC: 0 % (ref 0.0–0.2)

## 2020-01-11 LAB — I-STAT CHEM 8, ED
BUN: 23 mg/dL — ABNORMAL HIGH (ref 6–20)
Calcium, Ion: 1.24 mmol/L (ref 1.15–1.40)
Chloride: 106 mmol/L (ref 98–111)
Creatinine, Ser: 1 mg/dL (ref 0.61–1.24)
Glucose, Bld: 120 mg/dL — ABNORMAL HIGH (ref 70–99)
HCT: 50 % (ref 39.0–52.0)
Hemoglobin: 17 g/dL (ref 13.0–17.0)
Potassium: 4.1 mmol/L (ref 3.5–5.1)
Sodium: 142 mmol/L (ref 135–145)
TCO2: 24 mmol/L (ref 22–32)

## 2020-01-11 LAB — SAMPLE TO BLOOD BANK

## 2020-01-11 LAB — COMPREHENSIVE METABOLIC PANEL
ALT: 31 U/L (ref 0–44)
AST: 26 U/L (ref 15–41)
Albumin: 3.8 g/dL (ref 3.5–5.0)
Alkaline Phosphatase: 50 U/L (ref 38–126)
Anion gap: 9 (ref 5–15)
BUN: 18 mg/dL (ref 6–20)
CO2: 23 mmol/L (ref 22–32)
Calcium: 9.4 mg/dL (ref 8.9–10.3)
Chloride: 108 mmol/L (ref 98–111)
Creatinine, Ser: 1.13 mg/dL (ref 0.61–1.24)
GFR, Estimated: >60 mL/min (ref >60)
Glucose, Bld: 124 mg/dL — ABNORMAL HIGH (ref 70–99)
Potassium: 4 mmol/L (ref 3.5–5.1)
Sodium: 140 mmol/L (ref 135–145)
Total Bilirubin: 0.6 mg/dL (ref 0.3–1.2)
Total Protein: 6.3 g/dL — ABNORMAL LOW (ref 6.5–8.1)

## 2020-01-11 LAB — ETHANOL: Alcohol, Ethyl (B): <10 mg/dL (ref <10)

## 2020-01-11 LAB — PROTIME-INR
INR: 1 (ref 0.8–1.2)
Prothrombin Time: 12.4 seconds (ref 11.4–15.2)

## 2020-01-11 LAB — HIV ANTIBODY (ROUTINE TESTING W REFLEX): HIV Screen 4th Generation wRfx: NONREACTIVE

## 2020-01-11 MED ORDER — OXYCODONE HCL 5 MG PO TABS
10.0000 mg | ORAL_TABLET | ORAL | Status: DC | PRN
  Administered 2020-01-11 – 2020-01-12 (×3): 10 mg via ORAL
  Filled 2020-01-11 (×3): qty 2

## 2020-01-11 MED ORDER — ENOXAPARIN SODIUM 30 MG/0.3ML ~~LOC~~ SOLN
30.0000 mg | Freq: Two times a day (BID) | SUBCUTANEOUS | Status: DC
  Administered 2020-01-12: 30 mg via SUBCUTANEOUS
  Filled 2020-01-11 (×2): qty 0.3

## 2020-01-11 MED ORDER — HYDROMORPHONE HCL 1 MG/ML IJ SOLN
1.0000 mg | Freq: Once | INTRAMUSCULAR | Status: AC
  Administered 2020-01-11: 1 mg via INTRAVENOUS
  Filled 2020-01-11: qty 1

## 2020-01-11 MED ORDER — ACETAMINOPHEN 325 MG PO TABS: 650.0000 mg | ORAL_TABLET | ORAL | Status: DC | PRN

## 2020-01-11 MED ORDER — HYDROMORPHONE HCL 1 MG/ML IJ SOLN
0.5000 mg | INTRAMUSCULAR | Status: DC | PRN
  Administered 2020-01-11 – 2020-01-12 (×5): 0.5 mg via INTRAVENOUS
  Filled 2020-01-11 (×5): qty 1

## 2020-01-11 MED ORDER — ONDANSETRON 4 MG PO TBDP: 4.0000 mg | ORAL_TABLET | Freq: Four times a day (QID) | ORAL | Status: DC | PRN

## 2020-01-11 MED ORDER — IOHEXOL 300 MG/ML  SOLN
100.0000 mL | Freq: Once | INTRAMUSCULAR | Status: AC | PRN
  Administered 2020-01-11: 100 mL via INTRAVENOUS

## 2020-01-11 MED ORDER — HYDROMORPHONE HCL 1 MG/ML IJ SOLN
INTRAMUSCULAR | Status: AC
  Filled 2020-01-11: qty 1

## 2020-01-11 MED ORDER — ONDANSETRON HCL 4 MG/2ML IJ SOLN: 4.0000 mg | Freq: Four times a day (QID) | INTRAMUSCULAR | Status: DC | PRN

## 2020-01-11 MED ORDER — METHOCARBAMOL 500 MG PO TABS
500.0000 mg | ORAL_TABLET | Freq: Four times a day (QID) | ORAL | Status: DC | PRN
  Administered 2020-01-11 – 2020-01-12 (×3): 500 mg via ORAL
  Filled 2020-01-11 (×3): qty 1

## 2020-01-11 MED ORDER — ONDANSETRON HCL 4 MG/2ML IJ SOLN
INTRAMUSCULAR | Status: AC
  Filled 2020-01-11: qty 2

## 2020-01-11 NOTE — ED Notes (Signed)
Attempted to give report, phone number was left with Diplomatic Services operational officer, charge nurse Dow Adolph to call back.

## 2020-01-11 NOTE — ED Notes (Signed)
Pt ambulatory to bathroom steady gait
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
## 2020-01-11 NOTE — ED Notes (Signed)
Patient Transport requested

## 2020-01-11 NOTE — ED Notes (Signed)
Patients father Greggory Stallion wants an update call back number is [phone]
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
## 2020-01-11 NOTE — ED Notes (Signed)
Pt transported to CT ?
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
## 2020-01-12 ENCOUNTER — Observation Stay (HOSPITAL_COMMUNITY): Payer: Medicaid Other

## 2020-01-12 LAB — SARS CORONAVIRUS 2 BY RT PCR (HOSPITAL ORDER, PERFORMED IN ~~LOC~~ HOSPITAL LAB): SARS Coronavirus 2: NEGATIVE

## 2020-01-12 LAB — MRSA PCR SCREENING: MRSA by PCR: NEGATIVE

## 2020-01-12 MED ORDER — HYDROMORPHONE HCL 1 MG/ML IJ SOLN
0.5000 mg | INTRAMUSCULAR | Status: DC | PRN
  Administered 2020-01-12 – 2020-01-13 (×3): 0.5 mg via INTRAVENOUS
  Filled 2020-01-12 (×3): qty 1

## 2020-01-12 MED ORDER — OXYCODONE-ACETAMINOPHEN 5-325 MG PO TABS
1.0000 | ORAL_TABLET | ORAL | Status: DC | PRN
  Administered 2020-01-12: 2 via ORAL
  Filled 2020-01-12: qty 2

## 2020-01-12 MED ORDER — ACETAMINOPHEN 500 MG PO TABS: 1000.0000 mg | ORAL_TABLET | Freq: Three times a day (TID) | ORAL | Status: DC

## 2020-01-12 MED ORDER — OXYCODONE HCL 5 MG PO TABS
10.0000 mg | ORAL_TABLET | ORAL | Status: DC | PRN
  Administered 2020-01-12 – 2020-01-13 (×5): 15 mg via ORAL
  Filled 2020-01-12 (×5): qty 3

## 2020-01-12 MED ORDER — LIDOCAINE 5 % EX PTCH
1.0000 | MEDICATED_PATCH | CUTANEOUS | Status: DC
  Filled 2020-01-12: qty 1

## 2020-01-12 MED ORDER — HYDROMORPHONE HCL 1 MG/ML IJ SOLN
0.5000 mg | INTRAMUSCULAR | Status: DC | PRN
  Administered 2020-01-12: 1 mg via INTRAVENOUS
  Filled 2020-01-12: qty 1

## 2020-01-12 MED ORDER — ACETAMINOPHEN 325 MG PO TABS: 650.0000 mg | ORAL_TABLET | Freq: Three times a day (TID) | ORAL | Status: DC | PRN

## 2020-01-12 MED ORDER — ENALAPRIL MALEATE 5 MG PO TABS
10.0000 mg | ORAL_TABLET | Freq: Every day | ORAL | Status: DC
  Filled 2020-01-12: qty 2

## 2020-01-12 MED ORDER — METHOCARBAMOL 750 MG PO TABS
750.0000 mg | ORAL_TABLET | Freq: Three times a day (TID) | ORAL | Status: DC
  Administered 2020-01-12 – 2020-01-13 (×3): 750 mg via ORAL
  Filled 2020-01-12 (×3): qty 1

## 2020-01-12 MED ORDER — OXYCODONE HCL 5 MG PO TABS: 5.0000 mg | ORAL_TABLET | ORAL | Status: DC | PRN

## 2020-01-12 MED ORDER — METOPROLOL SUCCINATE ER 50 MG PO TB24
50.0000 mg | ORAL_TABLET | Freq: Every day | ORAL | Status: DC
  Administered 2020-01-12: 50 mg via ORAL
  Filled 2020-01-12: qty 1

## 2020-01-12 MED ORDER — ACETAMINOPHEN 500 MG PO TABS
1000.0000 mg | ORAL_TABLET | Freq: Three times a day (TID) | ORAL | Status: DC
  Administered 2020-01-12: 1000 mg via ORAL
  Filled 2020-01-12: qty 2
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
## 2020-01-12 NOTE — Progress Notes (Signed)
Pt arrived on 4NP07 via bed. VS stable, A+Ox4, IV's intact and saline locked. Pt stated his pain was 20/10. PRN medications given. Pt refused sacral foam, SCD's, gown and non slip socks. Pt oriented to unit, bed in lowest position, call bell in reach. Pt verbalized that he was upset that it took so long to get placed in a room and that he had not ate all day. This RN microwaved frozen 2 meals for pt. Pt stated he was grateful for this RN.
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
## 2020-01-13 MED ORDER — ACETAMINOPHEN 500 MG PO TABS: 1000.0000 mg | ORAL_TABLET | Freq: Three times a day (TID) | ORAL | Status: DC | PRN

## 2020-01-13 MED ORDER — OXYCODONE HCL 10 MG PO TABS
10.0000 mg | ORAL_TABLET | ORAL | 0 refills | Status: DC | PRN
Start: 2020-01-13 — End: 2020-01-25

## 2020-01-13 MED ORDER — METHOCARBAMOL 500 MG PO TABS: 1000.0000 mg | ORAL_TABLET | Freq: Four times a day (QID) | ORAL | 0 refills | Status: DC
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
## 2020-01-13 NOTE — Discharge Instructions (Signed)
Rib Fracture  A rib fracture is a break or crack in one of the bones of the ribs. The ribs are like a cage that goes around your upper chest. A broken or cracked rib is often painful, but most do not cause other problems. Most rib fractures usually heal on their own in 1-3 months. Follow these instructions at home: Managing pain, stiffness, and swelling  If directed, apply ice to the injured area. ? Put ice in a plastic bag. ? Place a towel between your skin and the bag. ? Leave the ice on for 20 minutes, 2-3 times a day.  Take over-the-counter and prescription medicines only as told by your doctor. Activity  Avoid activities that cause pain to the injured area. Protect your injured area.  Slowly increase activity as told by your doctor. General instructions  Do deep breathing as told by your doctor. You may be told to: ? Take deep breaths many times a day. ? Cough many times a day while hugging a pillow. ? Use a device (incentive spirometer) to do deep breathing many times a day.  Drink enough fluid to keep your pee (urine) clear or pale yellow.  Do not wear a rib belt or binder. These do not allow you to breathe deeply.  Keep all follow-up visits as told by your doctor. This is important. Contact a doctor if:  You have a fever. Get help right away if:  You have trouble breathing.  You are short of breath.  You cannot stop coughing.  You cough up thick or bloody spit (sputum).  You feel sick to your stomach (nauseous), throw up (vomit), or have belly (abdominal) pain.  Your pain gets worse and medicine does not help. Summary  A rib fracture is a break or crack in one of the bones of the ribs.  Apply ice to the injured area and take medicines for pain as told by your doctor.  Take deep breaths and cough many times a day. Hug a pillow every time you cough. This information is not intended to replace advice given to you by your health care provider. Make sure you  discuss any questions you have with your health care provider. Document Revised: 01/17/2017 Document Reviewed: 05/07/2016 Elsevier Patient Education  2020 [employer]  
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
## 2020-01-13 NOTE — Plan of Care (Signed)

## 2020-01-13 NOTE — Plan of Care (Signed)
°  Problem: Education: Goal: Knowledge of General Education information will improve Description: Including pain rating scale, medication(s)/side effects and non-pharmacologic comfort measures 01/13/2020 1008 by Tracie Harrier, RN Outcome: Adequate for Discharge 01/13/2020 0921 by Tracie Harrier, RN Outcome: Progressing   Problem: Health Behavior/Discharge Planning: Goal: Ability to manage health-related needs will improve 01/13/2020 1008 by Tracie Harrier, RN Outcome: Adequate for Discharge 01/13/2020 0921 by Tracie Harrier, RN Outcome: Progressing   Problem: Clinical Measurements: Goal: Ability to maintain clinical measurements within normal limits will improve 01/13/2020 1008 by Tracie Harrier, RN Outcome: Adequate for Discharge 01/13/2020 0921 by Tracie Harrier, RN Outcome: Progressing Goal: Will remain free from infection 01/13/2020 1008 by Tracie Harrier, RN Outcome: Adequate for Discharge 01/13/2020 0921 by Tracie Harrier, RN Outcome: Progressing Goal: Diagnostic test results will improve 01/13/2020 1008 by Tracie Harrier, RN Outcome: Adequate for Discharge 01/13/2020 0921 by Tracie Harrier, RN Outcome: Progressing Goal: Respiratory complications will improve 01/13/2020 1008 by Tracie Harrier, RN Outcome: Adequate for Discharge 01/13/2020 0921 by Tracie Harrier, RN Outcome: Progressing Goal: Cardiovascular complication will be avoided 01/13/2020 1008 by Tracie Harrier, RN Outcome: Adequate for Discharge 01/13/2020 0921 by Tracie Harrier, RN Outcome: Progressing   Problem: Activity: Goal: Risk for activity intolerance will decrease 01/13/2020 1008 by Tracie Harrier, RN Outcome: Adequate for Discharge 01/13/2020 0921 by Tracie Harrier, RN Outcome: Progressing   Problem: Nutrition: Goal: Adequate nutrition will be maintained 01/13/2020 1008 by Tracie Harrier, RN Outcome: Adequate for Discharge 01/13/2020 0921 by Tracie Harrier, RN Outcome: Progressing    Problem: Coping: Goal: Level of anxiety will decrease 01/13/2020 1008 by Tracie Harrier, RN Outcome: Adequate for Discharge 01/13/2020 0921 by Tracie Harrier, RN Outcome: Progressing   Problem: Elimination: Goal: Will not experience complications related to bowel motility 01/13/2020 1008 by Tracie Harrier, RN Outcome: Adequate for Discharge 01/13/2020 0921 by Tracie Harrier, RN Outcome: Progressing Goal: Will not experience complications related to urinary retention 01/13/2020 1008 by Tracie Harrier, RN Outcome: Adequate for Discharge 01/13/2020 0921 by Tracie Harrier, RN Outcome: Progressing   Problem: Pain Managment: Goal: General experience of comfort will improve 01/13/2020 1008 by Tracie Harrier, RN Outcome: Adequate for Discharge 01/13/2020 0921 by Tracie Harrier, RN Outcome: Progressing   Problem: Safety: Goal: Ability to remain free from injury will improve 01/13/2020 1008 by Tracie Harrier, RN Outcome: Adequate for Discharge 01/13/2020 0921 by Tracie Harrier, RN Outcome: Progressing   Problem: Skin Integrity: Goal: Risk for impaired skin integrity will decrease 01/13/2020 1008 by Tracie Harrier, RN Outcome: Adequate for Discharge 01/13/2020 0921 by Tracie Harrier, RN Outcome: Progressing

## 2020-01-25 ENCOUNTER — Ambulatory Visit: Payer: Self-pay | Attending: Nurse Practitioner | Admitting: Nurse Practitioner

## 2020-01-25 ENCOUNTER — Other Ambulatory Visit: Payer: Self-pay | Admitting: Nurse Practitioner

## 2020-01-25 ENCOUNTER — Other Ambulatory Visit: Payer: Self-pay

## 2020-01-25 ENCOUNTER — Encounter: Payer: Self-pay | Admitting: Nurse Practitioner

## 2020-01-25 VITALS — BP 160/109 | HR 76 | Temp 98.0°F | Ht 76.38 in | Wt 262.0 lb

## 2020-01-25 DIAGNOSIS — Z130 Encounter for screening for diseases of the blood and blood-forming organs and certain disorders involving the immune mechanism: Secondary | ICD-10-CM

## 2020-01-25 DIAGNOSIS — E559 Vitamin D deficiency, unspecified: Secondary | ICD-10-CM

## 2020-01-25 DIAGNOSIS — S270XXD Traumatic pneumothorax, subsequent encounter: Secondary | ICD-10-CM

## 2020-01-25 DIAGNOSIS — I10 Essential (primary) hypertension: Secondary | ICD-10-CM

## 2020-01-25 DIAGNOSIS — S2242XD Multiple fractures of ribs, left side, subsequent encounter for fracture with routine healing: Secondary | ICD-10-CM

## 2020-01-25 DIAGNOSIS — R7303 Prediabetes: Secondary | ICD-10-CM

## 2020-01-25 DIAGNOSIS — M4722 Other spondylosis with radiculopathy, cervical region: Secondary | ICD-10-CM

## 2020-01-25 DIAGNOSIS — Z7689 Persons encountering health services in other specified circumstances: Secondary | ICD-10-CM

## 2020-01-25 DIAGNOSIS — Z1211 Encounter for screening for malignant neoplasm of colon: Secondary | ICD-10-CM

## 2020-01-25 DIAGNOSIS — E785 Hyperlipidemia, unspecified: Secondary | ICD-10-CM

## 2020-01-25 LAB — POCT GLYCOSYLATED HEMOGLOBIN (HGB A1C): Hemoglobin A1C: 5.7 % — AB (ref 4.0–5.6)

## 2020-01-25 LAB — GLUCOSE, POCT (MANUAL RESULT ENTRY): POC Glucose: 102 mg/dl — AB (ref 70–99)

## 2020-01-25 MED ORDER — METOPROLOL SUCCINATE ER 50 MG PO TB24: 50.0000 mg | ORAL_TABLET | Freq: Every day | ORAL | 0 refills | Status: DC

## 2020-01-25 MED ORDER — LIDOCAINE 5 % EX PTCH: 1.0000 | MEDICATED_PATCH | Freq: Every day | CUTANEOUS | 0 refills | Status: DC | PRN

## 2020-01-25 MED ORDER — METHOCARBAMOL 500 MG PO TABS: 1000.0000 mg | ORAL_TABLET | Freq: Four times a day (QID) | ORAL | 0 refills | Status: DC | PRN

## 2020-01-25 MED ORDER — ENALAPRIL MALEATE 10 MG PO TABS: 10.0000 mg | ORAL_TABLET | Freq: Every day | ORAL | 1 refills | Status: DC

## 2020-01-25 MED ORDER — TRAMADOL HCL 50 MG PO TABS: 100.0000 mg | ORAL_TABLET | Freq: Two times a day (BID) | ORAL | 0 refills | Status: DC | PRN

## 2020-01-25 MED FILL — METOPROLOL SUCCINATE ER 50: 50 | 30 days supply | Qty: 30 | Fill #0

## 2020-01-25 MED FILL — ENALAPRIL MALEATE 10 MG TAB: 10 | 30 days supply | Qty: 30 | Fill #0

## 2020-01-25 MED FILL — traMADol HCL 50 MG TABS: 50 | 15 days supply | Qty: 60 | Fill #0

## 2020-01-25 MED FILL — METHOCARBAMOL 500 MG TABS: 500 | 11 days supply | Qty: 90 | Fill #0

## 2020-01-25 MED FILL — LIDOCAINE PATCH 5%: 5 | 30 days supply | Qty: 30 | Fill #0
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
## 2020-01-26 ENCOUNTER — Other Ambulatory Visit: Payer: Self-pay | Admitting: Nurse Practitioner

## 2020-01-26 LAB — CMP14+EGFR
ALT: 16 IU/L (ref 0–44)
AST: 11 IU/L (ref 0–40)
Albumin/Globulin Ratio: 1.8 (ref 1.2–2.2)
Albumin: 4.4 g/dL (ref 3.8–4.9)
Alkaline Phosphatase: 112 IU/L (ref 44–121)
BUN/Creatinine Ratio: 18 (ref 9–20)
BUN: 16 mg/dL (ref 6–24)
Bilirubin Total: 0.2 mg/dL (ref 0.0–1.2)
CO2: 19 mmol/L — ABNORMAL LOW (ref 20–29)
Calcium: 9.6 mg/dL (ref 8.7–10.2)
Chloride: 106 mmol/L (ref 96–106)
Creatinine, Ser: 0.88 mg/dL (ref 0.76–1.27)
GFR calc Af Amer: 115 mL/min/{1.73_m2} (ref >59)
GFR calc non Af Amer: 99 mL/min/{1.73_m2} (ref >59)
Globulin, Total: 2.4 g/dL (ref 1.5–4.5)
Glucose: 92 mg/dL (ref 65–99)
Potassium: 4 mmol/L (ref 3.5–5.2)
Sodium: 139 mmol/L (ref 134–144)
Total Protein: 6.8 g/dL (ref 6.0–8.5)

## 2020-01-26 LAB — CBC
Hematocrit: 51.1 % — ABNORMAL HIGH (ref 37.5–51.0)
Hemoglobin: 17.6 g/dL (ref 13.0–17.7)
MCH: 31.2 pg (ref 26.6–33.0)
MCHC: 34.4 g/dL (ref 31.5–35.7)
MCV: 91 fL (ref 79–97)
Platelets: 293 10*3/uL (ref 150–450)
RBC: 5.64 x10E6/uL (ref 4.14–5.80)
RDW: 12.6 % (ref 11.6–15.4)
WBC: 7.7 10*3/uL (ref 3.4–10.8)

## 2020-01-26 LAB — LIPID PANEL
Chol/HDL Ratio: 6.5 ratio — ABNORMAL HIGH (ref 0.0–5.0)
Cholesterol, Total: 235 mg/dL — ABNORMAL HIGH (ref 100–199)
HDL: 36 mg/dL — ABNORMAL LOW (ref >39)
LDL Chol Calc (NIH): 175 mg/dL — ABNORMAL HIGH (ref 0–99)
Triglycerides: 131 mg/dL (ref 0–149)
VLDL Cholesterol Cal: 24 mg/dL (ref 5–40)

## 2020-01-26 LAB — TSH: TSH: 2.78 u[IU]/mL (ref 0.450–4.500)

## 2020-01-26 LAB — VITAMIN D 25 HYDROXY (VIT D DEFICIENCY, FRACTURES): Vit D, 25-Hydroxy: 18.4 ng/mL — ABNORMAL LOW (ref 30.0–100.0)

## 2020-01-26 MED ORDER — ATORVASTATIN CALCIUM 40 MG PO TABS: 40.0000 mg | ORAL_TABLET | Freq: Every day | ORAL | 3 refills | Status: DC

## 2020-01-26 MED ORDER — VITAMIN D (ERGOCALCIFEROL) 1.25 MG (50000 UNIT) PO CAPS: 50000.0000 [IU] | ORAL_CAPSULE | ORAL | 1 refills | Status: DC

## 2020-02-07 ENCOUNTER — Telehealth: Payer: Self-pay | Admitting: Nurse Practitioner

## 2020-02-07 ENCOUNTER — Other Ambulatory Visit: Payer: Self-pay | Admitting: Nurse Practitioner

## 2020-02-07 DIAGNOSIS — Z0289 Encounter for other administrative examinations: Secondary | ICD-10-CM

## 2020-02-07 DIAGNOSIS — Z79899 Other long term (current) drug therapy: Secondary | ICD-10-CM

## 2020-02-07 DIAGNOSIS — M4722 Other spondylosis with radiculopathy, cervical region: Secondary | ICD-10-CM

## 2020-02-07 DIAGNOSIS — S2242XD Multiple fractures of ribs, left side, subsequent encounter for fracture with routine healing: Secondary | ICD-10-CM

## 2020-02-07 NOTE — Telephone Encounter (Signed)
Pt called in to follow up on refill request. Pt says that he would just like to make sure that his Rx's are ready whenever it's time to fill.

## 2020-02-07 NOTE — Telephone Encounter (Signed)
Requested medication (s) are due for refill today: no  Requested medication (s) are on the active medication list: yes   Last refill:  01/25/2020  Future visit scheduled: yes   Notes to clinic: this refill cannot be delegated    Requested Prescriptions  Pending Prescriptions Disp Refills   traMADol (ULTRAM) 50 MG tablet [Pharmacy Med Name: traMADol HCL 50 MG TABS 50 Tablet] 60 tablet 0    Sig: Take 2 tablets (100 mg total) by mouth every 12 (twelve) hours as needed for severe pain.      Not Delegated - Analgesics:  Opioid Agonists Failed - 02/07/2020 10:58 AM      Failed - This refill cannot be delegated      Failed - Urine Drug Screen completed in last 360 days      Passed - Valid encounter within last 6 months    Recent Outpatient Visits           1 week ago Encounter to establish care   [facility] [place], Shea Stakes, NP       Future Appointments             In 1 week Lois Huxley, Cornelius Moras, RPH-CPP [facility]               methocarbamol (ROBAXIN) 500 MG tablet [Pharmacy Med Name: METHOCARBAMOL 500 MG TABS 500 Tablet] 90 tablet 0    Sig: Take 2 tablets (1,000 mg total) by mouth every 6 (six) hours as needed for muscle spasms.      Not Delegated - Analgesics:  Muscle Relaxants Failed - 02/07/2020 10:58 AM      Failed - This refill cannot be delegated      Passed - Valid encounter within last 6 months    Recent Outpatient Visits           1 week ago Encounter to establish care   [facility] Claiborne Rigg, NP       Future Appointments             In 1 week Lois Huxley, Cornelius Moras, RPH-CPP [facility]

## 2020-02-07 NOTE — Telephone Encounter (Signed)
I do not fill pain medications ahead of time. This was just filled 12-7 Luke please make sure he goes to the lab when you see him. I have blood work he needs to have done. Thanks!

## 2020-02-08 NOTE — Telephone Encounter (Signed)
Will do. I've made a note on his appointment notes.

## 2020-02-10 ENCOUNTER — Other Ambulatory Visit: Payer: Self-pay | Admitting: Nurse Practitioner

## 2020-02-10 DIAGNOSIS — M4722 Other spondylosis with radiculopathy, cervical region: Secondary | ICD-10-CM

## 2020-02-10 DIAGNOSIS — S2242XD Multiple fractures of ribs, left side, subsequent encounter for fracture with routine healing: Secondary | ICD-10-CM

## 2020-02-10 NOTE — Telephone Encounter (Signed)
Copied from CRM [id number]. Topic: General - Other >> Feb 10, 2020 11:44 AM Jaquita Rector A wrote: Reason for CRM: Patient called in to inquire of Isaac Goodwin about the refill that he need traMADol (ULTRAM) 50 MG tablet and methocarbamol (ROBAXIN) 500 MG tablet asking if Rx can be sent to the pharmacy today and give him a call back when Rx is sent Ph# [phone]  I saw where Shon Hale said this medication was filled 12/7 and she will not re-full until due. Can you reach out to patient and let him know or should I?
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
## 2020-02-10 NOTE — Telephone Encounter (Signed)
Requested medication (s) are due for refill today: No  Requested medication (s) are on the active medication list: Yes  Last refill:  01/25/20  Future visit scheduled: No  Notes to clinic:  See request.    Requested Prescriptions  Pending Prescriptions Disp Refills   traMADol (ULTRAM) 50 MG tablet [Pharmacy Med Name: traMADol HCL 50 MG TABS 50 Tablet] 60 tablet 0    Sig: Take 2 tablets (100 mg total) by mouth every 12 (twelve) hours as needed for severe pain.      Not Delegated - Analgesics:  Opioid Agonists Failed - 02/10/2020 11:36 AM      Failed - This refill cannot be delegated      Failed - Urine Drug Screen completed in last 360 days      Passed - Valid encounter within last 6 months    Recent Outpatient Visits           2 weeks ago Encounter to establish care   [facility] [place], Shea Stakes, NP       Future Appointments             In 5 days Lois Huxley, Cornelius Moras, RPH-CPP [place] [facility]               methocarbamol (ROBAXIN) 500 MG tablet [Pharmacy Med Name: METHOCARBAMOL 500 MG TABS 500 Tablet] 90 tablet 0    Sig: Take 2 tablets (1,000 mg total) by mouth every 6 (six) hours as needed for muscle spasms.      Not Delegated - Analgesics:  Muscle Relaxants Failed - 02/10/2020 11:36 AM      Failed - This refill cannot be delegated      Passed - Valid encounter within last 6 months    Recent Outpatient Visits           2 weeks ago Encounter to establish care   [facility] Claiborne Rigg, NP       Future Appointments             In 5 days Lois Huxley, Cornelius Moras, RPH-CPP [facility]

## 2020-02-12 ENCOUNTER — Other Ambulatory Visit: Payer: Self-pay | Admitting: Nurse Practitioner

## 2020-02-12 MED ORDER — IBUPROFEN 600 MG PO TABS
600.0000 mg | ORAL_TABLET | Freq: Three times a day (TID) | ORAL | 0 refills | Status: DC | PRN
Start: 2020-02-12 — End: 2020-03-01

## 2020-02-12 NOTE — Telephone Encounter (Signed)
Will not fill early until 02-25-2020. He can take ibuprofen if he would like in between. That has been sent to the pharmacy .

## 2020-02-15 ENCOUNTER — Ambulatory Visit: Payer: Self-pay | Admitting: Pharmacist

## 2020-02-15 MED FILL — METHOCARBAMOL 500 MG TABS: 500 | 11 days supply | Qty: 90 | Fill #0
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
## 2020-02-16 ENCOUNTER — Other Ambulatory Visit: Payer: Self-pay

## 2020-02-16 ENCOUNTER — Encounter: Payer: Self-pay | Admitting: Pharmacist

## 2020-02-16 ENCOUNTER — Ambulatory Visit: Payer: No Typology Code available for payment source | Attending: Nurse Practitioner | Admitting: Pharmacist

## 2020-02-16 VITALS — BP 126/87 | HR 77

## 2020-02-16 DIAGNOSIS — I10 Essential (primary) hypertension: Secondary | ICD-10-CM

## 2020-02-17 ENCOUNTER — Encounter: Payer: Self-pay | Admitting: Pharmacist

## 2020-02-18 ENCOUNTER — Other Ambulatory Visit: Payer: Self-pay | Admitting: Nurse Practitioner

## 2020-02-24 ENCOUNTER — Other Ambulatory Visit: Payer: Self-pay | Admitting: Nurse Practitioner

## 2020-02-24 MED ORDER — ATORVASTATIN CALCIUM 40 MG PO TABS: 40.0000 mg | ORAL_TABLET | Freq: Every day | ORAL | 3 refills | Status: DC

## 2020-02-24 MED FILL — ATORVASTATIN CALCIUM 40 MG: 40 | 30 days supply | Qty: 30 | Fill #0

## 2020-02-24 NOTE — Telephone Encounter (Signed)
Medication: atorvastatin (LIPITOR) 40 MG tablet [[id number]] , Vitamin D, Ergocalciferol, (DRISDOL) 1.25 MG (50000 UNIT) CAPS capsule [[id number]]   The above medication was sent to the wrong pharmacy. Can it be sent to the below pharmacy please?  Has the patient contacted their pharmacy? YES  (Agent: If no, request that the patient contact the pharmacy for the refill.) (Agent: If yes, when and what did the pharmacy advise?)  Preferred Pharmacy (with phone number or street name): [facility] - [place], [place] - [place] [address] [place] [address] Phone: [phone] Fax: [phone] Hours: Not open 24 hours    Agent: Please be advised that RX refills may take up to 3 business days. We ask that you follow-up with your pharmacy.

## 2020-02-24 NOTE — Telephone Encounter (Signed)
Notes to clinic:  Patient would like medication sent to [facility]   Requested Prescriptions  Pending Prescriptions Disp Refills   Vitamin D, Ergocalciferol, (DRISDOL) 1.25 MG (50000 UNIT) CAPS capsule 12 capsule 1    Sig: Take 1 capsule (50,000 Units total) by mouth every 7 (seven) days.      Endocrinology:  Vitamins - Vitamin D Supplementation Failed - 02/24/2020  3:12 PM      Failed - 50,000 IU strengths are not delegated      Failed - Phosphate in normal range and within 360 days    No results found for: PHOS        Failed - Vitamin D in normal range and within 360 days    Vit D, 25-Hydroxy  Date Value Ref Range Status  01/25/2020 18.4 (L) 30.0 - 100.0 ng/mL Final    Comment:    Vitamin D deficiency has been defined by the Institute of Medicine and an Endocrine Society practice guideline as a level of serum 25-OH vitamin D less than 20 ng/mL (1,2). The Endocrine Society went on to further define vitamin D insufficiency as a level between 21 and 29 ng/mL (2). 1. IOM (Institute of Medicine). 2010. Dietary reference    intakes for calcium and D. [place]: The    [employer]. 2. Holick MF, Binkley Welcome, Bischoff-Ferrari HA, et al.    Evaluation, treatment, and prevention of vitamin D    deficiency: an Endocrine Society clinical practice    guideline. JCEM. 2011 Jul; 96(7):1911-30.           Passed - Ca in normal range and within 360 days    Calcium  Date Value Ref Range Status  01/25/2020 9.6 8.7 - 10.2 mg/dL Final   Calcium, Ion  Date Value Ref Range Status  01/11/2020 1.24 1.15 - 1.40 mmol/L Final          Passed - Valid encounter within last 12 months    Recent Outpatient Visits           1 week ago Essential hypertension   [facility] Lois Huxley, Cornelius Moras, RPH-CPP   1 month ago Encounter to establish care   [facility] [place], Shea Stakes, NP       Future Appointments             In  3 weeks Drucilla Chalet, RPH-CPP [place] [facility]              Signed Prescriptions Disp Refills   atorvastatin (LIPITOR) 40 MG tablet 90 tablet 3    Sig: Take 1 tablet (40 mg total) by mouth daily.      Cardiovascular:  Antilipid - Statins Failed - 02/24/2020  3:12 PM      Failed - Total Cholesterol in normal range and within 360 days    Cholesterol, Total  Date Value Ref Range Status  01/25/2020 235 (H) 100 - 199 mg/dL Final          Failed - LDL in normal range and within 360 days    LDL Chol Calc (NIH)  Date Value Ref Range Status  01/25/2020 175 (H) 0 - 99 mg/dL Final          Failed - HDL in normal range and within 360 days    HDL  Date Value Ref Range Status  01/25/2020 36 (L) >39 mg/dL Final  Passed - Triglycerides in normal range and within 360 days    Triglycerides  Date Value Ref Range Status  01/25/2020 131 0 - 149 mg/dL Final          Passed - Patient is not pregnant      Passed - Valid encounter within last 12 months    Recent Outpatient Visits           1 week ago Essential hypertension   [place] [facility] Lois Huxley, Cornelius Moras, RPH-CPP   1 month ago Encounter to establish care   [facility] Claiborne Rigg, NP       Future Appointments             In 3 weeks Drucilla Chalet, RPH-CPP [facility]

## 2020-02-25 ENCOUNTER — Other Ambulatory Visit: Payer: Self-pay | Admitting: Family Medicine

## 2020-02-25 MED ORDER — VITAMIN D (ERGOCALCIFEROL) 1.25 MG (50000 UNIT) PO CAPS: 50000.0000 [IU] | ORAL_CAPSULE | ORAL | 1 refills | Status: DC

## 2020-02-25 MED FILL — VIT D2 1.25 MG (50,000 UNIT: 1.25 MG | 84 days supply | Qty: 12 | Fill #0

## 2020-02-28 ENCOUNTER — Other Ambulatory Visit: Payer: Self-pay

## 2020-02-28 ENCOUNTER — Encounter (HOSPITAL_COMMUNITY): Payer: Self-pay | Admitting: Interventional Cardiology

## 2020-02-28 ENCOUNTER — Inpatient Hospital Stay (HOSPITAL_COMMUNITY)
Admission: RE | Disposition: A | Discharge status: Home / Self Care | Payer: Self-pay | Source: Home / Self Care | Attending: Interventional Cardiology

## 2020-02-28 ENCOUNTER — Inpatient Hospital Stay (HOSPITAL_COMMUNITY)
Admission: RE | Admit: 2020-02-28 | Discharge: 2020-03-01 | DRG: 246 | Disposition: A | Discharge status: Home / Self Care | Payer: Medicaid Other | Attending: Interventional Cardiology | Admitting: Interventional Cardiology

## 2020-02-28 DIAGNOSIS — Z7982 Long term (current) use of aspirin: Secondary | ICD-10-CM | POA: Diagnosis not present

## 2020-02-28 DIAGNOSIS — I2510 Atherosclerotic heart disease of native coronary artery without angina pectoris: Secondary | ICD-10-CM

## 2020-02-28 DIAGNOSIS — E785 Hyperlipidemia, unspecified: Secondary | ICD-10-CM | POA: Diagnosis present

## 2020-02-28 DIAGNOSIS — E7800 Pure hypercholesterolemia, unspecified: Secondary | ICD-10-CM | POA: Diagnosis present

## 2020-02-28 DIAGNOSIS — I252 Old myocardial infarction: Secondary | ICD-10-CM | POA: Diagnosis not present

## 2020-02-28 DIAGNOSIS — Z79899 Other long term (current) drug therapy: Secondary | ICD-10-CM | POA: Diagnosis not present

## 2020-02-28 DIAGNOSIS — I2119 ST elevation (STEMI) myocardial infarction involving other coronary artery of inferior wall: Secondary | ICD-10-CM | POA: Diagnosis present

## 2020-02-28 DIAGNOSIS — I2121 ST elevation (STEMI) myocardial infarction involving left circumflex coronary artery: Secondary | ICD-10-CM

## 2020-02-28 DIAGNOSIS — Z880 Allergy status to penicillin: Secondary | ICD-10-CM

## 2020-02-28 DIAGNOSIS — Z955 Presence of coronary angioplasty implant and graft: Secondary | ICD-10-CM

## 2020-02-28 DIAGNOSIS — Z808 Family history of malignant neoplasm of other organs or systems: Secondary | ICD-10-CM

## 2020-02-28 DIAGNOSIS — Z20822 Contact with and (suspected) exposure to covid-19: Secondary | ICD-10-CM | POA: Diagnosis present

## 2020-02-28 DIAGNOSIS — I10 Essential (primary) hypertension: Secondary | ICD-10-CM | POA: Diagnosis present

## 2020-02-28 DIAGNOSIS — I110 Hypertensive heart disease with heart failure: Secondary | ICD-10-CM | POA: Diagnosis present

## 2020-02-28 DIAGNOSIS — Z833 Family history of diabetes mellitus: Secondary | ICD-10-CM

## 2020-02-28 DIAGNOSIS — R7303 Prediabetes: Secondary | ICD-10-CM

## 2020-02-28 DIAGNOSIS — Z888 Allergy status to other drugs, medicaments and biological substances status: Secondary | ICD-10-CM

## 2020-02-28 DIAGNOSIS — F17210 Nicotine dependence, cigarettes, uncomplicated: Secondary | ICD-10-CM | POA: Diagnosis present

## 2020-02-28 DIAGNOSIS — E119 Type 2 diabetes mellitus without complications: Secondary | ICD-10-CM | POA: Diagnosis present

## 2020-02-28 DIAGNOSIS — I5021 Acute systolic (congestive) heart failure: Secondary | ICD-10-CM

## 2020-02-28 DIAGNOSIS — G4733 Obstructive sleep apnea (adult) (pediatric): Secondary | ICD-10-CM | POA: Diagnosis present

## 2020-02-28 DIAGNOSIS — I214 Non-ST elevation (NSTEMI) myocardial infarction: Secondary | ICD-10-CM

## 2020-02-28 DIAGNOSIS — R079 Chest pain, unspecified: Secondary | ICD-10-CM | POA: Diagnosis present

## 2020-02-28 HISTORY — PX: CORONARY/GRAFT ACUTE MI REVASCULARIZATION: CATH118305

## 2020-02-28 HISTORY — PX: LEFT HEART CATH AND CORONARY ANGIOGRAPHY: CATH118249

## 2020-02-28 LAB — COMPREHENSIVE METABOLIC PANEL
ALT: 25 U/L (ref 0–44)
AST: 29 U/L (ref 15–41)
Albumin: 3.7 g/dL (ref 3.5–5.0)
Alkaline Phosphatase: 68 U/L (ref 38–126)
Anion gap: 11 (ref 5–15)
BUN: 13 mg/dL (ref 6–20)
CO2: 19 mmol/L — ABNORMAL LOW (ref 22–32)
Calcium: 8.5 mg/dL — ABNORMAL LOW (ref 8.9–10.3)
Chloride: 102 mmol/L (ref 98–111)
Creatinine, Ser: 0.94 mg/dL (ref 0.61–1.24)
GFR, Estimated: >60 mL/min (ref >60)
Glucose, Bld: 145 mg/dL — ABNORMAL HIGH (ref 70–99)
Potassium: 3.8 mmol/L (ref 3.5–5.1)
Sodium: 132 mmol/L — ABNORMAL LOW (ref 135–145)
Total Bilirubin: 0.7 mg/dL (ref 0.3–1.2)
Total Protein: 6.2 g/dL — ABNORMAL LOW (ref 6.5–8.1)

## 2020-02-28 LAB — CBC
HCT: 49.3 % (ref 39.0–52.0)
Hemoglobin: 16.8 g/dL (ref 13.0–17.0)
MCH: 30.5 pg (ref 26.0–34.0)
MCHC: 34.1 g/dL (ref 30.0–36.0)
MCV: 89.5 fL (ref 80.0–100.0)
Platelets: 243 10*3/uL (ref 150–400)
RBC: 5.51 MIL/uL (ref 4.22–5.81)
RDW: 12.7 % (ref 11.5–15.5)
WBC: 15.4 10*3/uL — ABNORMAL HIGH (ref 4.0–10.5)
nRBC: 0 % (ref 0.0–0.2)

## 2020-02-28 LAB — POCT I-STAT, CHEM 8
BUN: 13 mg/dL (ref 6–20)
Calcium, Ion: 1.19 mmol/L (ref 1.15–1.40)
Chloride: 107 mmol/L (ref 98–111)
Creatinine, Ser: 0.8 mg/dL (ref 0.61–1.24)
Glucose, Bld: 151 mg/dL — ABNORMAL HIGH (ref 70–99)
HCT: 49 % (ref 39.0–52.0)
Hemoglobin: 16.7 g/dL (ref 13.0–17.0)
Potassium: 3.6 mmol/L (ref 3.5–5.1)
Sodium: 141 mmol/L (ref 135–145)
TCO2: 22 mmol/L (ref 22–32)

## 2020-02-28 LAB — LIPID PANEL
Cholesterol: 256 mg/dL — ABNORMAL HIGH (ref 0–200)
HDL: 34 mg/dL — ABNORMAL LOW (ref >40)
LDL Cholesterol: 209 mg/dL — ABNORMAL HIGH (ref 0–99)
Total CHOL/HDL Ratio: 7.5 RATIO
Triglycerides: 64 mg/dL (ref <150)
VLDL: 13 mg/dL (ref 0–40)

## 2020-02-28 LAB — PROTIME-INR
INR: 1.3 — ABNORMAL HIGH (ref 0.8–1.2)
Prothrombin Time: 15.2 seconds (ref 11.4–15.2)

## 2020-02-28 LAB — POCT ACTIVATED CLOTTING TIME
Activated Clotting Time: 273 seconds
Activated Clotting Time: 654 seconds

## 2020-02-28 LAB — RESP PANEL BY RT-PCR (FLU A&B, COVID) ARPGX2
Influenza A by PCR: NEGATIVE
Influenza B by PCR: NEGATIVE
SARS Coronavirus 2 by RT PCR: NEGATIVE

## 2020-02-28 LAB — HEMOGLOBIN A1C
Hgb A1c MFr Bld: 5.7 % — ABNORMAL HIGH (ref 4.8–5.6)
Mean Plasma Glucose: 116.89 mg/dL

## 2020-02-28 LAB — APTT: aPTT: >200 seconds (ref 24–36)

## 2020-02-28 LAB — TROPONIN I (HIGH SENSITIVITY): Troponin I (High Sensitivity): 2542 ng/L (ref <18)

## 2020-02-28 SURGERY — LEFT HEART CATH AND CORONARY ANGIOGRAPHY
Anesthesia: LOCAL

## 2020-02-28 MED ORDER — TIROFIBAN (AGGRASTAT) 50MCG/ML FOR INTRACORONARY USE
INTRACORONARY | Status: DC | PRN
  Administered 2020-02-28 (×2): 5 mL via INTRACORONARY

## 2020-02-28 MED ORDER — HYDROMORPHONE HCL 1 MG/ML IJ SOLN
INTRAMUSCULAR | Status: AC
  Filled 2020-02-28: qty 0.5

## 2020-02-28 MED ORDER — HEPARIN (PORCINE) IN NACL 1000-0.9 UT/500ML-% IV SOLN
INTRAVENOUS | Status: AC
  Filled 2020-02-28: qty 1000

## 2020-02-28 MED ORDER — VERAPAMIL HCL 2.5 MG/ML IV SOLN
INTRAVENOUS | Status: AC
  Filled 2020-02-28: qty 2

## 2020-02-28 MED ORDER — BUPROPION HCL ER (SR) 150 MG PO TB12
150.0000 mg | ORAL_TABLET | Freq: Every day | ORAL | Status: DC
  Filled 2020-02-28: qty 1

## 2020-02-28 MED ORDER — SODIUM CHLORIDE 0.9 % IV SOLN
INTRAVENOUS | Status: AC | PRN
  Administered 2020-02-28: 100 mL/h via INTRAVENOUS

## 2020-02-28 MED ORDER — NITROGLYCERIN IN D5W 200-5 MCG/ML-% IV SOLN
INTRAVENOUS | Status: AC | PRN
  Administered 2020-02-28: 20 ug/min via INTRAVENOUS

## 2020-02-28 MED ORDER — ASPIRIN 81 MG PO CHEW
81.0000 mg | CHEWABLE_TABLET | Freq: Every day | ORAL | Status: DC
  Administered 2020-02-28 – 2020-03-01 (×3): 81 mg via ORAL
  Filled 2020-02-28 (×3): qty 1

## 2020-02-28 MED ORDER — LABETALOL HCL 5 MG/ML IV SOLN: 10.0000 mg | INTRAVENOUS | Status: AC | PRN

## 2020-02-28 MED ORDER — ACETAMINOPHEN 500 MG PO TABS: 1000.0000 mg | ORAL_TABLET | Freq: Three times a day (TID) | ORAL | Status: DC | PRN

## 2020-02-28 MED ORDER — TIROFIBAN HCL IN NACL 5-0.9 MG/100ML-% IV SOLN
INTRAVENOUS | Status: AC
  Filled 2020-02-28: qty 100

## 2020-02-28 MED ORDER — HEPARIN (PORCINE) IN NACL 1000-0.9 UT/500ML-% IV SOLN
INTRAVENOUS | Status: DC | PRN
  Administered 2020-02-28 (×3): 500 mL

## 2020-02-28 MED ORDER — ACETAMINOPHEN 325 MG PO TABS: 650.0000 mg | ORAL_TABLET | ORAL | Status: DC | PRN

## 2020-02-28 MED ORDER — LIDOCAINE HCL (PF) 1 % IJ SOLN
INTRAMUSCULAR | Status: DC | PRN
  Administered 2020-02-28: 2 mL via SUBCUTANEOUS

## 2020-02-28 MED ORDER — AMIODARONE LOAD VIA INFUSION
INTRAVENOUS | Status: DC | PRN
  Administered 2020-02-28: 150 mg via INTRAVENOUS

## 2020-02-28 MED ORDER — FENTANYL CITRATE (PF) 100 MCG/2ML IJ SOLN
INTRAMUSCULAR | Status: AC
  Filled 2020-02-28: qty 2

## 2020-02-28 MED ORDER — MIDAZOLAM HCL 2 MG/2ML IJ SOLN
INTRAMUSCULAR | Status: DC | PRN
  Administered 2020-02-28 (×2): 1 mg via INTRAVENOUS

## 2020-02-28 MED ORDER — NITROGLYCERIN 1 MG/10 ML FOR IR/CATH LAB
INTRA_ARTERIAL | Status: AC
  Filled 2020-02-28: qty 10

## 2020-02-28 MED ORDER — AMIODARONE HCL 150 MG/3ML IV SOLN
INTRAVENOUS | Status: AC
  Filled 2020-02-28: qty 3

## 2020-02-28 MED ORDER — HEPARIN SODIUM (PORCINE) 1000 UNIT/ML IJ SOLN
INTRAMUSCULAR | Status: AC
  Filled 2020-02-28: qty 1

## 2020-02-28 MED ORDER — NITROGLYCERIN IN D5W 200-5 MCG/ML-% IV SOLN
INTRAVENOUS | Status: AC
  Filled 2020-02-28: qty 250

## 2020-02-28 MED ORDER — METOPROLOL SUCCINATE ER 50 MG PO TB24
50.0000 mg | ORAL_TABLET | Freq: Every day | ORAL | Status: DC
Start: 2020-02-28 — End: 2020-03-01
  Administered 2020-02-28 – 2020-03-01 (×3): 50 mg via ORAL
  Filled 2020-02-28 (×3): qty 1

## 2020-02-28 MED ORDER — FUROSEMIDE 10 MG/ML IJ SOLN
40.0000 mg | Freq: Once | INTRAMUSCULAR | Status: AC
  Administered 2020-02-28: 40 mg via INTRAVENOUS
  Filled 2020-02-28: qty 4

## 2020-02-28 MED ORDER — TIROFIBAN HCL IN NACL 5-0.9 MG/100ML-% IV SOLN
INTRAVENOUS | Status: AC | PRN
  Administered 2020-02-28 (×2): 0.15 ug/kg/min via INTRAVENOUS

## 2020-02-28 MED ORDER — ONDANSETRON HCL 4 MG/2ML IJ SOLN
4.0000 mg | Freq: Four times a day (QID) | INTRAMUSCULAR | Status: DC | PRN
  Filled 2020-02-28: qty 2

## 2020-02-28 MED ORDER — MIDAZOLAM HCL 2 MG/2ML IJ SOLN
INTRAMUSCULAR | Status: AC
  Filled 2020-02-28: qty 2

## 2020-02-28 MED ORDER — HYDRALAZINE HCL 20 MG/ML IJ SOLN
10.0000 mg | INTRAMUSCULAR | Status: AC | PRN
  Filled 2020-02-28: qty 1

## 2020-02-28 MED ORDER — SODIUM CHLORIDE 0.9% FLUSH
3.0000 mL | Freq: Two times a day (BID) | INTRAVENOUS | Status: DC
  Administered 2020-02-29 – 2020-03-01 (×2): 3 mL via INTRAVENOUS

## 2020-02-28 MED ORDER — POTASSIUM CHLORIDE CRYS ER 20 MEQ PO TBCR
40.0000 meq | EXTENDED_RELEASE_TABLET | Freq: Once | ORAL | Status: AC
  Administered 2020-02-28: 40 meq via ORAL
  Filled 2020-02-28: qty 2

## 2020-02-28 MED ORDER — CHLORHEXIDINE GLUCONATE CLOTH 2 % EX PADS: 6.0000 | MEDICATED_PAD | Freq: Every day | CUTANEOUS | Status: DC

## 2020-02-28 MED ORDER — TICAGRELOR 90 MG PO TABS
ORAL_TABLET | ORAL | Status: DC | PRN
  Administered 2020-02-28: 180 mg via ORAL

## 2020-02-28 MED ORDER — HEPARIN SODIUM (PORCINE) 1000 UNIT/ML IJ SOLN
INTRAMUSCULAR | Status: DC | PRN
  Administered 2020-02-28: 3000 [IU] via INTRAVENOUS

## 2020-02-28 MED ORDER — SODIUM CHLORIDE 0.9 % IV SOLN: 250.0000 mL | INTRAVENOUS | Status: DC | PRN

## 2020-02-28 MED ORDER — ASPIRIN 81 MG PO TABS: 81.0000 mg | ORAL_TABLET | ORAL | Status: DC

## 2020-02-28 MED ORDER — LIDOCAINE HCL (PF) 1 % IJ SOLN
INTRAMUSCULAR | Status: AC
  Filled 2020-02-28: qty 30

## 2020-02-28 MED ORDER — METHOCARBAMOL 500 MG PO TABS
1000.0000 mg | ORAL_TABLET | Freq: Four times a day (QID) | ORAL | Status: DC | PRN
  Administered 2020-02-28: 1000 mg via ORAL
  Filled 2020-02-28 (×2): qty 2

## 2020-02-28 MED ORDER — BUPROPION HCL ER (XL) 150 MG PO TB24
150.0000 mg | ORAL_TABLET | Freq: Every day | ORAL | Status: DC
  Administered 2020-02-28 – 2020-03-01 (×3): 150 mg via ORAL
  Filled 2020-02-28 (×3): qty 1

## 2020-02-28 MED ORDER — VERAPAMIL HCL 2.5 MG/ML IV SOLN
INTRAVENOUS | Status: DC | PRN
  Administered 2020-02-28: 10 mL via INTRA_ARTERIAL

## 2020-02-28 MED ORDER — FENTANYL CITRATE (PF) 100 MCG/2ML IJ SOLN
INTRAMUSCULAR | Status: DC | PRN
  Administered 2020-02-28: 25 ug via INTRAVENOUS
  Administered 2020-02-28: 50 ug via INTRAVENOUS
  Administered 2020-02-28: 25 ug via INTRAVENOUS

## 2020-02-28 MED ORDER — IOHEXOL 350 MG/ML SOLN
INTRAVENOUS | Status: DC | PRN
  Administered 2020-02-28: 255 mL via INTRA_ARTERIAL

## 2020-02-28 MED ORDER — NITROGLYCERIN IN D5W 200-5 MCG/ML-% IV SOLN: 0.0000 ug/min | INTRAVENOUS | Status: DC

## 2020-02-28 MED ORDER — TIROFIBAN HCL IN NACL 5-0.9 MG/100ML-% IV SOLN
0.1500 ug/kg/min | INTRAVENOUS | Status: AC
  Administered 2020-02-28 – 2020-02-29 (×2): 0.15 ug/kg/min via INTRAVENOUS
  Filled 2020-02-28 (×3): qty 100

## 2020-02-28 MED ORDER — SODIUM CHLORIDE 0.9% FLUSH: 3.0000 mL | INTRAVENOUS | Status: DC | PRN

## 2020-02-28 MED ORDER — TICAGRELOR 90 MG PO TABS
ORAL_TABLET | ORAL | Status: AC
  Filled 2020-02-28: qty 2

## 2020-02-28 MED ORDER — HYDROMORPHONE HCL 1 MG/ML IJ SOLN
0.5000 mg | INTRAMUSCULAR | Status: DC | PRN
  Administered 2020-02-28 – 2020-02-29 (×4): 1 mg via INTRAVENOUS
  Administered 2020-02-29: 0.5 mg via INTRAVENOUS
  Filled 2020-02-28 (×5): qty 1

## 2020-02-28 MED ORDER — TICAGRELOR 90 MG PO TABS
90.0000 mg | ORAL_TABLET | Freq: Two times a day (BID) | ORAL | Status: DC
  Administered 2020-02-29 – 2020-03-01 (×3): 90 mg via ORAL
  Filled 2020-02-28 (×4): qty 1

## 2020-02-28 MED ORDER — HYDROMORPHONE HCL 1 MG/ML IJ SOLN
INTRAMUSCULAR | Status: DC | PRN
  Administered 2020-02-28 (×2): 0.5 mg via INTRAVENOUS

## 2020-02-28 MED ORDER — TIROFIBAN (AGGRASTAT) BOLUS VIA INFUSION
INTRAVENOUS | Status: DC | PRN
  Administered 2020-02-28: 2950 ug via INTRAVENOUS

## 2020-02-28 MED ORDER — LIDOCAINE 5 % EX PTCH
1.0000 | MEDICATED_PATCH | Freq: Every day | CUTANEOUS | Status: DC | PRN
  Filled 2020-02-28 (×2): qty 1

## 2020-02-28 MED ORDER — ATORVASTATIN CALCIUM 80 MG PO TABS
80.0000 mg | ORAL_TABLET | Freq: Every day | ORAL | Status: DC
  Administered 2020-02-28 – 2020-03-01 (×3): 80 mg via ORAL
  Filled 2020-02-28 (×3): qty 1

## 2020-02-28 MED ORDER — VERAPAMIL HCL 2.5 MG/ML IV SOLN
INTRAVENOUS | Status: DC | PRN
  Administered 2020-02-28 (×5): 200 ug via INTRACORONARY

## 2020-02-28 SURGICAL SUPPLY — 21 items
BALLN SAPPHIRE 2.0X15 (BALLOONS) ×2
BALLN SAPPHIRE 2.5X20 (BALLOONS) ×2
BALLN SAPPHIRE ~~LOC~~ 3.75X12 (BALLOONS) ×2 IMPLANT
BALLOON SAPPHIRE 2.0X15 (BALLOONS) ×1 IMPLANT
BALLOON SAPPHIRE 2.5X20 (BALLOONS) ×1 IMPLANT
CATH 5FR JL3.5 JR4 ANG PIG MP (CATHETERS) ×2 IMPLANT
CATH EXTRAC PRONTO 5.5F 138CM (CATHETERS) ×2 IMPLANT
CATH LAUNCHER 6FR EBU3.5 (CATHETERS) ×2 IMPLANT
CATH LAUNCHER 6FR JR4 (CATHETERS) ×2 IMPLANT
DEVICE RAD COMP TR BAND LRG (VASCULAR PRODUCTS) ×2 IMPLANT
GLIDESHEATH SLEND SS 6F .021 (SHEATH) ×2 IMPLANT
KIT ENCORE 26 ADVANTAGE (KITS) ×2 IMPLANT
KIT HEART LEFT (KITS) ×2 IMPLANT
KIT HEMO VALVE WATCHDOG (MISCELLANEOUS) ×2 IMPLANT
PACK CARDIAC CATHETERIZATION (CUSTOM PROCEDURE TRAY) ×2 IMPLANT
STENT RESOLUTE ONYX 2.0X18 (Permanent Stent) ×2 IMPLANT
STENT RESOLUTE ONYX 3.5X18 (Permanent Stent) ×2 IMPLANT
TRANSDUCER W/STOPCOCK (MISCELLANEOUS) ×2 IMPLANT
TUBING CIL FLEX 10 FLL-RA (TUBING) ×2 IMPLANT
WIRE ASAHI PROWATER 180CM (WIRE) ×2 IMPLANT
WIRE HI TORQ BMW 190CM (WIRE) ×2 IMPLANT

## 2020-02-28 NOTE — Plan of Care (Signed)
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
## 2020-02-29 ENCOUNTER — Inpatient Hospital Stay (HOSPITAL_COMMUNITY): Payer: Medicaid Other

## 2020-02-29 ENCOUNTER — Encounter (HOSPITAL_COMMUNITY): Payer: Self-pay | Admitting: Interventional Cardiology

## 2020-02-29 DIAGNOSIS — I10 Essential (primary) hypertension: Secondary | ICD-10-CM

## 2020-02-29 DIAGNOSIS — I25118 Atherosclerotic heart disease of native coronary artery with other forms of angina pectoris: Secondary | ICD-10-CM

## 2020-02-29 DIAGNOSIS — E782 Mixed hyperlipidemia: Secondary | ICD-10-CM

## 2020-02-29 DIAGNOSIS — I2119 ST elevation (STEMI) myocardial infarction involving other coronary artery of inferior wall: Secondary | ICD-10-CM

## 2020-02-29 DIAGNOSIS — I519 Heart disease, unspecified: Secondary | ICD-10-CM

## 2020-02-29 LAB — BASIC METABOLIC PANEL
Anion gap: 12 (ref 5–15)
BUN: 12 mg/dL (ref 6–20)
CO2: 21 mmol/L — ABNORMAL LOW (ref 22–32)
Calcium: 9.2 mg/dL (ref 8.9–10.3)
Chloride: 104 mmol/L (ref 98–111)
Creatinine, Ser: 0.99 mg/dL (ref 0.61–1.24)
GFR, Estimated: >60 mL/min (ref >60)
Glucose, Bld: 171 mg/dL — ABNORMAL HIGH (ref 70–99)
Potassium: 4 mmol/L (ref 3.5–5.1)
Sodium: 137 mmol/L (ref 135–145)

## 2020-02-29 LAB — ECHOCARDIOGRAM COMPLETE
Area-P 1/2: 3.77 cm2
Calc EF: 51 %
Height: 76 in
S' Lateral: 3.2 cm
Single Plane A2C EF: 49.5 %
Single Plane A4C EF: 50.4 %
Weight: 4194.03 oz

## 2020-02-29 LAB — CBC
HCT: 47.6 % (ref 39.0–52.0)
Hemoglobin: 17.4 g/dL — ABNORMAL HIGH (ref 13.0–17.0)
MCH: 32.2 pg (ref 26.0–34.0)
MCHC: 36.6 g/dL — ABNORMAL HIGH (ref 30.0–36.0)
MCV: 88 fL (ref 80.0–100.0)
Platelets: 239 10*3/uL (ref 150–400)
RBC: 5.41 MIL/uL (ref 4.22–5.81)
RDW: 12.9 % (ref 11.5–15.5)
WBC: 14.8 10*3/uL — ABNORMAL HIGH (ref 4.0–10.5)
nRBC: 0 % (ref 0.0–0.2)

## 2020-02-29 LAB — MRSA PCR SCREENING: MRSA by PCR: NEGATIVE

## 2020-02-29 MED ORDER — FUROSEMIDE 10 MG/ML IJ SOLN
40.0000 mg | Freq: Once | INTRAMUSCULAR | Status: AC
  Administered 2020-02-29: 40 mg via INTRAVENOUS
  Filled 2020-02-29: qty 4

## 2020-02-29 MED ORDER — VITAMIN D 25 MCG (1000 UNIT) PO TABS
1000.0000 [IU] | ORAL_TABLET | Freq: Every day | ORAL | Status: DC
  Administered 2020-02-29 – 2020-03-01 (×2): 1000 [IU] via ORAL
  Filled 2020-02-29 (×2): qty 1

## 2020-02-29 MED ORDER — HYDROMORPHONE HCL 1 MG/ML IJ SOLN
0.5000 mg | INTRAMUSCULAR | Status: DC | PRN
  Administered 2020-02-29: 0.5 mg via INTRAVENOUS
  Filled 2020-02-29: qty 1

## 2020-02-29 MED ORDER — TRAMADOL HCL 50 MG PO TABS
100.0000 mg | ORAL_TABLET | Freq: Four times a day (QID) | ORAL | Status: DC | PRN
  Administered 2020-02-29 (×2): 100 mg via ORAL
  Filled 2020-02-29 (×2): qty 2

## 2020-02-29 MED ORDER — POTASSIUM CHLORIDE CRYS ER 20 MEQ PO TBCR
40.0000 meq | EXTENDED_RELEASE_TABLET | Freq: Once | ORAL | Status: AC
  Administered 2020-02-29: 40 meq via ORAL
  Filled 2020-02-29: qty 2

## 2020-02-29 MED ORDER — ENALAPRIL MALEATE 5 MG PO TABS
10.0000 mg | ORAL_TABLET | Freq: Every day | ORAL | Status: DC
  Administered 2020-02-29 – 2020-03-01 (×2): 10 mg via ORAL
  Filled 2020-02-29 (×2): qty 2

## 2020-02-29 MED FILL — Heparin Sodium (Porcine) Inj 1000 Unit/ML: INTRAMUSCULAR | Qty: 10 | Status: AC

## 2020-02-29 MED FILL — Amiodarone HCl Inj 150 MG/3ML (50 MG/ML): INTRAVENOUS | Qty: 3 | Status: AC

## 2020-02-29 NOTE — Research (Addendum)
ID- [id number],  SOS-AMI  SUBJECT INFORMED CONSENT PROCESS   Subject Consented by:   [x]   Principal Investigator:  , MD []   Sub-Investigator: Jodelle Red, MD  Consenting Process:   [x]   The above named investigator explained the study and SIL-ICF, in their entirety,           to the Subject and Any / All third parties deemed necessary by the subject.  [x]   All medical questions, pertaining to the Study, were asked and fully answered.    [x]   Subject voiced understanding of the Informed Consent contents and is        agreeable to proceed with the SOS-AMI study.   Both the Subject and Investigator signed the SIL-ICF on:    Date of Consent: _11-JAN-2022___________   ([id number]) Time of Consent: _16:10____________  (24 hour clock)  [x]  No study related assessment or testing has been performed prior to the        Subject's ICF signature.   [x]  A signed copy of the SIL-ICF was given to the subject/ legal representative,         as applicable.   Investigator Comments:   Physical exam personally performed: GEN: Well nourished, well developed in no acute distress NECK: No JVD CARDIAC: regular rhythm, normal S1 and S2, no rubs or gallops. No murmur. VASCULAR: Radial pulses 2+ bilaterally.  RESPIRATORY:  Clear to auscultation without rales, wheezing or rhonchi  ABDOMEN: Soft, non-tender, non-distended MUSCULOSKELETAL:  Moves all 4 limbs independently SKIN: Warm and dry, no edema NEUROLOGIC:  No focal neuro deficits noted. PSYCHIATRIC:  Normal affect   [place]ip class 1 on exam today  **Form based on IDORSIA [id number]
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
## 2020-02-29 NOTE — Progress Notes (Signed)
CARDIAC REHAB PHASE I   PRE:  Rate/Rhythm: 88 SR  BP:  Sitting: 136/105 --> 129/97      SaO2: 97 RA  MODE:  Ambulation: 540 ft   POST:  Rate/Rhythm: 99 SR  BP:  Sitting: 144/94 -> 132/99 when ambulating, 119/107 sitting    SaO2: 98 RA   Pt ambulated 549ft in hallway standby assist with front wheel walker. Pt c/o some lightheaded/fogginess. Pt denied need to sit, did take several short standing rest breaks. Pt returned to bed. Pt educated on importance of ASA, Brilinta, statin, and NTG. Pt given MI book, stent card, heart healthy diet, and smoking cessation tip sheet. Pt states he is motivated to quit, but understands it will not be easy. Reviewed site care, restrictions, and exercise guidelines. Will refer to CRP II [place]. Pt and wife deny further questions or concerns at this time. Will continue to follow.  [id number] Reynold Bowen, RN BSN 02/29/2020 10:08 AM

## 2020-02-29 NOTE — Progress Notes (Signed)
Progress Note  Patient Name: Isaac Goodwin Date of Encounter: 02/29/2020  [facility] Cardiologist: No primary care provider on file. Dr. Claiborne Billings  Subjective   No cardiac chest pain like yesterday.  Still has pain from broken ribs.   Inpatient Medications    Scheduled Meds: . aspirin  81 mg Oral Daily  . atorvastatin  80 mg Oral Daily  . buPROPion  150 mg Oral Daily  . Chlorhexidine Gluconate Cloth  6 each Topical Daily  . cholecalciferol  1,000 Units Oral Daily  . enalapril  10 mg Oral Daily  . metoprolol succinate  50 mg Oral Daily  . sodium chloride flush  3 mL Intravenous Q12H  . ticagrelor  90 mg Oral BID   Continuous Infusions: . sodium chloride Stopped (02/29/20 0616)  . nitroGLYCERIN 20 mcg/min (02/29/20 0900)  . tirofiban 0.15 mcg/kg/min (02/29/20 1031)   PRN Meds: sodium chloride, acetaminophen, HYDROmorphone (DILAUDID) injection, lidocaine, methocarbamol, ondansetron (ZOFRAN) IV, sodium chloride flush, traMADol   Vital Signs    Vitals:   02/29/20 0800 02/29/20 0844 02/29/20 0900 02/29/20 1000  BP: 117/89  (!) 129/97 117/89  Pulse: 76  85 77  Resp: '18  13 15  ' Temp:  98.4 F (36.9 C)    TempSrc:  Oral    SpO2: 96%  98% 96%  Weight:      Height:        Intake/Output Summary (Last 24 hours) at 02/29/2020 1111 Last data filed at 02/29/2020 0900 Gross per 24 hour  Intake 550.78 ml  Output 550 ml  Net 0.78 ml   Last 3 Weights 02/28/2020 02/28/2020 01/25/2020  Weight (lbs) 262 lb 2 oz 262 lb 262 lb  Weight (kg) 118.9 kg 118.842 kg 118.842 kg      Telemetry    NSR, PVCs- Personally Reviewed  ECG    NSR, inferolateral Q waves - Personally Reviewed  Physical Exam   GEN: No acute distress.   Neck: No JVD Cardiac: RRR, no murmurs, rubs, or gallops.  Respiratory: Clear to auscultation bilaterally. GI: Soft, nontender, non-distended  MS: No edema; No deformity. No right radial hematoma; 2+ right radial pulse Neuro:  Nonfocal  Psych: Normal affect    Labs    High Sensitivity Troponin:   Recent Labs  Lab 02/28/20 1853  TROPONINIHS 2,542*      Chemistry Recent Labs  Lab 02/28/20 1753 02/28/20 1853 02/29/20 0058  NA 141 132* 137  K 3.6 3.8 4.0  CL 107 102 104  CO2  --  19* 21*  GLUCOSE 151* 145* 171*  BUN '13 13 12  ' CREATININE 0.80 0.94 0.99  CALCIUM  --  8.5* 9.2  PROT  --  6.2*  --   ALBUMIN  --  3.7  --   AST  --  29  --   ALT  --  25  --   ALKPHOS  --  68  --   BILITOT  --  0.7  --   GFRNONAA  --  >60 >60  ANIONGAP  --  11 12     Hematology Recent Labs  Lab 02/28/20 1753 02/28/20 1853 02/29/20 0058  WBC  --  15.4* 14.8*  RBC  --  5.51 5.41  HGB 16.7 16.8 17.4*  HCT 49.0 49.3 47.6  MCV  --  89.5 88.0  MCH  --  30.5 32.2  MCHC  --  34.1 36.6*  RDW  --  12.7 12.9  PLT  --  243 239  BNPNo results for input(s): BNP, PROBNP in the last 168 hours.   DDimer No results for input(s): DDIMER in the last 168 hours.   Radiology    CARDIAC CATHETERIZATION  Result Date: 02/28/2020  Ost RCA to Prox RCA lesion is 100% stenosed. Prior stent was occluded. There were brisk left to right collaterals from both the circumflex system and from the LAD.  1st Mrg lesion is 25% stenosed. Stent was patent with mild restenosis.  Mid Cx lesion is 100% stenosed. This was a heavily thrombotic lesion and was the culprit. A drug-eluting stent was successfully placed using a STENT RESOLUTE ONYX 3.5X18, postdilated to greater than 3.8 mm.  Post intervention, there is a 0% residual stenosis.  3rd Mrg lesion is 99% stenosed. This lesion was from embolized thrombus and was treated successfully with angioplasty. Balloon angioplasty was performed using a BALLOON SAPPHIRE 2.5X20.  Post intervention, there is a 0% residual stenosis.  2nd Mrg lesion is 100% stenosed. There was a severe lesion with likely overlying thrombus that embolized from the circumflex and was cleared with balloon angioplasty. Following the balloon, A drug-eluting  stent was successfully placed using a STENT RESOLUTE ONYX 2.0X18.  Post intervention, there is a 0% residual stenosis.  Previously placed Mid LAD stent (unknown type) is widely patent.  Dist LAD-1 lesion is 50% stenosed. Dist LAD-2 lesion is 75% stenosed. The distal LAD was a small vessel which was diffusely diseased and did not reach the apex.  There is moderate left ventricular systolic dysfunction.  LV end diastolic pressure is severely elevated. LVEDP 35 mmHg.  The left ventricular ejection fraction is 25-35% by visual estimate. Inferior and apical hypokinesis.  There is no aortic valve stenosis.  A drug-eluting stent was successfully placed using a STENT RESOLUTE ONYX 3.5X18.  Balloon angioplasty was performed using a BALLOON SAPPHIRE 2.5X20.  A drug-eluting stent was successfully placed using a STENT RESOLUTE ONYX 2.0X18.  Severe multivessel coronary artery disease.  Culprit lesion was the mid circumflex.  His symptoms have been going on for nearly 48 hours intermittently, and the lesion was heavily thrombotic.  Due to the late presentation, there was some embolic phenomenon noted in the distal vessels.  The mid circumflex was successfully revascularized which help restore collateral flow to the RCA.  Severe disease in the distal LAD which is a small vessel. Watch in ICU.  Dual antiplatelet therapy.  Consider clopidogrel monotherapy after his dual antiplatelet therapy due to diffuse severe disease. Blood pressure was high by the end of the procedure.  IV nitroglycerin was started.  LVEDP was also high.  We did not order post-cath fluids.  Hopefully, the IV nitroglycerin will help and we will give IV Lasix as well.  Hold ACE inhibitor for now.  Check renal function in a.m.  Given highly thrombotic lesion with emboli to multiple branches, will continue IV tirofiban for 18 hours, as long as there are no bleeding issues from his right radial. I discussed the results with the patient's father over the  phone, and met the patient's wife in person.     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  PCI to circumflex territory.  Echo pending. COntinue beta blocker. Severe hyperlipidemia: Atorvastatin started.  hehad concerns about muscle cramps in the past.  Was not taking anything for cholesterol before arrival. Will likely  need PCSK9-inhibitor in the future. THis was discussed.  2) HTN: Cr stable.  Add ACE-I back.  Wean NTG.  OK to transfer to tele.  3) Pain control.  Decrease Dilaudid frequency.  Add tramadol 100 mg q6 hours.  He has requested q2hour Dilaudid.  Notes from PMD show that he requested pain meds sooner than due.  WIll try to get him off the IV dilaudid soon.  Stop Dilaudid after a few doses of the oral pain meds.  Will given another dose of Lasix today as well. Elevated LVEDP, decreased LVEF.  For questions or updates, please contact [place] Please consult [email or web address] for contact info under        Signed, Larae Grooms, MD  02/29/2020, 11:11 AM

## 2020-02-29 NOTE — Progress Notes (Signed)
  Echocardiogram 2D Echocardiogram has been performed.  Isaac Goodwin 02/29/2020, 11:15 AM

## 2020-02-29 NOTE — Research (Signed)
ID- [id number],  SOS-AMI  Authorized Site Personnel  [x]   , RN []   Eulogio Ditch, RN  []   Amy , RCIS  []   Mercer Pod, RN   Date of study introduction: ___11-JAN-2022___________   ([id number])    Time of introduction: _1100____  (24 hour clock)  During the subject's hospital visit , the authorized site personnel discussed with  with the subject the possibility to participate in the SOS-AMI study, and provided  the subject with the approved Subject Information Leaflet (SIL)-Informed Consent form (ICF) in a language that he/she can understand.  The subject took the document home to reflect on his/her participation in this study before signing it.   []   The Subject will return to the Cardiovascular Research office, at a later day, to            complete the consenting process.  OR  [x]   The Subject will be given ample time to reflect on this study but will be completing            consent process prior to his/ her discharge from this admission.  Discussed study with both patient and his wife.   **Form based on [employer] [id number] [id number]

## 2020-02-29 NOTE — Progress Notes (Incomplete)
NCM received consult: The pt does not have insurance, but is on some type of program. Our [facility] pharmacy is unable to see if Brilinta 90 mg BID would be affordable for the pt. How much would this be for the the pt? Looks like pt has Med pay

## 2020-03-01 ENCOUNTER — Encounter: Payer: Self-pay | Admitting: *Deleted

## 2020-03-01 ENCOUNTER — Other Ambulatory Visit (HOSPITAL_COMMUNITY): Payer: Self-pay | Admitting: Cardiology

## 2020-03-01 ENCOUNTER — Telehealth: Payer: Self-pay | Admitting: Cardiovascular Disease

## 2020-03-01 DIAGNOSIS — E8779 Other fluid overload: Secondary | ICD-10-CM

## 2020-03-01 DIAGNOSIS — Z006 Encounter for examination for normal comparison and control in clinical research program: Secondary | ICD-10-CM

## 2020-03-01 DIAGNOSIS — E785 Hyperlipidemia, unspecified: Secondary | ICD-10-CM

## 2020-03-01 DIAGNOSIS — I252 Old myocardial infarction: Secondary | ICD-10-CM

## 2020-03-01 LAB — BASIC METABOLIC PANEL
Anion gap: 11 (ref 5–15)
BUN: 17 mg/dL (ref 6–20)
CO2: 23 mmol/L (ref 22–32)
Calcium: 8.9 mg/dL (ref 8.9–10.3)
Chloride: 99 mmol/L (ref 98–111)
Creatinine, Ser: 0.99 mg/dL (ref 0.61–1.24)
GFR, Estimated: >60 mL/min (ref >60)
Glucose, Bld: 138 mg/dL — ABNORMAL HIGH (ref 70–99)
Potassium: 3.3 mmol/L — ABNORMAL LOW (ref 3.5–5.1)
Sodium: 133 mmol/L — ABNORMAL LOW (ref 135–145)

## 2020-03-01 MED ORDER — POTASSIUM CHLORIDE CRYS ER 20 MEQ PO TBCR
40.0000 meq | EXTENDED_RELEASE_TABLET | Freq: Once | ORAL | Status: AC
  Administered 2020-03-01: 40 meq via ORAL
  Filled 2020-03-01: qty 2

## 2020-03-01 MED ORDER — NITROGLYCERIN 0.4 MG SL SUBL: 0.4000 mg | SUBLINGUAL_TABLET | SUBLINGUAL | 2 refills | Status: DC | PRN

## 2020-03-01 MED ORDER — ASPIRIN 81 MG PO TABS: 81.0000 mg | ORAL_TABLET | Freq: Every day | ORAL | 1 refills | Status: DC

## 2020-03-01 MED ORDER — STUDY - SOS-AMI - PLACEBO FOR SELATOGREL 0 MG/0.5 ML SQ INJECTION FOR SCREENING USE ONLY (PI-CHRISTOPHER)
0.5000 mL | INJECTION | Freq: Once | SUBCUTANEOUS | Status: DC
  Filled 2020-03-01: qty 0.5

## 2020-03-01 MED ORDER — ATORVASTATIN CALCIUM 80 MG PO TABS: 80.0000 mg | ORAL_TABLET | Freq: Every day | ORAL | 1 refills | Status: DC

## 2020-03-01 MED ORDER — POTASSIUM CHLORIDE ER 20 MEQ PO TBCR: 20.0000 meq | EXTENDED_RELEASE_TABLET | Freq: Every day | ORAL | 0 refills | Status: DC

## 2020-03-01 MED ORDER — FUROSEMIDE 40 MG PO TABS: 40.0000 mg | ORAL_TABLET | Freq: Every day | ORAL | 0 refills | Status: DC

## 2020-03-01 MED ORDER — TICAGRELOR 90 MG PO TABS: 90.0000 mg | ORAL_TABLET | Freq: Two times a day (BID) | ORAL | 1 refills | Status: DC

## 2020-03-01 MED FILL — FUROSEMIDE 40 MG TABLET: 40 | 30 days supply | Qty: 30 | Fill #0

## 2020-03-01 MED FILL — ATORVASTATIN CALCIUM 80 MG: 80 | 30 days supply | Qty: 30 | Fill #0

## 2020-03-01 MED FILL — BRILINTA 90 MG TABLET: 90 | 30 days supply | Qty: 60 | Fill #0

## 2020-03-01 MED FILL — ASPIRIN LOW DOSE 81 MG TBEC: 81 | 90 days supply | Qty: 90 | Fill #0

## 2020-03-01 MED FILL — POTASSIUM CHLORIDE 20meqER: 20 | 30 days supply | Qty: 30 | Fill #0

## 2020-03-01 MED FILL — NITROGLYCERIN 0.4 MG TAB SL: 0.4 | 14 days supply | Qty: 25 | Fill #0

## 2020-03-01 NOTE — Progress Notes (Signed)
D/C instructions given and reviewed. No questions asked but encouraged to call with any concerns. Tele and IV removed, tolerated well. 

## 2020-03-01 NOTE — Progress Notes (Signed)
NT was about to get his AM WT. However pt stated " he does not want to get up as he don't want his pain flare up" last standing Wt was obtained - 114,2 kg at 20:30. Will pass it on.

## 2020-03-01 NOTE — Care Management (Signed)
03-01-20 Case Manager received a consult for medication assistance. Case Manager spoke with patient and he is currently not working. MATCH completed for this patient and medications will be delivered to the room via [facility]. Hospital follow up appointment scheduled and information is on the AVS. No further needs from Case Manager at this time. Gala Lewandowsky, RN,BSN Case Manager

## 2020-03-01 NOTE — Progress Notes (Signed)
CARDIAC REHAB PHASE I   Offered to walk with pt. Pt states independent ambulation without difficulty. Dizziness improved since yesterday. Reinforced importance of medication compliance. Reviewed restrictions, site care, and daily weights. Pt anxious to go home. Referred to [facility] [place].  [id number] Reynold Bowen, RN BSN 03/01/2020 10:06 AM

## 2020-03-01 NOTE — Telephone Encounter (Signed)
TOC per [name] on 03/10/20 at 2:15pm with Azalee Course

## 2020-03-01 NOTE — Telephone Encounter (Signed)
Currently admitted.

## 2020-03-01 NOTE — Discharge Summary (Addendum)
Discharge Summary    Patient ID: Isaac Goodwin MRN: [id number]; DOB: [birth year]  Admit date: 02/28/2020 Discharge date: 03/01/2020  Primary Care Provider: [name], NP  Primary Cardiologist: Shelva Majestic, MD  Primary Electrophysiologist:  None   Discharge Diagnoses    Principal Problem:   Acute inferolateral myocardial infarction [facility]) Active Problems:   Essential hypertension   Hyperlipidemia with target LDL less than 70    Diagnostic Studies/Procedures    Cath: 02/28/20   Ost RCA to Prox RCA lesion is 100% stenosed. Prior stent was occluded. There were brisk left to right collaterals from both the circumflex system and from the LAD.  1st Mrg lesion is 25% stenosed. Stent was patent with mild restenosis.  Mid Cx lesion is 100% stenosed. This was a heavily thrombotic lesion and was the culprit. A drug-eluting stent was successfully placed using a STENT RESOLUTE ONYX 3.5X18, postdilated to greater than 3.8 mm.  Post intervention, there is a 0% residual stenosis.  3rd Mrg lesion is 99% stenosed. This lesion was from embolized thrombus and was treated successfully with angioplasty. Balloon angioplasty was performed using a BALLOON SAPPHIRE 2.5X20.  Post intervention, there is a 0% residual stenosis.  2nd Mrg lesion is 100% stenosed. There was a severe lesion with likely overlying thrombus that embolized from the circumflex and was cleared with balloon angioplasty. Following the balloon, A drug-eluting stent was successfully placed using a STENT RESOLUTE ONYX 2.0X18.  Post intervention, there is a 0% residual stenosis.  Previously placed Mid LAD stent (unknown type) is widely patent.  Dist LAD-1 lesion is 50% stenosed. Dist LAD-2 lesion is 75% stenosed. The distal LAD was a small vessel which was diffusely diseased and did not reach the apex.  There is moderate left ventricular systolic dysfunction.  LV end diastolic pressure is severely elevated. LVEDP 35  mmHg.  The left ventricular ejection fraction is 25-35% by visual estimate. Inferior and apical hypokinesis.  There is no aortic valve stenosis.  A drug-eluting stent was successfully placed using a STENT RESOLUTE ONYX 3.5X18.  Balloon angioplasty was performed using a BALLOON SAPPHIRE 2.5X20.  A drug-eluting stent was successfully placed using a STENT RESOLUTE ONYX 2.0X18.   Severe multivessel coronary artery disease.  Culprit lesion was the mid circumflex.  His symptoms have been going on for nearly 48 hours intermittently, and the lesion was heavily thrombotic.  Due to the late presentation, there was some embolic phenomenon noted in the distal vessels.  The mid circumflex was successfully revascularized which help restore collateral flow to the RCA.  Severe disease in the distal LAD which is a small vessel.  Watch in ICU.  Dual antiplatelet therapy.  Consider clopidogrel monotherapy after his dual antiplatelet therapy due to diffuse severe disease.  Blood pressure was high by the end of the procedure.  IV nitroglycerin was started.  LVEDP was also high.  We did not order post-cath fluids.  Hopefully, the IV nitroglycerin will help and we will give IV Lasix as well.  Hold ACE inhibitor for now.  Check renal function in a.m.  Given highly thrombotic lesion with emboli to multiple branches, will continue IV tirofiban for 18 hours, as long as there are no bleeding issues from his right radial.  I discussed the results with the patient's father over the phone, and met the patient's wife in person.     Diagnostic Dominance: Right    Intervention     Echo: 02/29/20  IMPRESSIONS    1. Left ventricular  ejection fraction, by estimation, is 50 to 55%. The  left ventricle has low normal function. The left ventricle demonstrates  regional wall motion abnormalities with basal to mid inferolateral  hypokinesis. Left ventricular diastolic  parameters are consistent with Grade I  diastolic dysfunction (impaired  relaxation).  2. Right ventricular systolic function is normal. The right ventricular  size is normal. Tricuspid regurgitation signal is inadequate for assessing  PA pressure.  3. The mitral valve is normal in structure. No evidence of mitral valve  regurgitation. No evidence of mitral stenosis.  4. The aortic valve is tricuspid. Aortic valve regurgitation is not  visualized. Mild aortic valve sclerosis is present, with no evidence of  aortic valve stenosis.  5. Aortic dilatation noted. There is mild dilatation of the ascending  aorta, measuring 37 mm.  6. The inferior vena cava is normal in size with greater than 50%  respiratory variability, suggesting right atrial pressure of 3 mmHg.  _____________   History of Present Illness     Isaac Goodwin is a 50 y.o. male with PMH of CAD with multivessel stenting in 2009, HTN, HLD, OSA who presented with chest pain and CODE STEMI called. Reported 2 nights prior to admission he developed chest pain. Said he had chest discomfort from broken ribs and pneumothorax but this was different. Had received his COVID booster earlier that day. EKG with EMS showed ST elevation in inferior leads with reciprocal ST changes. He was given SL nitro with morphine and brought to the cath lab for emergent cardiac cath.   Hospital Course     1. Inferior STEMI: underwent cardiac cath noted above with PCI/DES to the Lcx, and DES to the 2nd OM with balloon angioplasty to 3rd OM. Patent stents in the mLAD and 1st OM. Occluded RCA stent with left to right collaterals from the Lcx and LAD. Placed on DAPT with ASA/Brilinta for at least one year. Likely plan for plavix as monotherapy beyond one year given severe diffuse disease. Echo with low normal EF with basal to mid inferolateral hypokinesis. He was given IV lasix with good diuresis. Plan for lasix 40mg  daily with K+ until follow up in the office.  -- on ASA, Brilinta, statin and BB. Of  note, Brilinta cost may be an issue pending his coverage as it is the first of the year. He will let us know at his follow up appt about the cost.   2. HLD: reports he has been on statin therapy in the past, but developed muscle cramps. Has been using Coq10 with improvement. LDL 209. -- will continue on atorvastatin 80mg  daily. Suspect he will not achieve goal of LDL <70. Would plan for lipid clinic referral as an outpatient  3. HTN: soft but tolerating the addition of metoprolol and ACE. Instructed to follow readings at home.  4. Rib pain: followed through the [facility] for refills on tramadol.   Did the patient have an acute coronary syndrome (MI, NSTEMI, STEMI, etc) this admission?:  Yes                               AHA/ACC Clinical Performance & Quality Measures: 1. Aspirin prescribed? - Yes 2. ADP Receptor Inhibitor (Plavix/Clopidogrel, Brilinta/Ticagrelor or Effient/Prasugrel) prescribed (includes medically managed patients)? - Yes 3. Beta Blocker prescribed? - Yes 4. High Intensity Statin (Lipitor 40-80mg  or Crestor 20-40mg ) prescribed? - Yes 5. EF assessed during THIS hospitalization? - Yes 6. For EF <40%,  was ACEI/ARB prescribed? - Not Applicable (EF >/= 40%) 7. For EF <40%, Aldosterone Antagonist (Spironolactone or Eplerenone) prescribed? - Not Applicable (EF >/= 40%) 8. Cardiac Rehab Phase II ordered (including medically managed patients)? - Yes       _____________  Discharge Vitals Blood pressure 104/71, pulse 74, temperature 98.3 F (36.8 C), temperature source Oral, resp. rate 20, height 6\' 5"  (1.956 m), weight 114 kg, SpO2 97 %.  Filed Weights   02/28/20 2334 02/29/20 2030 03/01/20 0736  Weight: 118.9 kg 114.2 kg 114 kg    Labs & Radiologic Studies    CBC Recent Labs    02/28/20 1853 02/29/20 0058  WBC 15.4* 14.8*  HGB 16.8 17.4*  HCT 49.3 47.6  MCV 89.5 88.0  PLT 243 239   Basic Metabolic Panel Recent Labs    04/28/20 0058 03/01/20 0247  NA 137 133*  K  4.0 3.3*  CL 104 99  CO2 21* 23  GLUCOSE 171* 138*  BUN 12 17  CREATININE 0.99 0.99  CALCIUM 9.2 8.9   Liver Function Tests Recent Labs    02/28/20 1853  AST 29  ALT 25  ALKPHOS 68  BILITOT 0.7  PROT 6.2*  ALBUMIN 3.7   No results for input(s): LIPASE, AMYLASE in the last 72 hours. High Sensitivity Troponin:   Recent Labs  Lab 02/28/20 1853  TROPONINIHS 2,542*    BNP Invalid input(s): POCBNP D-Dimer No results for input(s): DDIMER in the last 72 hours. Hemoglobin A1C Recent Labs    02/28/20 1853  HGBA1C 5.7*   Fasting Lipid Panel Recent Labs    02/28/20 1853  CHOL 256*  HDL 34*  LDLCALC 209*  TRIG 64  CHOLHDL 7.5   Thyroid Function Tests No results for input(s): TSH, T4TOTAL, T3FREE, THYROIDAB in the last 72 hours.  Invalid input(s): FREET3 _____________  CARDIAC CATHETERIZATION  Result Date: 02/28/2020  Ost RCA to Prox RCA lesion is 100% stenosed. Prior stent was occluded. There were brisk left to right collaterals from both the circumflex system and from the LAD.  1st Mrg lesion is 25% stenosed. Stent was patent with mild restenosis.  Mid Cx lesion is 100% stenosed. This was a heavily thrombotic lesion and was the culprit. A drug-eluting stent was successfully placed using a STENT RESOLUTE ONYX 3.5X18, postdilated to greater than 3.8 mm.  Post intervention, there is a 0% residual stenosis.  3rd Mrg lesion is 99% stenosed. This lesion was from embolized thrombus and was treated successfully with angioplasty. Balloon angioplasty was performed using a BALLOON SAPPHIRE 2.5X20.  Post intervention, there is a 0% residual stenosis.  2nd Mrg lesion is 100% stenosed. There was a severe lesion with likely overlying thrombus that embolized from the circumflex and was cleared with balloon angioplasty. Following the balloon, A drug-eluting stent was successfully placed using a STENT RESOLUTE ONYX 2.0X18.  Post intervention, there is a 0% residual stenosis.   Previously placed Mid LAD stent (unknown type) is widely patent.  Dist LAD-1 lesion is 50% stenosed. Dist LAD-2 lesion is 75% stenosed. The distal LAD was a small vessel which was diffusely diseased and did not reach the apex.  There is moderate left ventricular systolic dysfunction.  LV end diastolic pressure is severely elevated. LVEDP 35 mmHg.  The left ventricular ejection fraction is 25-35% by visual estimate. Inferior and apical hypokinesis.  There is no aortic valve stenosis.  A drug-eluting stent was successfully placed using a STENT RESOLUTE ONYX 3.5X18.  Balloon angioplasty was performed  using a BALLOON SAPPHIRE 2.5X20.  A drug-eluting stent was successfully placed using a STENT RESOLUTE ONYX 2.0X18.  Severe multivessel coronary artery disease.  Culprit lesion was the mid circumflex.  His symptoms have been going on for nearly 48 hours intermittently, and the lesion was heavily thrombotic.  Due to the late presentation, there was some embolic phenomenon noted in the distal vessels.  The mid circumflex was successfully revascularized which help restore collateral flow to the RCA.  Severe disease in the distal LAD which is a small vessel. Watch in ICU.  Dual antiplatelet therapy.  Consider clopidogrel monotherapy after his dual antiplatelet therapy due to diffuse severe disease. Blood pressure was high by the end of the procedure.  IV nitroglycerin was started.  LVEDP was also high.  We did not order post-cath fluids.  Hopefully, the IV nitroglycerin will help and we will give IV Lasix as well.  Hold ACE inhibitor for now.  Check renal function in a.m.  Given highly thrombotic lesion with emboli to multiple branches, will continue IV tirofiban for 18 hours, as long as there are no bleeding issues from his right radial. I discussed the results with the patient's father over the phone, and met the patient's wife in person.    ECHOCARDIOGRAM COMPLETE  Result Date: 02/29/2020    ECHOCARDIOGRAM REPORT    Patient Name:   Isaac Goodwin Date of Exam: 02/29/2020 Medical Rec #:  [id number]    Height:       76.0 in Accession #:    [id number]   Weight:       262.1 lb Date of Birth:  January 28, 1971    BSA:          2.485 m Patient Age:    52 years     BP:           129/97 mmHg Patient Gender: M            HR:           71 bpm. Exam Location:  Inpatient Procedure: 2D Echo Indications:    acute myocardial infarction  History:        Patient has prior history of Echocardiogram examinations, most                 recent 11/05/2011. CAD; Risk Factors:Dyslipidemia and Sleep                 Apnea.  Sonographer:    Johny Chess Referring Phys: [place]  1. Left ventricular ejection fraction, by estimation, is 50 to 55%. The left ventricle has low normal function. The left ventricle demonstrates regional wall motion abnormalities with basal to mid inferolateral hypokinesis. Left ventricular diastolic parameters are consistent with Grade I diastolic dysfunction (impaired relaxation).  2. Right ventricular systolic function is normal. The right ventricular size is normal. Tricuspid regurgitation signal is inadequate for assessing PA pressure.  3. The mitral valve is normal in structure. No evidence of mitral valve regurgitation. No evidence of mitral stenosis.  4. The aortic valve is tricuspid. Aortic valve regurgitation is not visualized. Mild aortic valve sclerosis is present, with no evidence of aortic valve stenosis.  5. Aortic dilatation noted. There is mild dilatation of the ascending aorta, measuring 37 mm.  6. The inferior vena cava is normal in size with greater than 50% respiratory variability, suggesting right atrial pressure of 3 mmHg. FINDINGS  Left Ventricle: Left ventricular ejection fraction, by estimation, is 50 to 55%. The  left ventricle has low normal function. The left ventricle demonstrates regional wall motion abnormalities. The left ventricular internal cavity size was normal in size.  There is no left ventricular hypertrophy. Left ventricular diastolic parameters are consistent with Grade I diastolic dysfunction (impaired relaxation). Right Ventricle: The right ventricular size is normal. No increase in right ventricular wall thickness. Right ventricular systolic function is normal. Tricuspid regurgitation signal is inadequate for assessing PA pressure. Left Atrium: Left atrial size was normal in size. Right Atrium: Right atrial size was normal in size. Pericardium: There is no evidence of pericardial effusion. Mitral Valve: The mitral valve is normal in structure. No evidence of mitral valve regurgitation. No evidence of mitral valve stenosis. Tricuspid Valve: The tricuspid valve is normal in structure. Tricuspid valve regurgitation is not demonstrated. Aortic Valve: The aortic valve is tricuspid. Aortic valve regurgitation is not visualized. Mild aortic valve sclerosis is present, with no evidence of aortic valve stenosis. Pulmonic Valve: The pulmonic valve was normal in structure. Pulmonic valve regurgitation is not visualized. Aorta: Aortic dilatation noted. There is mild dilatation of the ascending aorta, measuring 37 mm. Venous: The inferior vena cava is normal in size with greater than 50% respiratory variability, suggesting right atrial pressure of 3 mmHg. IAS/Shunts: No atrial level shunt detected by color flow Doppler.  LEFT VENTRICLE PLAX 2D LVIDd:         5.90 cm     Diastology LVIDs:         3.20 cm     LV e' medial:    7.18 cm/s LV PW:         1.10 cm     LV E/e' medial:  12.7 LV IVS:        1.10 cm     LV e' lateral:   5.98 cm/s LVOT diam:     2.10 cm     LV E/e' lateral: 15.2 LV SV:         87 LV SV Index:   35 LVOT Area:     3.46 cm  LV Volumes (MOD) LV vol d, MOD A2C: 97.7 ml LV vol d, MOD A4C: 80.7 ml LV vol s, MOD A2C: 49.3 ml LV vol s, MOD A4C: 40.0 ml LV SV MOD A2C:     48.4 ml LV SV MOD A4C:     80.7 ml LV SV MOD BP:      46.3 ml RIGHT VENTRICLE             IVC RV S prime:      16.30 cm/s  IVC diam: 1.60 cm TAPSE (M-mode): 2.0 cm LEFT ATRIUM             Index       RIGHT ATRIUM           Index LA diam:        4.50 cm 1.81 cm/m  RA Area:     14.30 cm LA Vol (A2C):   54.2 ml 21.81 ml/m RA Volume:   32.60 ml  13.12 ml/m LA Vol (A4C):   42.4 ml 17.06 ml/m LA Biplane Vol: 51.2 ml 20.60 ml/m  AORTIC VALVE LVOT Vmax:   122.00 cm/s LVOT Vmean:  78.700 cm/s LVOT VTI:    0.251 m  AORTA Ao Root diam: 3.30 cm Ao Asc diam:  3.70 cm MITRAL VALVE MV Area (PHT): 3.77 cm     SHUNTS MV Decel Time: 201 msec     Systemic VTI:  0.25 m MV  E velocity: 90.90 cm/s   Systemic Diam: 2.10 cm MV A velocity: 123.00 cm/s MV E/A ratio:  0.74 Marca Ancona MD Electronically signed by Marca Ancona MD Signature Date/Time: 02/29/2020/4:56:29 PM    Final    Disposition   Pt is being discharged home today in good condition.  Follow-up Plans & Appointments     Follow-up Information    [facility]. Go on 03/27/2020.   Why: Post hospital follow up scheduled for 03/27/2020 @ 9:30 am with your PCP: Shon Hale Flemings Contact information: [address] [facility] [address] [phone]       Azalee Course, [place] Follow up on 03/10/2020.   Specialties: Cardiology, Radiology Why: at 2:15pm for your follow up appt. Contact information: [address] [place] [address] [phone]              Discharge Instructions    Amb Referral to Cardiac Rehabilitation   Complete by: As directed    Diagnosis:  Coronary Stents STEMI     After initial evaluation and assessments completed: Virtual Based Care may be provided alone or in conjunction with Phase 2 Cardiac Rehab based on patient barriers.: Yes   Call MD for:  difficulty breathing, headache or visual disturbances   Complete by: As directed    Call MD for:  persistant dizziness or light-headedness   Complete by: As directed    Call MD for:  redness, tenderness, or signs of infection  (pain, swelling, redness, odor or green/yellow discharge around incision site)   Complete by: As directed    Diet - low sodium heart healthy   Complete by: As directed    Discharge instructions   Complete by: As directed    PLEASE DO NOT MISS ANY DOSES OF YOUR BRILINTA!!!!! Also keep a log of you blood pressures and bring back to your follow up appt. Please call the office with any questions.   Patients taking blood thinners should generally stay away from medicines like ibuprofen, Advil, Motrin, naproxen, and Aleve due to risk of stomach bleeding. You may take Tylenol as directed or talk to your primary doctor about alternatives.   PLEASE ENSURE THAT YOU DO NOT RUN OUT OF YOUR BRILINTA. This medication is very important to remain on for at least one year. IF you have issues obtaining this medication due to cost please CALL the office 3-5 business days prior to running out in order to prevent missing doses of this medication.   Increase activity slowly   Complete by: As directed       Discharge Medications   Allergies as of 03/01/2020      Reactions   Gabapentin Other (See Comments)   LOC changes- memory loss   Penicillins Anaphylaxis, Shortness Of Breath, Other (See Comments)   "Was told as a child that it caused breathing problems"      Medication List    STOP taking these medications   ibuprofen 600 MG tablet Commonly known as: ADVIL     TAKE these medications   acetaminophen 500 MG tablet Commonly known as: TYLENOL Take 2 tablets (1,000 mg total) by mouth every 8 (eight) hours as needed for mild pain, fever or headache.   ascorbic acid 500 MG tablet Commonly known as: VITAMIN C Take 1,000 mg by mouth daily.   aspirin 81 MG tablet Take 1 tablet (81 mg total) by mouth daily. What changed: when to take this   atorvastatin 80 MG tablet  Commonly known as: LIPITOR Take 1 tablet (80 mg total) by mouth daily. Start taking on: March 02, 2020 What changed:    medication strength  how much to take   buPROPion 150 MG 12 hr tablet Commonly known as: Wellbutrin SR TAKE 1 TABLET DAILY FOR ONE WEEK WHILE DECREASING SMOKING. STOP SMOKING AND INCREASE TO 1 TABLET TWICE A DAY FOR 7 WEEKS. What changed:   how much to take  how to take this  when to take this  additional instructions   cholecalciferol 25 MCG (1000 UNIT) tablet Commonly known as: VITAMIN D3 Take 2,000 Units by mouth daily.   enalapril 10 MG tablet Commonly known as: VASOTEC Take 1 tablet (10 mg total) by mouth daily.   furosemide 40 MG tablet Commonly known as: Lasix Take 1 tablet (40 mg total) by mouth daily.   lidocaine 5 % Commonly known as: LIDODERM Place 1 patch onto the skin daily as needed (for back pain).   methocarbamol 500 MG tablet Commonly known as: ROBAXIN TAKE 2 TABLETS (1,000 MG TOTAL) BY MOUTH EVERY 6 (SIX) HOURS AS NEEDED FOR MUSCLE SPASMS.   metoprolol succinate 50 MG 24 hr tablet Commonly known as: TOPROL-XL Take 1 tablet (50 mg total) by mouth daily. Take with or immediately following a meal.   nitroGLYCERIN 0.4 MG SL tablet Commonly known as: Nitrostat Place 1 tablet (0.4 mg total) under the tongue every 5 (five) minutes as needed.   Potassium Chloride ER 20 MEQ Tbcr Take 20 mEq by mouth daily.   ticagrelor 90 MG Tabs tablet Commonly known as: BRILINTA Take 1 tablet (90 mg total) by mouth 2 (two) times daily.   Vitamin D (Ergocalciferol) 1.25 MG (50000 UNIT) Caps capsule Commonly known as: DRISDOL Take 1 capsule (50,000 Units total) by mouth every 7 (seven) days.         Outstanding Labs/Studies   FLP/LFTs in 8 weeks.   Duration of Discharge Encounter   Greater than 30 minutes including physician time.  Signed, Reino Bellis, NP 03/01/2020, 11:31 AM  I have examined the patient and reviewed assessment and plan and discussed with patient.  Agree with above as stated.    No further arrhythmia on tele.  He walked this AM  and did well.  Had some DOE when he tried to walk fast.  This should improve.  Was volume overloaded.  We spoke about low salt diet.  Plant based foods preferable.  I explained to him that PCSK9 inhibitor may be the best option for him, but he wanted to try more exercise.  LDL over 200.  He needs to avoid tobacco.    I stressed the importance of staying on his DAPT.   OK for discharge later today.  He consented to SOS-AMI trial of selatogrel autoinjector.  Larae Grooms

## 2020-03-01 NOTE — Research (Addendum)
[id number], [id number]          Subject ID: [id number] Trainer's name: Eulogio Ditch, RN  Trainer's signature:  on file- Delegation of Authority Log .sos   Date of visit:  01-Mar-2020   ELIGIBILITY:  INCLUSION / EXCLUSION CRITERIA  Inclusion Criteria:  1. Signed and dated informed consent  []   No   [x]   Yes  26. >/= 52 years old (or age of majority in local region). []   No  [x]   Yes  3.  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  []   No  [x]   Yes  4. Presence of either a second prior AMI within 1 year of screening  [x]  No []  Yes         Or at least 2 of the following:       A.  Second  prior AMI more than 1 year before screening  []   No  [x]   Yes     B.  Diabetes Mellitus [x]   No  []   Yes     C.  Chronic Kidney Disease    [x]   No  []   Yes     D.  Multivessel Coronary Artery Disease   []   No  [x]   Yes     E.  Peripheral Artery Disease  [x]   No  []   Yes     F.  Age >/= 65 years  [x]   No  []   yes     G. Absence of coronary revascularization of the qualifying AMI.  [x]   No  []  Yes     H. Active daily smoking at screening []   No  [x]   Yes  5.  Subject having successfully self-administered placebo during screening. []   No  [x]   Yes  6.  Women of childbearing potential who fulfill the following criteria: N/A      - Negative pregnancy test (Urine or Serum) at randomization    []   No  []   Yes      - Agreement to use an acceptable contraceptive method. []   No  []   Yes  Exclusion Criteria:  1.  Increased risk of serious bleeding including any of the following:       A. History of intracranial bleed.  [x]   No  []   Yes     B. Known uncorrected intracranial vascular abnormality [x]   No  []   Yes     C. Gastrointestinal bleed requiring hospitalization or transfusion            Within 1 year prior to screening  [x]   No  []   Yes     D. Subjects on oral triple antithrombotic therapy [x]   No  []   Yes     E.  Known liver impairment significantly affecting hepatic function [x]   No  []   Yes     F. Current dialysis  [x]   No  []   Yes     G. Ischemic stroke or transient ischemic attack within 3 months of screening [x]  No  []  Yes  2. Chronic anemia with hemoglobin <10 g/dL [x]   No  []   Yes  3. Chronic thrombocytopenia with platelet count <100,000 /mm3.      [x]   No  []   Yes  4. Concomitant diseases or conditions that in the opinion of the investigator are  not compatible with study participation.   [x]   No  []  Yes   5. Known hypersensitivity to selatogrel,m any of its excipients, or drugs           of the P2Y12 class.  [x]   No  []   Yes  6. Previous exposure to an investigational drug within 3 months            prior to randomization     [x]   No  []   Yes   7. Participation in another clinical trial with an investigational product     or device within 3 months prior to randomization    [x]   No   []   Yes  8.  Pregnant, planning to become pregnant, or lactating women  [x]   No  []   Yes  9.  Known concomitant life-threatening disease with a                       life expectancy <12 months         [x]   No  []   Yes   DID SUBJECT MEET ALL THE ELIGIBILITY CRITERIA?  []  NO   [x]  YES  If NO, please list Inclusion/Exclusion criteria number(s) not met________________    Form based on [employer] source document  eCRF  Version 5.0 - 15-Dec-2019 page 16

## 2020-03-01 NOTE — Plan of Care (Signed)

## 2020-03-02 ENCOUNTER — Encounter: Payer: Self-pay | Admitting: Nurse Practitioner

## 2020-03-02 ENCOUNTER — Telehealth: Payer: Self-pay

## 2020-03-02 NOTE — Research (Signed)
  SCREENING: PATIENT TRAINING- DEMO DEVICE   1. Was the training delivered?   []  NO  [x]  YES        1.1 Date of training:  12-JAN-2022_   dd/mmm/yyyy     1.2 Start time: _0900__  24 hour clock  1.3 Stop time: __1015_  24 hour clock  2. Did the Placebo self-injection occur? []  NO  [x]   YES  2.1 Autoinjector ID/Label:  KIT# [id number]    Difficulties in using the autoinjector for the PLACEBO self-injection:  3. Were there any difficulties in performing the placebo self-injection?  [x]  NO  []  YES  Difficulties with Step1: Choose an injection site?  3.1.1. Was the injection site as defined in the protocol (abdomen/thigh)? []  NO  [x]   YES 3.1.2 Was the injection done on bare skin? []  NO  [x]   YES 3.1.3 Did the subject report any other difficulties in choosing injection site? [x]  NO  []  YES                 If YES, Please specify: ______________________  Difficulties with Step 2: Twist cap off?  3.2.1  Did the subject twist the cap to remove it? []  NO  [x]   YES 3.2.2    Did the subject twist the cap counterclockwise? []   NO  [x]   YES 3.2.3   Did the force/ torque applied sufficient to twist the cap off?  []   NO  [x]   YES 3.2.4 Did the subject report any other difficulties in twisting the cap off? [x]   NO  []  YES                If YES, please specify:___________________  Difficulties with Step 3: Pinch skin and place the autoinjector:  3.3.1  Did the subject pinch skin at injection site? []   NO  [x]   YES 3.3.2 Did the subject place the autoinjector perpendicular to the skin? []   NO  [x]   YES 3.3.3 Did the subject report any other difficulties pinching the skin and placing                  the autoinejctor?  [x]   NO  []   YES  Difficulties with Step 4: Firmly puch down and hold for 3 seconds  3.4.1 Did the subject attempt to inject with the autoinjector in the right position,      i.e needle end down?  []   NO  [x]   YES 3.4.2 Did the subject push down firmly until it clicked? []  NO  [x]    YES 3.4.3 Did the subject hold the autoinjector for about 3 seconds or until the        Viewing window turned orange?  []   NO  [x]  YES 3.4.4 Did the subject report any other difficulties pushing firmly down? [x]  NO  []   YES     If YES, please specify: _________________  [employer]   SOS-AMI_CRF_Version 5.0_27OCT2021  pgs 6,7

## 2020-03-02 NOTE — Telephone Encounter (Addendum)
Transition Care Management Unsuccessful Follow-up Telephone Call  Date of discharge and from where:  03/01/2020, [facility]   Attempts:  1st Attempt  Reason for unsuccessful TCM follow-up call:  Unable to leave message - call placed to # [phone] twice and the messaged stated that the call cannot be completed at this time   Patient has follow up appointment scheduled with [name], NP 03/27/2020.

## 2020-03-02 NOTE — Research (Signed)
error 

## 2020-03-02 NOTE — Research (Signed)
Danella Deis Subject                 [id number], [id number]   Subject ID:  Trainer's name:  Trainer's signature:  on file- Delegation of [employer] Log Date of visit:    01-Mar-2020      1. Enrollment:   1.1 Screening Date   01-Mar-2020     DD/MM/YYYY  1.2 Sex Male []     Male   [x]   1.3    Age at screening  _49___ years  Was Subject randomized:   [x]  Yes  []  No        If No, select reason:   []  Not eligible as per inclusion/exclusion criteria     []  Withdrawal by subject     []   Withdrawal by parent / guardian     []  Adverse event      []  Lost to follow-up     []  Death     []  Other: ___________________    2.  Randomization:   2.1 Randomization Date 01-Mar-2020  2.2 Randomization Number _[id number]__  2.3 Stratification 1     [x]   Background oral P2Y12 receptor antagonist: None    []   Background oral P2Y12 receptor antagonist: Clopidogrel    []   Background oral P2Y12 receptor antagonist: Prasugrel    []   Background oral P2Y12 receptor antagonist: Ticagrelor  3. Screening Failure:  3.1  Screen Failure Date  DD/MM/YYYY  4. Discontinuation   4.1   Subject's discontinuation date  DD/MM/YYYY    Form based on [employer] [id number],protocol  Version 4.0  page 56 - 09 May 2019 And SOS-AMI_CRFversion 5.0_27Oct2021 pages 1 and 17.

## 2020-03-02 NOTE — Telephone Encounter (Signed)
"  call cannot be completed at this time" Will try again tomorrow.

## 2020-03-02 NOTE — Research (Signed)
[id number], [id number]  Subject ID: [id number] Trainer's name: Eulogio Ditch, RN, BSN  Trainer's signature:  on file- Delegation of Authority Log Date of visit:    01-Mar-2020  Patient Training & Self-injection of Placebo  1. Was the training delivered?  []   No  [x]   Yes      1.1  Date of training          01-Mar-2020      1.2  Start time       _1030________ (24 hour clock)      1.3  Stop time       _1130________ (24 hour clock)       2.  Did the placebo self-injection occur?     []   No  []   Yes      2.1 Autoinjector ID / Label    _________________________       Difficulties in using the autoinjector for the placebo self-injection    3.  Were there any difficulties in performing the placebo self-injection [x]   No  []   Yes  Difficulties with Step 1: Choose an injection site     3.1.1  Was the injection site as defined in the protocol (abdomen / thigh)?  []   No  [x]   Yes     3.1.2. Was the injection done on bare skin?  []   No  [x]   Yes     3.1.3  Did the subject report any other difficulties choosing                      injection site ?  [x]   No  []   Yes, please specify _____________________  Difficulties with Step 2: Twist cap off     3.2.1  Did the subject twist the cap to remove it?  []   No  [x]   Yes    3.2.2  Did the subject twist the cap counterclockwise? []   No  [x]   Yes    3.2.3  Did the force/ torque applied sufficient to twist the cap off?   []   No  [x]   Yes    3.2.4  Did the subject report any other difficulties in twisting                the cap off?  [x]   No  []   Yes, please specify ________________________________  Difficulties with Step 3: Pinch skin and place the autoinjector    3.3.1  Did the subject pinch skin at injection site?  []   No  [x]   Yes    3.3.2  Did the subject place the autoinjector perpendicularly to the skin? []   No  [x]   Yes    3.3.3  Did the subject report any other difficulties pinching the skin                 and placing the autoinjector?    [x]   No  []   Yes, please specify ____________________  Difficulties with Step 4: Firmly push down and hold for 3 seconds    3.4.1  Did the subject attempt to inject with the autoinjector in the right                position, I.e. needle end down?   []   No  [x]   Yes    3.4.2  Did the subject push down firmly until it clicked? []   No  [x]   Yes    3.4.3  Did the subject hold the autoinjector for about 3 seconds                 or until the viewing window turned orange?  []   No  [x]   Yes    3.4.4  Did the subject report any other difficulties pushing                 firmly down ?  [x]   No  []   Yes, Please specify __________________________    INITIAL PATIENT TRAINING  Q1, Did someone (e.g. caregiver, family member) attend the training session             together with the patient?    []  No  [x]   Yes        If yes, please note name phone number and address of other person:                Wife- Keeton Kassebaum   [phone]  Address in EPIC__  Q2. Was the following information provided to the subject?             [x]   Heart attack symptoms   [x]   How to act (inject and follow-up actions to be taken)  [x]   Use of the demo device, including label instructions and IFU  [x]   How to perform the placebo self-injection   Q3  Did the subject correctly reply to the wrap-up questions?   A.  What are common heart attack symptoms?    []   No  [x]   Yes             B.  What has to be done in case any of those symptoms occurs?  []  No  [x]  Yes       Make note of any misconceptions and clarifications given: ___the autoinjector can ONLY be used on the subject and not on any other person that is having MI symptoms. _____________________________________________________________________  Q4  Where will the subject keep/store the study autoinjectors? __on his person and wife will keep one in her purse, they are aware of all environmental concerns regarding hot and cold  conditions.             Form based on [employer] SOS-AMI_CRF Version 5.0 - 27OCT2021 PGS 6, 7, 51

## 2020-03-03 ENCOUNTER — Telehealth: Payer: Self-pay

## 2020-03-03 NOTE — Research (Addendum)
[id number],  [id number]   Subject ID: [id number] Trainer's name: Eulogio Ditch, RN, BSN,  Trainer's signature:  on file- Delegation of Authority Log Date of visit:    01-Mar-2020                          SOS-AMI   VISIT 1   1. Were body weight and/or height asessed?   []  No   [x]   Yes  2. Date  _11-Jan-2022  3. Height    _77_______          4.  Units     []   cm    [x]   inches  5. Height (cm calculated)  inches x 2.54 195.58___  cm  6. Weight   ___251_____          7.  Units     []   kg     [x]   lbs  8. Weight (kg calculated)    weight lbs x 0.4536   __114____ kg  9. BMI (kg/m2)  __29.80  _______   Physical examination  1. Was the physical examination performed?     []   No  [x]   Yes  2.  Date     _01-JAN-2022_________    DD- MMM-YYYY  Medical History  1 Any clinically significant past and/or concomitant diseases or past procedures?  []  No  []  Yes     Main disease or procedure        Start date         End date      Ongoing at          Informed consent ?  Cardiac catheterization  09-2007 09-2007 no  Cardiac  Catheterization  10-2007 10-2007 no                 . Add more lines as needed.   Most recent LVEF & Procedures Qualifying AMI  1. Most recent LVEF      1.1  Assessment date    __11-JAN-2022__  DD-MMM-YYYY       1.2  Left Ventricular Ejection Fraction (%) []   < 35   []   35-50  [x]   >50  2.  Procedures Qualifying AMI      2.1  Coronary- angiography   []   No  [x]   Yes      2.2  Percutaneous coronary intervention []   No  [x]   Yes      2.3  Coronary Artery Bypass Grafting  [x]   No  []   Yes      2.4  Stent insertion    []   No  [x]   Yes              2.4.1.   BMS?       [x]  No  []   yes, Number of BMS  ___________              2.4.2.  DES?        []   No  [x]   yes, Number of DES  ____2_______   2.4.3 BVS?      [x]   No  []   yes, Number of BVS  ___________  Vital Signs  1. Were vital signs collected?  []   No   [x]   Yes  1.1  Date       _12-Jan-2022__________  DD-MMM-YYYY  1.2  Time     __0455___ 24 hour clock  1.3  Heart Rate (bpm)  _70_______  1.4  Systolic Blood Pressure (mmHg) __102________             1.5  Diastolic Blood Pressure (mmHg)  __74________  Pregnancy Test  2. Was a pregnancy test performed?  []   No  []   yes  [x]   N/A     2.1  Test Date   ___________ DD- MMM-YYY     Form based on [employer] eCRF Version 5.0 - 15-Dec-2019 pg 10-15

## 2020-03-03 NOTE — Telephone Encounter (Signed)
Transition Care Management Unsuccessful Follow-up Telephone Call  Date of discharge and from where:  03/01/2020, [facility]   Attempts:  2 ndt Attempt  Reason for unsuccessful TCM follow-up call:  Unable to leave message - call placed to # [phone] and the message stated that the call cannot be completed at this time   Patient has follow up appointment scheduled with [name], NP 03/27/2020.

## 2020-03-03 NOTE — Telephone Encounter (Signed)
TOC Attempt #2  "We're sorry. Your call cannot be completed at this time. Please hang up and try again later."

## 2020-03-06 NOTE — Telephone Encounter (Signed)
Called patient, advised patient to call back to discuss questions regarding TOC.  Left call back number.

## 2020-03-10 ENCOUNTER — Other Ambulatory Visit: Payer: Self-pay

## 2020-03-10 ENCOUNTER — Encounter: Payer: Self-pay | Admitting: Physician Assistant

## 2020-03-10 ENCOUNTER — Ambulatory Visit (INDEPENDENT_AMBULATORY_CARE_PROVIDER_SITE_OTHER): Payer: Self-pay | Admitting: Physician Assistant

## 2020-03-10 VITALS — BP 117/77 | HR 77 | Temp 94.4°F | Ht 77.0 in | Wt 256.0 lb

## 2020-03-10 DIAGNOSIS — I2510 Atherosclerotic heart disease of native coronary artery without angina pectoris: Secondary | ICD-10-CM

## 2020-03-10 DIAGNOSIS — I10 Essential (primary) hypertension: Secondary | ICD-10-CM

## 2020-03-10 DIAGNOSIS — E785 Hyperlipidemia, unspecified: Secondary | ICD-10-CM

## 2020-03-10 NOTE — Patient Instructions (Signed)
Medication Instructions:  Your physician recommends that you continue on your current medications as directed. Please refer to the Current Medication list given to you today.  *If you need a refill on your cardiac medications before your next appointment, please call your pharmacy*  Lab Work: Your physician recommends that you return for lab work in March 2022:   Fasting Lipid Panel-DO NOT EAT OR DRINK PAST MIDNIGHT. OKAY TO HAVE WATER.  HEPATIC (Liver) Function Test If you have labs (blood work) drawn today and your tests are completely normal, you will receive your results only by: [name] MyChart Message (if you have MyChart) OR . A paper copy in the mail If you have any lab test that is abnormal or we need to change your treatment, we will call you to review the results.  Testing/Procedures: NONE ordered at this time of appointment   Follow-Up: At [facility], you and your health needs are our priority.  As part of our continuing mission to provide you with exceptional heart care, we have created designated Provider Care Teams.  These Care Teams include your primary Cardiologist (physician) and Advanced Practice Providers (APPs -  Physician Assistants and Nurse Practitioners) who all work together to provide you with the care you need, when you need it.  Your next appointment:   3 month(s)  The format for your next appointment:   In Person  Provider:   Nicki Guadalajara, MD  Other Instructions

## 2020-03-10 NOTE — Progress Notes (Signed)
Cardiology Office Note:    Date:  03/12/2020   ID:  Isaac Goodwin, DOB Apr 12, 1970, MRN [id number]  PCP:  [name], NP  [facility] Cardiologist:  Shelva Majestic, MD  [place] Electrophysiologist:  None   Referring MD: [name], NP   Chief Complaint  Patient presents with  . Follow-up    Seen for Dr. Claiborne Billings    History of Present Illness:    Isaac Goodwin is a 50 y.o. male with a hx of CAD, obstructive sleep apnea, hypertension and hyperlipidemia.  He had multivessel stenting in 2009 in the setting of NSTEMI.  He recently presented to the hospital on 02/27/2021 due to acute inferior STEMI.  Emergent cardiac catheterization revealed a 100% occluded ostial RCA was brisk left-to-right collaterals from both left circumflex and the LAD, 25% OM1, 100% mid left circumflex occlusion was heavy thrombotic lesion, this was treated with resolute Onyx 3.5 x 18 mm DES, 99% OM 3 with heavy thrombus treated with angioplasty, 100% OM 2 related to thrombus treated with resolute Onyx 2.0 x 18 mm DES, 75% distal LAD lesion, LVEDP 35 mmHg, EF 25 to 35%.  Echocardiogram obtained on the following day showed EF 50 to 55%, grade 1 DD mild dilatation of the ascending aorta measuring at 37 mm, no significant valve issue.  High-dose Lipitor was started however patient has a concern for muscle cramps in the past therefore was not taking any cholesterol medication prior to arrival.  PCSK9 inhibitor will need to be considered as outpatient.  Postprocedure he was discharged on aspirin, Brilinta, statin and a beta-blocker.  Of note, patient was enrolled in selatogrel subcu autoinjector trial by Dr. Harrell Gave.   Patient presents today for follow-up.  He will occasionally have a tingling sensation in the chest wall however this is quite transient.  He denies any obvious exertional chest pain.  He does have left-sided flank pain due to previous injury 2 months ago.  He has been compliant with dual antiplatelet  therapy.  We discussed the importance of cholesterol control.  He will need fasting lipid panel and LFT in 6 to 8 weeks.  He has modified his diet greatly.  We also discussed potential possibility of PCSK9 inhibitor if his cholesterol is still not controlled on the next follow-up.  He does mention having that the cardiologist who saw him in the hospital mention the he will need a repeat echocardiogram at some point however since his ejection fraction normalized after cath, I will defer the decision to consider a repeat echo to Dr. Claiborne Billings his primary cardiologist on the next follow-up.   Past Medical History:  Diagnosis Date  . Coronary artery disease   . Hypercholesteremia   . Hypertension   . Non-STEMI (non-ST elevated myocardial infarction) ([facility]) 10/19/07   successful cutting balloon atherotomy/stenting cypher stent 3.0x74m just beyond ostium prox. in the RCA. 10/23/07 cypher stents placed in LAD & CX  . OSA (obstructive sleep apnea)   . Sinus headache   . Sleep apnea     Past Surgical History:  Procedure Laterality Date  . CORONARY STENT PLACEMENT  10/23/07   successful PTCA/stenting of 90% LAD cypher stent 3.0x1637mtaper of 3.4-3.37m47mleft cx obtuse marginal 1 cypher stent 2.5x137m49m. CORONARY STENT PLACEMENT  10/19/07   successful cutting balloon atherotomy/stenting cypher 3.0x137mm74mt beyond the ostium prox. in the RCA  . CORONARY/GRAFT ACUTE MI REVASCULARIZATION N/A 02/28/2020   Procedure: Coronary/Graft Acute MI Revascularization;  Surgeon: VaranLarae Grooms  S, MD;  Location: [facility];  Service: Cardiovascular;  Laterality: N/A;  . LEFT HEART CATH AND CORONARY ANGIOGRAPHY N/A 02/28/2020   Procedure: LEFT HEART CATH AND CORONARY ANGIOGRAPHY;  Surgeon: Jettie Booze, MD;  Location: [facility];  Service: Cardiovascular;  Laterality: N/A;    Current Medications: Current Meds  Medication Sig  . ascorbic acid (VITAMIN C) 500 MG tablet Take 1,000 mg by mouth daily.   [name] aspirin 81 MG tablet Take 1 tablet (81 mg total) by mouth daily.  [name] atorvastatin (LIPITOR) 80 MG tablet Take 1 tablet (80 mg total) by mouth daily.  [name] buPROPion (WELLBUTRIN SR) 150 MG 12 hr tablet TAKE 1 TABLET DAILY FOR ONE WEEK WHILE DECREASING SMOKING. STOP SMOKING AND INCREASE TO 1 TABLET TWICE A DAY FOR 7 WEEKS. (Patient taking differently: Take 150 mg by mouth daily.)  . cholecalciferol (VITAMIN D3) 25 MCG (1000 UNIT) tablet Take 2,000 Units by mouth daily.  . enalapril (VASOTEC) 10 MG tablet Take 1 tablet (10 mg total) by mouth daily.  . furosemide (LASIX) 40 MG tablet Take 1 tablet (40 mg total) by mouth daily.  [name] lidocaine (LIDODERM) 5 % Place 1 patch onto the skin daily as needed (for back pain).  . methocarbamol (ROBAXIN) 500 MG tablet TAKE 2 TABLETS (1,000 MG TOTAL) BY MOUTH EVERY 6 (SIX) HOURS AS NEEDED FOR MUSCLE SPASMS.  . metoprolol succinate (TOPROL-XL) 50 MG 24 hr tablet Take 1 tablet (50 mg total) by mouth daily. Take with or immediately following a meal.  . nitroGLYCERIN (NITROSTAT) 0.4 MG SL tablet Place 1 tablet (0.4 mg total) under the tongue every 5 (five) minutes as needed.  . potassium chloride 20 MEQ TBCR Take 20 mEq by mouth daily.  . ticagrelor (BRILINTA) 90 MG TABS tablet Take 1 tablet (90 mg total) by mouth 2 (two) times daily.  . Vitamin D, Ergocalciferol, (DRISDOL) 1.25 MG (50000 UNIT) CAPS capsule Take 1 capsule (50,000 Units total) by mouth every 7 (seven) days.     Allergies:   Gabapentin and Penicillins   Social History   Socioeconomic History  . Marital status: Married    Spouse name: Not on file  . Number of children: Not on file  . Years of education: Not on file  . Highest education level: Not on file  Occupational History  . Not on file  Tobacco Use  . Smoking status: Current Some Day Smoker    Packs/day: 0.50    Years: 10.00    Pack years: 5.00    Types: Cigarettes  . Smokeless tobacco: Never Used  Vaping Use  . Vaping Use: Never used   Substance and Sexual Activity  . Alcohol use: Yes    Alcohol/week: 4.0 standard drinks    Types: 4 Glasses of wine per week    Comment: rarely  . Drug use: No  . Sexual activity: Yes  Other Topics Concern  . Not on file  Social History Narrative  . Not on file   Social Determinants of Health   Financial Resource Strain: Not on file  Food Insecurity: Not on file  Transportation Needs: Not on file  Physical Activity: Not on file  Stress: Not on file  Social Connections: Not on file     Family History: The patient's family history includes Diabetes in his mother; Thyroid cancer in his mother.  ROS:   Please see the history of present illness.     All other systems reviewed and  are negative.  EKGs/Labs/Other Studies Reviewed:    The following studies were reviewed today:  Cath 02/28/2020   Ost RCA to Prox RCA lesion is 100% stenosed. Prior stent was occluded. There were brisk left to right collaterals from both the circumflex system and from the LAD.  1st Mrg lesion is 25% stenosed. Stent was patent with mild restenosis.  Mid Cx lesion is 100% stenosed. This was a heavily thrombotic lesion and was the culprit. A drug-eluting stent was successfully placed using a STENT RESOLUTE ONYX 3.5X18, postdilated to greater than 3.8 mm.  Post intervention, there is a 0% residual stenosis.  3rd Mrg lesion is 99% stenosed. This lesion was from embolized thrombus and was treated successfully with angioplasty. Balloon angioplasty was performed using a BALLOON SAPPHIRE 2.5X20.  Post intervention, there is a 0% residual stenosis.  2nd Mrg lesion is 100% stenosed. There was a severe lesion with likely overlying thrombus that embolized from the circumflex and was cleared with balloon angioplasty. Following the balloon, A drug-eluting stent was successfully placed using a STENT RESOLUTE ONYX 2.0X18.  Post intervention, there is a 0% residual stenosis.  Previously placed Mid LAD stent  (unknown type) is widely patent.  Dist LAD-1 lesion is 50% stenosed. Dist LAD-2 lesion is 75% stenosed. The distal LAD was a small vessel which was diffusely diseased and did not reach the apex.  There is moderate left ventricular systolic dysfunction.  LV end diastolic pressure is severely elevated. LVEDP 35 mmHg.  The left ventricular ejection fraction is 25-35% by visual estimate. Inferior and apical hypokinesis.  There is no aortic valve stenosis.  A drug-eluting stent was successfully placed using a STENT RESOLUTE ONYX 3.5X18.  Balloon angioplasty was performed using a BALLOON SAPPHIRE 2.5X20.  A drug-eluting stent was successfully placed using a STENT RESOLUTE ONYX 2.0X18.   Severe multivessel coronary artery disease.  Culprit lesion was the mid circumflex.  His symptoms have been going on for nearly 48 hours intermittently, and the lesion was heavily thrombotic.  Due to the late presentation, there was some embolic phenomenon noted in the distal vessels.  The mid circumflex was successfully revascularized which help restore collateral flow to the RCA.  Severe disease in the distal LAD which is a small vessel.  Watch in ICU.  Dual antiplatelet therapy.  Consider clopidogrel monotherapy after his dual antiplatelet therapy due to diffuse severe disease.  Blood pressure was high by the end of the procedure.  IV nitroglycerin was started.  LVEDP was also high.  We did not order post-cath fluids.  Hopefully, the IV nitroglycerin will help and we will give IV Lasix as well.  Hold ACE inhibitor for now.  Check renal function in a.m.  Given highly thrombotic lesion with emboli to multiple branches, will continue IV tirofiban for 18 hours, as long as there are no bleeding issues from his right radial.  I discussed the results with the patient's father over the phone, and met the patient's wife in person.      Echo 02/29/2020 1. Left ventricular ejection fraction, by estimation, is 50 to  55%. The  left ventricle has low normal function. The left ventricle demonstrates  regional wall motion abnormalities with basal to mid inferolateral  hypokinesis. Left ventricular diastolic  parameters are consistent with Grade I diastolic dysfunction (impaired  relaxation).  2. Right ventricular systolic function is normal. The right ventricular  size is normal. Tricuspid regurgitation signal is inadequate for assessing  PA pressure.  3. The mitral  valve is normal in structure. No evidence of mitral valve  regurgitation. No evidence of mitral stenosis.  4. The aortic valve is tricuspid. Aortic valve regurgitation is not  visualized. Mild aortic valve sclerosis is present, with no evidence of  aortic valve stenosis.  5. Aortic dilatation noted. There is mild dilatation of the ascending  aorta, measuring 37 mm.  6. The inferior vena cava is normal in size with greater than 50%  respiratory variability, suggesting right atrial pressure of 3 mmHg.     EKG:  EKG is ordered today.  The ekg ordered today demonstrates normal sinus rhythm, T wave inversion in the inferior leads.  Minimal ST elevation in V6, however not in adjacent lead  Recent Labs: 01/25/2020: TSH 2.780 02/28/2020: ALT 25 02/29/2020: Hemoglobin 17.4; Platelets 239 03/01/2020: BUN 17; Creatinine, Ser 0.99; Potassium 3.3; Sodium 133  Recent Lipid Panel    Component Value Date/Time   CHOL 256 (H) 02/28/2020 1853   CHOL 235 (H) 01/25/2020 1506   TRIG 64 02/28/2020 1853   HDL 34 (L) 02/28/2020 1853   HDL 36 (L) 01/25/2020 1506   CHOLHDL 7.5 02/28/2020 1853   VLDL 13 02/28/2020 1853   LDLCALC 209 (H) 02/28/2020 1853   LDLCALC 175 (H) 01/25/2020 1506     Risk Assessment/Calculations:       Physical Exam:    VS:  BP 117/77 (BP Location: Right Arm, Patient Position: Sitting, Cuff Size: Normal)   Pulse 77   Temp (!) 94.4 F (34.7 C) Comment: Forehead  Ht '6\' 5"'  (1.956 m)   Wt 256 lb (116.1 kg)   BMI 30.36 kg/m      Wt Readings from Last 3 Encounters:  03/10/20 256 lb (116.1 kg)  03/01/20 251 lb 5.2 oz (114 kg)  01/25/20 262 lb (118.8 kg)     GEN:  Well nourished, well developed in no acute distress HEENT: Normal NECK: No JVD; No carotid bruits LYMPHATICS: No lymphadenopathy CARDIAC: RRR, no murmurs, rubs, gallops RESPIRATORY:  Clear to auscultation without rales, wheezing or rhonchi  ABDOMEN: Soft, non-tender, non-distended MUSCULOSKELETAL:  No edema; No deformity  SKIN: Warm and dry NEUROLOGIC:  Alert and oriented x 3 PSYCHIATRIC:  Normal affect   ASSESSMENT:    1. Coronary artery disease involving native coronary artery of native heart without angina pectoris   2. Dyslipidemia   3. Essential hypertension    PLAN:    In order of problems listed above:  1. CAD: Recently underwent PCI x2 and a balloon angioplasty in the left circumflex territory.  We emphasized the need to be compliant with dual antiplatelet therapy.  2. Hypertension:  Blood pressure stable on current therapy  3. Hyperlipidemia:Continue on Lipitor.  He likely will require a second agent.  We discussed PCSK9 inhibitor.  He will need a fasting lipid panel and LFT in 6 to 8 weeks.  During the meantime, he will help control the cholesterol further through diet and exercise.  If cholesterol is still uncontrolled, we will discussed the initiation of PCSK9 inhibitor again.        Medication Adjustments/Labs and Tests Ordered: Current medicines are reviewed at length with the patient today.  Concerns regarding medicines are outlined above.  Orders Placed This Encounter  Procedures  . Lipid panel  . Hepatic function panel  . EKG 12-Lead   No orders of the defined types were placed in this encounter.   Patient Instructions  Medication Instructions:  Your physician recommends that you continue on your  current medications as directed. Please refer to the Current Medication list given to you today.  *If you need a  refill on your cardiac medications before your next appointment, please call your pharmacy*  Lab Work: Your physician recommends that you return for lab work in March 2022:   Fasting Lipid Panel-DO NOT EAT OR DRINK PAST MIDNIGHT. OKAY TO HAVE WATER.  HEPATIC (Liver) Function Test If you have labs (blood work) drawn today and your tests are completely normal, you will receive your results only by: [name] MyChart Message (if you have MyChart) OR . A paper copy in the mail If you have any lab test that is abnormal or we need to change your treatment, we will call you to review the results.  Testing/Procedures: NONE ordered at this time of appointment   Follow-Up: At [facility], you and your health needs are our priority.  As part of our continuing mission to provide you with exceptional heart care, we have created designated Provider Care Teams.  These Care Teams include your primary Cardiologist (physician) and Advanced Practice Providers (APPs -  Physician Assistants and Nurse Practitioners) who all work together to provide you with the care you need, when you need it.  Your next appointment:   3 month(s)  The format for your next appointment:   In Person  Provider:   Shelva Majestic, MD  Other Instructions      Signed, Almyra Deforest, [place]  03/12/2020 11:47 PM    [place]

## 2020-03-12 ENCOUNTER — Encounter: Payer: Self-pay | Admitting: Physician Assistant

## 2020-03-13 ENCOUNTER — Encounter: Payer: Self-pay | Admitting: *Deleted

## 2020-03-13 DIAGNOSIS — Z006 Encounter for examination for normal comparison and control in clinical research program: Secondary | ICD-10-CM

## 2020-03-13 NOTE — Research (Signed)
[id number], [id number]     FOLLOW-UP PHONE CALLS  SUBJECT ID:  [id number] Trainer's name: Eulogio Ditch, RN, BSN Trainer's signature: on Delegation of Authority Log Date of the Follow up call:           13-Mar-2020  Visit Number: 2 Start time of the Follow up call:  0900       End time of call: 0915  - CONTACT  Q1;  Was the follow up phone call done with the subject?  [x]   YES  []   NO           If NO, check the following:      Q2:    Was the follow up phone call done with a family member               Or caregiver?    []   YES   [x]   NO          Make note about reasons ___________________________________    - MEDICAL CONDITION      Q3:  Did any of the following occur?   []   Death       []   Hospitalization (any cause)    NO     []   Use of autoinjector  Make note about the type of event, and when it occurred. In case of hospitalization: the Location of the hospital and/or the treating physician's contact details  ____________ ____________________________________________________________________ ____________________________________________________________________  Note: remember to report any SAE/AE which has occurred within 30 days after any  injection of the study drug on relevant eCRF forms.   Q4:  Did the subject develop any condition which is an          exclusion criterion?  []   YES  [x]   NO   Take note about the occurrence of any exclusion criterion after randomization: _________________________________________________________________ __________________________________________________________________  Q5: Was there any change in subject's antithrombotic therapy?   []   YES  [x]   NO     Take not about any change of antithrombotic treatment: _______________________ ____________________________________________________________________ ____________________________________________________________________      OF THE STUDY-SPECFIC  TRAINING  Q6: Did the subject correctly reply to the following questions?       A.  What are common heart attack symptoms?      [x]   YES   []   NO      B.  What has to be done in case any of those symptoms occurs? [x]   YES  []   NO      C.  What are the main steps to perform a self-injection?  [x]   YES  []   NO             If NO, then report which step/s was/were missing:        []   Choose injection site (abdomen or thigh)       []   Twist cap off       []   Pinch skin and place the study autoinjector       []   Firmly push down and hold for 3 seconds       D. What has to be done immediately after an injection?  [x]   YES   []   NO          If NO, then report which step/s was/were missing:       []   Call  for emergency medical help       []   Show the autoinjector to the  emergency medical responder       E.  Does the subject recall where s/he keeps/ stores the autoinjectors? [x]  YES  []  NO          Note the place of storage and any corrective explanation if needed below  ____Both patient and wife have autoinjectors with them, aware of storage guidelines_ __________________________________________________________________  - TRAINING REFRESHER   Q7:  Is a training refresher needed?      []  YES  [x]  NO        If YES, indicate items that have to be refreshed. More than one may apply:               []   Heart attack symptoms             []   Actions to be taken following heart attack symptoms             []   Steps to perform the self-injection and follow-up actions to be taken   []   Other, Specify  _________________________________________     Form Based on [employer] [facility]_CRF_Version 5.0_27Oct2021 [id number], [id number],            and eCRF  Version 5.0-  15-Dec-2019

## 2020-03-17 ENCOUNTER — Other Ambulatory Visit: Payer: Self-pay

## 2020-03-17 ENCOUNTER — Ambulatory Visit (HOSPITAL_COMMUNITY)
Admission: RE | Admit: 2020-03-17 | Discharge: 2020-03-17 | Disposition: A | Discharge status: Home / Self Care | Payer: Medicaid Other | Source: Ambulatory Visit | Attending: Nurse Practitioner | Admitting: Nurse Practitioner

## 2020-03-17 ENCOUNTER — Encounter: Payer: Self-pay | Admitting: Nurse Practitioner

## 2020-03-17 ENCOUNTER — Encounter (HOSPITAL_COMMUNITY): Payer: Self-pay | Admitting: Emergency Medicine

## 2020-03-17 ENCOUNTER — Emergency Department (HOSPITAL_COMMUNITY)
Admission: EM | Admit: 2020-03-17 | Discharge: 2020-03-18 | Disposition: A | Discharge status: Home / Self Care | Payer: Medicaid Other | Attending: Emergency Medicine | Admitting: Emergency Medicine

## 2020-03-17 DIAGNOSIS — I2510 Atherosclerotic heart disease of native coronary artery without angina pectoris: Secondary | ICD-10-CM | POA: Insufficient documentation

## 2020-03-17 DIAGNOSIS — I10 Essential (primary) hypertension: Secondary | ICD-10-CM | POA: Diagnosis not present

## 2020-03-17 DIAGNOSIS — R002 Palpitations: Secondary | ICD-10-CM | POA: Diagnosis present

## 2020-03-17 DIAGNOSIS — Z7982 Long term (current) use of aspirin: Secondary | ICD-10-CM | POA: Diagnosis not present

## 2020-03-17 DIAGNOSIS — Z79899 Other long term (current) drug therapy: Secondary | ICD-10-CM | POA: Insufficient documentation

## 2020-03-17 DIAGNOSIS — F17210 Nicotine dependence, cigarettes, uncomplicated: Secondary | ICD-10-CM | POA: Insufficient documentation

## 2020-03-17 DIAGNOSIS — S270XXD Traumatic pneumothorax, subsequent encounter: Secondary | ICD-10-CM | POA: Diagnosis present

## 2020-03-17 DIAGNOSIS — I77810 Thoracic aortic ectasia: Secondary | ICD-10-CM

## 2020-03-17 LAB — BASIC METABOLIC PANEL
Anion gap: 10 (ref 5–15)
BUN: 22 mg/dL — ABNORMAL HIGH (ref 6–20)
CO2: 27 mmol/L (ref 22–32)
Calcium: 9.4 mg/dL (ref 8.9–10.3)
Chloride: 101 mmol/L (ref 98–111)
Creatinine, Ser: 1.2 mg/dL (ref 0.61–1.24)
GFR, Estimated: >60 mL/min (ref >60)
Glucose, Bld: 111 mg/dL — ABNORMAL HIGH (ref 70–99)
Potassium: 3.9 mmol/L (ref 3.5–5.1)
Sodium: 138 mmol/L (ref 135–145)

## 2020-03-17 LAB — TROPONIN I (HIGH SENSITIVITY): Troponin I (High Sensitivity): 16 ng/L (ref <18)

## 2020-03-17 LAB — CBC
HCT: 47 % (ref 39.0–52.0)
Hemoglobin: 16.4 g/dL (ref 13.0–17.0)
MCH: 31.5 pg (ref 26.0–34.0)
MCHC: 34.9 g/dL (ref 30.0–36.0)
MCV: 90.4 fL (ref 80.0–100.0)
Platelets: 329 10*3/uL (ref 150–400)
RBC: 5.2 MIL/uL (ref 4.22–5.81)
RDW: 12.7 % (ref 11.5–15.5)
WBC: 10.1 10*3/uL (ref 4.0–10.5)
nRBC: 0 % (ref 0.0–0.2)

## 2020-03-17 LAB — PROTIME-INR
INR: 1 (ref 0.8–1.2)
Prothrombin Time: 12.7 seconds (ref 11.4–15.2)

## 2020-03-17 LAB — MAGNESIUM: Magnesium: 2.2 mg/dL (ref 1.7–2.4)

## 2020-03-17 MED ORDER — NITROGLYCERIN 2 % TD OINT
0.5000 [in_us] | TOPICAL_OINTMENT | Freq: Once | TRANSDERMAL | Status: AC
  Administered 2020-03-18: 0.5 [in_us] via TOPICAL
  Filled 2020-03-17: qty 1

## 2020-03-17 MED ORDER — ASPIRIN 81 MG PO CHEW
324.0000 mg | CHEWABLE_TABLET | Freq: Once | ORAL | Status: AC
  Administered 2020-03-18: 324 mg via ORAL
  Filled 2020-03-17: qty 4

## 2020-03-17 NOTE — ED Triage Notes (Signed)
Pt presents to ED POV. Pt c/o "funny" feeling in his chest. Pt reports that he had stents placed on 1/10 for MI. Pt reports last time he felt this he was in v tach. No CP

## 2020-03-17 NOTE — ED Notes (Signed)
Dr. Stevie Kern at bedside in triage

## 2020-03-17 NOTE — ED Triage Notes (Signed)
Emergency Medicine Provider Triage Evaluation Note  Isaac Goodwin , a 50 y.o. male  was evaluated in triage.  Pt complains of chest pain.  Slight funny feeling/fluttering sensation in the center of his chest.  1 out of 10 in severity.  Currently not having this symptom.  Felt very different than the crushing left-sided chest pain he had for recent MI.  1/10 left heart cath, multiple lesions identified, received balloon angioplasty and stent placement.  Review of Systems  Positive: Chest discomfort Negative:   Physical Exam  BP 122/85 (BP Location: Left Arm)   Pulse 90   Temp (!) 97.5 F (36.4 C) (Oral)   Resp 19   Ht 6\' 5"  (1.956 m)   Wt 113.4 kg   SpO2 97%   BMI 29.65 kg/m  Gen:   Awake, no distress   HEENT:  Atraumatic  Resp:  Normal effort  Cardiac:  Normal rate  Abd:   Nondistended, nontender  MSK:   Moves extremities without difficulty  Neuro:  Speech clear   Medical Decision Making  Medically screening exam initiated at 10:38 PM.  Appropriate orders placed.  Artur Winningham was informed that the remainder of the evaluation will be completed by another provider, this initial triage assessment does not replace that evaluation, and the importance of remaining in the ED until their evaluation is complete.  Clinical Impression   50 year old male with chest pain, recent MI, LHC with PCI occluding angioplasty and stent placement.  EKG with worsening T wave inversion and some ST segment changes in inferior and lateral leads when compared to EKG on 1/21.  Reviewed EKG and case in detail with on-call STEMI doc, Dr. 2/21.  He states this is within expected range and does not represent acute STEMI.  Recommends trending enzymes.  States his enzymes still may be mildly elevated from recent heart catheterization procedure.  Patient will need room for further work-up and monitoring   Excell Seltzer, MD 03/17/20 2242

## 2020-03-18 ENCOUNTER — Other Ambulatory Visit: Payer: Self-pay | Admitting: Physician Assistant

## 2020-03-18 ENCOUNTER — Emergency Department (HOSPITAL_COMMUNITY): Payer: Medicaid Other

## 2020-03-18 ENCOUNTER — Encounter (HOSPITAL_COMMUNITY): Payer: Self-pay | Admitting: Emergency Medicine

## 2020-03-18 ENCOUNTER — Encounter: Payer: Self-pay | Admitting: Physician Assistant

## 2020-03-18 ENCOUNTER — Encounter: Payer: Self-pay | Admitting: *Deleted

## 2020-03-18 DIAGNOSIS — I472 Ventricular tachycardia, unspecified: Secondary | ICD-10-CM

## 2020-03-18 DIAGNOSIS — R002 Palpitations: Secondary | ICD-10-CM

## 2020-03-18 DIAGNOSIS — R079 Chest pain, unspecified: Secondary | ICD-10-CM

## 2020-03-18 DIAGNOSIS — Z006 Encounter for examination for normal comparison and control in clinical research program: Secondary | ICD-10-CM

## 2020-03-18 LAB — TROPONIN I (HIGH SENSITIVITY): Troponin I (High Sensitivity): 17 ng/L (ref <18)

## 2020-03-18 MED ORDER — IOHEXOL 350 MG/ML SOLN
100.0000 mL | Freq: Once | INTRAVENOUS | Status: AC | PRN
  Administered 2020-03-18: 100 mL via INTRAVENOUS
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
## 2020-03-18 NOTE — ED Notes (Signed)
Pt reports that he is no longer having discomfort to the left side of chest and is able to take a deep breath without pain.

## 2020-03-18 NOTE — Progress Notes (Signed)
See consult note for fellow event monitor request

## 2020-03-18 NOTE — Discharge Instructions (Signed)
Increase metoprolol XL to 75 mg every day change lasix to as needed for swelling or weight gain > 2 lbs in 2 days of > 5 pounds in 5 days

## 2020-03-18 NOTE — Progress Notes (Signed)
   Received sign-out from overnight fellow that patient was seen in ED overnight with palpitations and discharged. He requested event monitor and 1 month follow-up. I sent message to office to help arrange. Placed event monitor order under Dr. [name] so will route to him just as FYI, no further action needed at this time. Lidya Mccalister PA-C

## 2020-03-20 ENCOUNTER — Ambulatory Visit (INDEPENDENT_AMBULATORY_CARE_PROVIDER_SITE_OTHER): Payer: Self-pay

## 2020-03-20 ENCOUNTER — Ambulatory Visit: Payer: Self-pay | Admitting: Pharmacist

## 2020-03-20 DIAGNOSIS — I472 Ventricular tachycardia, unspecified: Secondary | ICD-10-CM

## 2020-03-20 DIAGNOSIS — R002 Palpitations: Secondary | ICD-10-CM

## 2020-03-21 ENCOUNTER — Encounter (HOSPITAL_COMMUNITY): Payer: Self-pay

## 2020-03-21 ENCOUNTER — Telehealth (HOSPITAL_COMMUNITY): Payer: Self-pay

## 2020-03-21 ENCOUNTER — Other Ambulatory Visit (HOSPITAL_COMMUNITY): Payer: Self-pay

## 2020-03-21 MED FILL — VIT D2 1.25 MG (50,000 UNIT: 1.25 MG | 84 days supply | Qty: 12 | Fill #0

## 2020-03-21 NOTE — Telephone Encounter (Signed)
Pt insurance is pending with medicaid and pt doesn't want to do virtual cardiac rehab if he cant do the full program. Pt is no longer interested. Closed referral.

## 2020-03-21 NOTE — Telephone Encounter (Signed)
Attempted to call patient in regards to Cardiac Rehab - LM on VM Mailed letter 

## 2020-03-24 DIAGNOSIS — R002 Palpitations: Secondary | ICD-10-CM

## 2020-03-24 DIAGNOSIS — I472 Ventricular tachycardia: Secondary | ICD-10-CM

## 2020-03-27 ENCOUNTER — Other Ambulatory Visit: Payer: Self-pay | Admitting: Nurse Practitioner

## 2020-03-27 ENCOUNTER — Ambulatory Visit: Payer: Self-pay | Attending: Nurse Practitioner | Admitting: Nurse Practitioner

## 2020-03-27 DIAGNOSIS — M4722 Other spondylosis with radiculopathy, cervical region: Secondary | ICD-10-CM

## 2020-03-27 DIAGNOSIS — I214 Non-ST elevation (NSTEMI) myocardial infarction: Secondary | ICD-10-CM

## 2020-03-27 DIAGNOSIS — I714 Abdominal aortic aneurysm, without rupture, unspecified: Secondary | ICD-10-CM

## 2020-03-27 DIAGNOSIS — I10 Essential (primary) hypertension: Secondary | ICD-10-CM

## 2020-03-27 DIAGNOSIS — F17200 Nicotine dependence, unspecified, uncomplicated: Secondary | ICD-10-CM

## 2020-03-27 MED ORDER — METHOCARBAMOL 500 MG PO TABS
1000.0000 mg | ORAL_TABLET | Freq: Four times a day (QID) | ORAL | 0 refills | Status: DC | PRN
Start: 2020-03-27 — End: 2020-04-27

## 2020-03-27 MED ORDER — BUPROPION HCL ER (SR) 150 MG PO TB12: ORAL_TABLET | ORAL | 0 refills | Status: DC

## 2020-03-27 MED ORDER — ATORVASTATIN CALCIUM 80 MG PO TABS: 80.0000 mg | ORAL_TABLET | Freq: Every day | ORAL | 1 refills | Status: DC

## 2020-03-27 MED ORDER — TICAGRELOR 90 MG PO TABS: 90.0000 mg | ORAL_TABLET | Freq: Two times a day (BID) | ORAL | 1 refills | Status: DC

## 2020-03-27 MED ORDER — METOPROLOL SUCCINATE ER 50 MG PO TB24: 50.0000 mg | ORAL_TABLET | Freq: Every day | ORAL | 0 refills | Status: DC

## 2020-03-27 MED ORDER — ENALAPRIL MALEATE 10 MG PO TABS
10.0000 mg | ORAL_TABLET | Freq: Every day | ORAL | 1 refills | Status: DC
Start: 2020-03-27 — End: 2020-03-27

## 2020-03-27 MED FILL — BRILINTA 90 MG TABLET: 90 | 30 days supply | Qty: 60 | Fill #0

## 2020-03-27 MED FILL — ATORVASTATIN CALCIUM 80 MG: 80 | 30 days supply | Qty: 30 | Fill #0

## 2020-03-27 MED FILL — BUPROPION SR 150 MG TABLET: 150 | 30 days supply | Qty: 67 | Fill #0

## 2020-03-27 MED FILL — METHOCARBAMOL 500 MG TABS: 500 | 11 days supply | Qty: 90 | Fill #0

## 2020-03-27 MED FILL — ENALAPRIL MALEATE 10 MG TAB: 10 | 30 days supply | Qty: 30 | Fill #0

## 2020-03-27 MED FILL — METOPROLOL SUCCINATE ER 50: 50 | 30 days supply | Qty: 30 | Fill #0

## 2020-03-27 NOTE — Progress Notes (Unsigned)
Virtual Visit via Telephone Note Due to national recommendations of social distancing due to COVID 19, telehealth visit is felt to be most appropriate for this patient at this time.  I discussed the limitations, risks, security and privacy concerns of performing an evaluation and management service by telephone and the availability of in person appointments. I also discussed with the patient that there may be a patient responsible charge related to this service. The patient expressed understanding and agreed to proceed.    I connected with Isaac Goodwin on 03/27/20  at   9:30 AM EST  EDT by telephone and verified that I am speaking with the correct person using two identifiers.   Consent I discussed the limitations, risks, security and privacy concerns of performing an evaluation and management service by telephone and the availability of in person appointments. I also discussed with the patient that there may be a patient responsible charge related to this service. The patient expressed understanding and agreed to proceed.   Location of Patient: Private Residence   Location of Provider: [facility]    Persons participating in Telemedicine visit: [name] YY [place] CMA Xane Amsden    History of Present Illness: Telemedicine visit for: Hospital Follow up She has a past medical history of Coronary artery disease, Hypercholesteremia, Hypertension, Non-STEMI ((10/19/07), CAD with Stenting (02-2020) OSA, Sinus headache, and Obstructive Sleep apnea.  He is concerned about his AAA (4.4cm).  Although I have instructed him that AAA <5cm does not require surgical intervention however he would like to be referred to vascular surgery at this time for further evaluation. States he is afraid to lift heavy objects or perform strenous activity. I have given him instructions regarding any limitations (no lifting over 25lbs).  He does continue to smoke. Given  wellbutrin for smoking cessation. Currently taking ASA, Brilinta and BB. He is scheduled to see Cardiology in May.   Essential Hypertension He is not monitoring his blood pressure at home. Taking enalapril 10 mg daily, toprol XL 50 m daily and furosemide 040 mg daily. Denies chest pain, shortness of breath, palpitations, lightheadedness, dizziness, headaches or BLE edema.  BP Readings from Last 3 Encounters:  03/18/20 (!) 87/76  03/10/20 117/77  03/01/20 104/71    Past Medical History:  Diagnosis Date  . Coronary artery disease   . Hypercholesteremia   . Hypertension   . Non-STEMI (non-ST elevated myocardial infarction) (HCC) 10/19/07   successful cutting balloon atherotomy/stenting cypher stent 3.0x70mm just beyond ostium prox. in the RCA. 10/23/07 cypher stents placed in LAD & CX  . OSA (obstructive sleep apnea)   . Sinus headache   . Sleep apnea     Past Surgical History:  Procedure Laterality Date  . CORONARY STENT PLACEMENT  10/23/07   successful PTCA/stenting of 90% LAD cypher stent 3.0x57mm taper of 3.4-3.61mm, left cx obtuse marginal 1 cypher stent 2.5x73mm   . CORONARY STENT PLACEMENT  10/19/07   successful cutting balloon atherotomy/stenting cypher 3.0x30mm just beyond the ostium prox. in the RCA  . CORONARY/GRAFT ACUTE MI REVASCULARIZATION N/A 02/28/2020   Procedure: Coronary/Graft Acute MI Revascularization;  Surgeon: Corky Crafts, MD;  Location: [facility] INVASIVE CV LAB;  Service: Cardiovascular;  Laterality: N/A;  . LEFT HEART CATH AND CORONARY ANGIOGRAPHY N/A 02/28/2020   Procedure: LEFT HEART CATH AND CORONARY ANGIOGRAPHY;  Surgeon: Corky Crafts, MD;  Location: [facility] INVASIVE CV LAB;  Service: Cardiovascular;  Laterality: N/A;    Family History  Problem Relation  Age of Onset  . Diabetes Mother   . Thyroid cancer Mother     Social History   Socioeconomic History  . Marital status: Married    Spouse name: Not on file  . Number of children: Not on file  . Years  of education: Not on file  . Highest education level: Not on file  Occupational History  . Not on file  Tobacco Use  . Smoking status: Current Some Day Smoker    Packs/day: 0.50    Years: 10.00    Pack years: 5.00    Types: Cigarettes  . Smokeless tobacco: Never Used  Vaping Use  . Vaping Use: Never used  Substance and Sexual Activity  . Alcohol use: Yes    Alcohol/week: 4.0 standard drinks    Types: 4 Glasses of wine per week    Comment: rarely  . Drug use: No  . Sexual activity: Yes  Other Topics Concern  . Not on file  Social History Narrative  . Not on file   Social Determinants of Health   Financial Resource Strain: Not on file  Food Insecurity: Not on file  Transportation Needs: Not on file  Physical Activity: Not on file  Stress: Not on file  Social Connections: Not on file     Observations/Objective: Awake, alert and oriented x 3   Review of Systems  Constitutional: Negative for fever, malaise/fatigue and weight loss.  HENT: Negative.  Negative for nosebleeds.   Eyes: Negative.  Negative for blurred vision, double vision and photophobia.  Respiratory: Negative for cough, shortness of breath and wheezing.   Cardiovascular: Negative.  Negative for chest pain, palpitations and leg swelling.  Gastrointestinal: Negative.  Negative for heartburn, nausea and vomiting.  Musculoskeletal: Positive for back pain. Negative for myalgias.  Neurological: Negative.  Negative for dizziness, focal weakness, seizures and headaches.  Psychiatric/Behavioral: Negative.  Negative for suicidal ideas.    Assessment and Plan: Isaac Goodwin was seen today for follow-up.  Diagnoses and all orders for this visit:  Abdominal aortic aneurysm (AAA) without rupture (HCC) -     Ambulatory referral to Vascular Surgery -     atorvastatin (LIPITOR) 80 MG tablet; Take 1 tablet (80 mg total) by mouth daily.  Essential hypertension -     metoprolol succinate (TOPROL-XL) 50 MG 24 hr tablet; Take  1 tablet (50 mg total) by mouth daily. Take with or immediately following a meal. -     enalapril (VASOTEC) 10 MG tablet; Take 1 tablet (10 mg total) by mouth daily. Continue all antihypertensives as prescribed.  Remember to bring in your blood pressure log with you for your follow up appointment.  DASH/Mediterranean Diets are healthier choices for HTN.   Osteoarthritis of spine with radiculopathy, cervical region -     methocarbamol (ROBAXIN) 500 MG tablet; Take 2 tablets (1,000 mg total) by mouth every 6 (six) hours as needed for muscle spasms.  Tobacco dependence -     buPROPion (WELLBUTRIN SR) 150 MG 12 hr tablet; TAKE 1 TABLET DAILY FOR ONE WEEK WHILE DECREASING SMOKING. STOP SMOKING AND INCREASE TO 1 TABLET TWICE A DAY FOR 7 WEEKS.  Acute non-ST segment elevation myocardial infarction (HCC) -     ticagrelor (BRILINTA) 90 MG TABS tablet; Take 1 tablet (90 mg total) by mouth 2 (two) times daily. -     atorvastatin (LIPITOR) 80 MG tablet; Take 1 tablet (80 mg total) by mouth daily. INSTRUCTIONS: Work on a low fat, heart healthy diet and participate  in regular aerobic exercise program by working out at least 150 minutes per week; 5 days a week-30 minutes per day. Avoid red meat/beef/steak,  fried foods. junk foods, sodas, sugary drinks, unhealthy snacking, alcohol and smoking.  Drink at least 80 oz of water per day and monitor your carbohydrate intake daily.     Follow Up Instructions Return in about 3 months (around 06/24/2020).     I discussed the assessment and treatment plan with the patient. The patient was provided an opportunity to ask questions and all were answered. The patient agreed with the plan and demonstrated an understanding of the instructions.   The patient was advised to call back or seek an in-person evaluation if the symptoms worsen or if the condition fails to improve as anticipated.  I provided 17 minutes of non-face-to-face time during this encounter including median  intraservice time, reviewing previous notes, labs, imaging, medications and explaining diagnosis and management.  Claiborne Rigg, FNP-BC
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
## 2020-03-30 ENCOUNTER — Encounter: Payer: Self-pay | Admitting: Nurse Practitioner

## 2020-03-30 ENCOUNTER — Encounter: Payer: Self-pay | Admitting: *Deleted

## 2020-03-30 DIAGNOSIS — Z006 Encounter for examination for normal comparison and control in clinical research program: Secondary | ICD-10-CM
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
## 2020-04-04 ENCOUNTER — Other Ambulatory Visit: Payer: Self-pay

## 2020-04-04 ENCOUNTER — Ambulatory Visit: Payer: Self-pay | Attending: Nurse Practitioner

## 2020-04-05 ENCOUNTER — Encounter: Payer: Self-pay | Admitting: *Deleted

## 2020-04-05 DIAGNOSIS — Z006 Encounter for examination for normal comparison and control in clinical research program: Secondary | ICD-10-CM

## 2020-04-05 NOTE — Research (Signed)
[id number], [id number]     FOLLOW-UP PHONE CALLS  SUBJECT ID:  [id number] Trainer's name: Eulogio Ditch, RN, BSN Trainer's signature: on Delegation of Authority Log  Date of the Follow up call:           04-05-2020  The patient had called [place] on 03-30-2020 to report that one of his autoinjectors was left outside in his car overnight on 03-29-2020. It was wrapped up in his coat but temperatures were down in the mid 30s that night. After receiving direction from Marquis Lunch Senior QA Manager [employer] today, I have asked Mr. Curci to come in to have this autoinjector exchanged. Mr Jolliff said that his schedule is very busy now and he will call me back to set up a time for replacement. I discussed all these Visit 3 questions with him. I encouraged him to call back asap due to the possibility of this injector being ineffective. He voiced understanding on all this information.    Visit Number: 3 Start time of the Follow up call:  11:45 am       End time of call: 11:55am   - CONTACT  Q1;  Was the follow up phone call done with the subject?  [x]   YES  []   NO           If NO, check the following:      Q2:    Was the follow up phone call done with a family member               Or caregiver?    []   YES   [x]   NO          Make note about reasons __wife is at work_______________    - MEDICAL CONDITION      Q3:  Did any of the following occur?   []   Death       []   Hospitalization (any cause)    NO      []   Use of autoinjector  Make note about the type of event, and when it occurred. In case of hospitalization: the Location of the hospital and/or the treating physician's contact details  ____________ ____________________________________________________________________ ____________________________________________________________________  Note: remember to report any SAE/AE which has occurred within 30 days after any  injection of the study drug on  relevant eCRF forms.   Q4:  Did the subject develop any condition which is an          exclusion criterion?  []   YES  [x]   NO   Take note about the occurrence of any exclusion criterion after randomization: _________________________________________________________________ __________________________________________________________________  Q5: Was there any change in subject's antithrombotic therapy?   []   YES  [x]   NO     Take not about any change of antithrombotic treatment: _______________________ ____________________________________________________________________ ____________________________________________________________________      OF THE STUDY-SPECFIC TRAINING  Q6: Did the subject correctly reply to the following questions?       A.  What are common heart attack symptoms?      [x]   YES   []   NO      B.  What has to be done in case any of those symptoms occurs? [x]   YES  []   NO      C.  What are the main steps to perform a self-injection?  [x]   YES  []   NO             If NO,  then report which step/s was/were missing:        []   Choose injection site (abdomen or thigh)       []   Twist cap off       []   Pinch skin and place the study autoinjector       []   Firmly push down and hold for 3 seconds       D. What has to be done immediately after an injection?  [x]   YES   []   NO          If NO, then report which step/s was/were missing:       []   Call  for emergency medical help       []   Show the autoinjector to the emergency medical responder       E.  Does the subject recall where s/he keeps/ stores the autoinjectors? []  YES  []  NO          Note the place of storage and any corrective explanation if needed below __________________________________________________________________ __________________________________________________________________  - TRAINING REFRESHER   Q7:  Is a training refresher needed?      []  YES  [x]  NO        If YES, indicate items  that have to be refreshed. More than one may apply:               []   Heart attack symptoms             []   Actions to be taken following heart attack symptoms             []   Steps to perform the self-injection and follow-up actions to be taken   []   Other, Specify  _________________________________________     Form Based on [employer] SOS-AMI_CRF_Version 5.0_27Oct2021 [id number],            and eCRF  Version 5.0-  15-Dec-2019

## 2020-04-07 ENCOUNTER — Encounter: Payer: Self-pay | Admitting: *Deleted

## 2020-04-07 DIAGNOSIS — Z006 Encounter for examination for normal comparison and control in clinical research program: Secondary | ICD-10-CM

## 2020-04-12 ENCOUNTER — Encounter: Payer: Self-pay | Admitting: *Deleted

## 2020-04-12 DIAGNOSIS — Z006 Encounter for examination for normal comparison and control in clinical research program: Secondary | ICD-10-CM

## 2020-04-12 NOTE — Research (Signed)
I had reached out numerous times to this patient to come in for autoinjector exchange and he finally called me back today. He states that he realizes that he has only 1 injector to use currently but he can not come in to get the new one until next week, Thursday 04-20-20. He has an appt at [facility] to have some blood work done and will come by [employer] office afterwards. Again, I stressed the importance of getting this autoinjector replaced and he again voiced understanding. I have emailed him directions to our [employer].

## 2020-04-12 NOTE — Research (Signed)
I talked to Isaac Goodwin about switching out his SOS-AMI autoinjector and he said he will call me back asap to sett this up. He understands fully that he only has 1 autoinjector to use at this time.

## 2020-04-13 ENCOUNTER — Other Ambulatory Visit: Payer: Self-pay | Admitting: Cardiovascular Disease

## 2020-04-13 DIAGNOSIS — I10 Essential (primary) hypertension: Secondary | ICD-10-CM

## 2020-04-20 ENCOUNTER — Encounter: Payer: Self-pay | Admitting: *Deleted

## 2020-04-20 DIAGNOSIS — Z006 Encounter for examination for normal comparison and control in clinical research program: Secondary | ICD-10-CM

## 2020-04-20 NOTE — Research (Signed)
I spoke to Isaac Goodwin this morning and he says he is coming either today or tomorrow to switch out his autoinjector. He now says that he is waiting to get his daughter's COVID test back, she has been in contact with a person who has it. I again cautioned him about only having 1 autoinjector. I have notified the [name] from [place] and also Charlett Nose, PharmD of this.

## 2020-04-21 ENCOUNTER — Other Ambulatory Visit: Payer: Self-pay | Admitting: Physician Assistant

## 2020-04-24 ENCOUNTER — Encounter: Payer: Self-pay | Admitting: *Deleted

## 2020-04-24 DIAGNOSIS — Z006 Encounter for examination for normal comparison and control in clinical research program: Secondary | ICD-10-CM
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
## 2020-04-25 MED FILL — $BRILINTA 90 MG TABLET: 90 | 90 days supply | Qty: 180 | Fill #1

## 2020-04-26 ENCOUNTER — Other Ambulatory Visit: Payer: Self-pay

## 2020-04-26 ENCOUNTER — Other Ambulatory Visit: Payer: Self-pay | Admitting: Nurse Practitioner

## 2020-04-26 DIAGNOSIS — M4722 Other spondylosis with radiculopathy, cervical region: Secondary | ICD-10-CM

## 2020-04-26 DIAGNOSIS — E785 Hyperlipidemia, unspecified: Secondary | ICD-10-CM

## 2020-04-26 LAB — LIPID PANEL
Chol/HDL Ratio: 4.5 ratio (ref 0.0–5.0)
Cholesterol, Total: 141 mg/dL (ref 100–199)
HDL: 31 mg/dL — ABNORMAL LOW (ref >39)
LDL Chol Calc (NIH): 88 mg/dL (ref 0–99)
Triglycerides: 123 mg/dL (ref 0–149)
VLDL Cholesterol Cal: 22 mg/dL (ref 5–40)

## 2020-04-26 LAB — HEPATIC FUNCTION PANEL
ALT: 36 IU/L (ref 0–44)
AST: 22 IU/L (ref 0–40)
Albumin: 4.4 g/dL (ref 3.8–4.9)
Alkaline Phosphatase: 78 IU/L (ref 44–121)
Bilirubin Total: 0.5 mg/dL (ref 0.0–1.2)
Bilirubin, Direct: 0.15 mg/dL (ref 0.00–0.40)
Total Protein: 6.7 g/dL (ref 6.0–8.5)

## 2020-04-26 MED FILL — METOPROLOL SUCCINATE ER 50: 50 | 30 days supply | Qty: 30 | Fill #1

## 2020-04-26 MED FILL — ATORVASTATIN CALCIUM 80 MG: 80 | 30 days supply | Qty: 30 | Fill #1

## 2020-04-26 MED FILL — ENALAPRIL MALEATE 10 MG TAB: 10 | 30 days supply | Qty: 30 | Fill #1

## 2020-04-26 NOTE — Telephone Encounter (Signed)
Requested medication (s) are due for refill today: yes  Requested medication (s) are on the active medication list: yes  Last refill:  03/27/2020  future visit scheduled: yes  Notes to clinic: this refill cannot be delegated    Requested Prescriptions  Pending Prescriptions Disp Refills   methocarbamol (ROBAXIN) 500 MG tablet [Pharmacy Med Name: METHOCARBAMOL 500 MG TABS 500 Tablet] 90 tablet 0    Sig: Take 2 tablets (1,000 mg total) by mouth every 6 (six) hours as needed for muscle spasms.      Not Delegated - Analgesics:  Muscle Relaxants Failed - 04/26/2020 10:51 AM      Failed - This refill cannot be delegated      Passed - Valid encounter within last 6 months    Recent Outpatient Visits           1 month ago Abdominal aortic aneurysm (AAA) without rupture [facility])   [place] [facility] [place], Shea Stakes, NP   2 months ago Essential hypertension   [facility] Lois Huxley, Cornelius Moras, RPH-CPP   3 months ago Encounter to establish care   [facility] Claiborne Rigg, NP       Future Appointments             In 1 week Claiborne Rigg, NP [facility]   In 1 month [name], Clovis Pu, MD [facility] [place], [facility]

## 2020-04-27 ENCOUNTER — Other Ambulatory Visit: Payer: Self-pay | Admitting: Family Medicine

## 2020-04-27 MED FILL — METHOCARBAMOL 500 MG TABS: 500 | 11 days supply | Qty: 90 | Fill #0

## 2020-05-02 ENCOUNTER — Other Ambulatory Visit: Payer: Self-pay

## 2020-05-02 DIAGNOSIS — E785 Hyperlipidemia, unspecified: Secondary | ICD-10-CM

## 2020-05-02 DIAGNOSIS — Z79899 Other long term (current) drug therapy: Secondary | ICD-10-CM

## 2020-05-02 MED ORDER — EZETIMIBE 10 MG PO TABS: 10.0000 mg | ORAL_TABLET | Freq: Every day | ORAL | 0 refills | Status: DC

## 2020-05-03 ENCOUNTER — Other Ambulatory Visit: Payer: Self-pay

## 2020-05-03 ENCOUNTER — Encounter: Payer: Self-pay | Admitting: Surgery

## 2020-05-03 ENCOUNTER — Institutional Professional Consult (permissible substitution) (INDEPENDENT_AMBULATORY_CARE_PROVIDER_SITE_OTHER): Payer: Self-pay | Admitting: Surgery

## 2020-05-03 VITALS — BP 115/86 | HR 72 | Resp 20 | Ht 77.0 in | Wt 260.0 lb

## 2020-05-03 DIAGNOSIS — I7121 Aneurysm of the ascending aorta, without rupture: Secondary | ICD-10-CM

## 2020-05-03 DIAGNOSIS — I712 Thoracic aortic aneurysm, without rupture: Secondary | ICD-10-CM
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
## 2020-05-05 ENCOUNTER — Encounter: Payer: Self-pay | Admitting: Nurse Practitioner

## 2020-05-16 ENCOUNTER — Other Ambulatory Visit: Payer: Self-pay

## 2020-05-16 DIAGNOSIS — Z79899 Other long term (current) drug therapy: Secondary | ICD-10-CM

## 2020-05-16 DIAGNOSIS — E785 Hyperlipidemia, unspecified: Secondary | ICD-10-CM

## 2020-05-20 ENCOUNTER — Other Ambulatory Visit: Payer: Self-pay

## 2020-05-29 ENCOUNTER — Encounter: Payer: Self-pay | Admitting: *Deleted

## 2020-05-29 DIAGNOSIS — Z006 Encounter for examination for normal comparison and control in clinical research program: Secondary | ICD-10-CM
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
## 2020-05-30 ENCOUNTER — Ambulatory Visit: Payer: Self-pay | Admitting: Cardiovascular Disease

## 2020-05-31 ENCOUNTER — Other Ambulatory Visit: Payer: Self-pay

## 2020-05-31 MED FILL — Enalapril Maleate Tab 10 MG: ORAL | 30 days supply | Qty: 30 | Fill #0 | Status: AC

## 2020-05-31 MED FILL — Metoprolol Succinate Tab ER 24HR 50 MG (Tartrate Equiv): ORAL | 30 days supply | Qty: 30 | Fill #0 | Status: AC

## 2020-05-31 MED FILL — Atorvastatin Calcium Tab 80 MG (Base Equivalent): ORAL | 30 days supply | Qty: 30 | Fill #0 | Status: AC

## 2020-06-01 ENCOUNTER — Other Ambulatory Visit: Payer: Self-pay

## 2020-06-07 ENCOUNTER — Emergency Department (HOSPITAL_BASED_OUTPATIENT_CLINIC_OR_DEPARTMENT_OTHER): Payer: Medicaid Other | Admitting: Radiology

## 2020-06-07 ENCOUNTER — Other Ambulatory Visit: Payer: Self-pay

## 2020-06-07 ENCOUNTER — Emergency Department (HOSPITAL_BASED_OUTPATIENT_CLINIC_OR_DEPARTMENT_OTHER): Payer: Medicaid Other

## 2020-06-07 ENCOUNTER — Encounter (HOSPITAL_BASED_OUTPATIENT_CLINIC_OR_DEPARTMENT_OTHER): Payer: Self-pay

## 2020-06-07 ENCOUNTER — Observation Stay (HOSPITAL_BASED_OUTPATIENT_CLINIC_OR_DEPARTMENT_OTHER)
Admission: EM | Admit: 2020-06-07 | Discharge: 2020-06-08 | Disposition: A | Discharge status: Home / Self Care | Payer: Medicaid Other | Attending: Family Medicine | Admitting: Family Medicine

## 2020-06-07 ENCOUNTER — Encounter: Payer: Self-pay | Admitting: *Deleted

## 2020-06-07 DIAGNOSIS — F17210 Nicotine dependence, cigarettes, uncomplicated: Secondary | ICD-10-CM | POA: Insufficient documentation

## 2020-06-07 DIAGNOSIS — I10 Essential (primary) hypertension: Secondary | ICD-10-CM | POA: Diagnosis present

## 2020-06-07 DIAGNOSIS — I2510 Atherosclerotic heart disease of native coronary artery without angina pectoris: Secondary | ICD-10-CM | POA: Diagnosis present

## 2020-06-07 DIAGNOSIS — Z7982 Long term (current) use of aspirin: Secondary | ICD-10-CM | POA: Diagnosis not present

## 2020-06-07 DIAGNOSIS — Z20822 Contact with and (suspected) exposure to covid-19: Secondary | ICD-10-CM | POA: Insufficient documentation

## 2020-06-07 DIAGNOSIS — E785 Hyperlipidemia, unspecified: Secondary | ICD-10-CM | POA: Diagnosis present

## 2020-06-07 DIAGNOSIS — Z006 Encounter for examination for normal comparison and control in clinical research program: Secondary | ICD-10-CM

## 2020-06-07 DIAGNOSIS — R55 Syncope and collapse: Secondary | ICD-10-CM | POA: Diagnosis not present

## 2020-06-07 DIAGNOSIS — R072 Precordial pain: Secondary | ICD-10-CM

## 2020-06-07 DIAGNOSIS — R079 Chest pain, unspecified: Principal | ICD-10-CM | POA: Diagnosis present

## 2020-06-07 DIAGNOSIS — Z79899 Other long term (current) drug therapy: Secondary | ICD-10-CM | POA: Diagnosis not present

## 2020-06-07 DIAGNOSIS — R4020 Unspecified coma: Secondary | ICD-10-CM

## 2020-06-07 DIAGNOSIS — F17200 Nicotine dependence, unspecified, uncomplicated: Secondary | ICD-10-CM | POA: Diagnosis present

## 2020-06-07 LAB — BASIC METABOLIC PANEL
Anion gap: 9 (ref 5–15)
BUN: 21 mg/dL — ABNORMAL HIGH (ref 6–20)
CO2: 25 mmol/L (ref 22–32)
Calcium: 9.2 mg/dL (ref 8.9–10.3)
Chloride: 108 mmol/L (ref 98–111)
Creatinine, Ser: 1.18 mg/dL (ref 0.61–1.24)
GFR, Estimated: >60 mL/min (ref >60)
Glucose, Bld: 133 mg/dL — ABNORMAL HIGH (ref 70–99)
Potassium: 3.8 mmol/L (ref 3.5–5.1)
Sodium: 142 mmol/L (ref 135–145)

## 2020-06-07 LAB — CBC
HCT: 45.7 % (ref 39.0–52.0)
Hemoglobin: 15.7 g/dL (ref 13.0–17.0)
MCH: 32 pg (ref 26.0–34.0)
MCHC: 34.4 g/dL (ref 30.0–36.0)
MCV: 93.3 fL (ref 80.0–100.0)
Platelets: 222 10*3/uL (ref 150–400)
RBC: 4.9 MIL/uL (ref 4.22–5.81)
RDW: 13.2 % (ref 11.5–15.5)
WBC: 8.8 10*3/uL (ref 4.0–10.5)
nRBC: 0 % (ref 0.0–0.2)

## 2020-06-07 LAB — TROPONIN I (HIGH SENSITIVITY): Troponin I (High Sensitivity): 9 ng/L (ref <18)

## 2020-06-07 MED ORDER — NITROGLYCERIN 0.4 MG SL SUBL: 0.4000 mg | SUBLINGUAL_TABLET | SUBLINGUAL | Status: DC | PRN

## 2020-06-07 MED ORDER — ASPIRIN 81 MG PO CHEW
324.0000 mg | CHEWABLE_TABLET | Freq: Once | ORAL | Status: AC
  Administered 2020-06-07: 324 mg via ORAL
  Filled 2020-06-07: qty 4

## 2020-06-07 MED ORDER — LORAZEPAM 2 MG/ML IJ SOLN
0.5000 mg | Freq: Once | INTRAMUSCULAR | Status: AC
  Administered 2020-06-07: 0.5 mg via INTRAVENOUS
  Filled 2020-06-07: qty 1
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
## 2020-06-07 NOTE — ED Triage Notes (Signed)
Patient here POV from Home with Chest Discomfort.   Pain began 1 HR PTA.  No SOB, No Radiation. Pain is both sides of Chest.  No Medications PTA.   Patient did have MI in January with Stent Placement in Cath Lab.

## 2020-06-07 NOTE — ED Notes (Signed)
Patient requested to have Chest XRAY to not be completed at this time until seen by EDP. Radiology staff made aware.
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
## 2020-06-08 ENCOUNTER — Observation Stay (HOSPITAL_BASED_OUTPATIENT_CLINIC_OR_DEPARTMENT_OTHER): Payer: Medicaid Other

## 2020-06-08 DIAGNOSIS — F17210 Nicotine dependence, cigarettes, uncomplicated: Secondary | ICD-10-CM | POA: Diagnosis not present

## 2020-06-08 DIAGNOSIS — Z20822 Contact with and (suspected) exposure to covid-19: Secondary | ICD-10-CM | POA: Diagnosis not present

## 2020-06-08 DIAGNOSIS — R079 Chest pain, unspecified: Secondary | ICD-10-CM

## 2020-06-08 DIAGNOSIS — Z7982 Long term (current) use of aspirin: Secondary | ICD-10-CM | POA: Diagnosis not present

## 2020-06-08 DIAGNOSIS — I2510 Atherosclerotic heart disease of native coronary artery without angina pectoris: Secondary | ICD-10-CM | POA: Diagnosis not present

## 2020-06-08 DIAGNOSIS — R55 Syncope and collapse: Secondary | ICD-10-CM

## 2020-06-08 DIAGNOSIS — Z720 Tobacco use: Secondary | ICD-10-CM

## 2020-06-08 DIAGNOSIS — Z79899 Other long term (current) drug therapy: Secondary | ICD-10-CM | POA: Diagnosis not present

## 2020-06-08 DIAGNOSIS — I10 Essential (primary) hypertension: Secondary | ICD-10-CM | POA: Diagnosis not present

## 2020-06-08 LAB — ECHOCARDIOGRAM COMPLETE
AR max vel: 3.38 cm2
AV Area VTI: 3.05 cm2
AV Area mean vel: 3.37 cm2
AV Mean grad: 7 mmHg
AV Peak grad: 11.6 mmHg
Ao pk vel: 1.7 m/s
Area-P 1/2: 2.95 cm2
Calc EF: 54.1 %
Height: 77 in
MV M vel: 4.42 m/s
MV Peak grad: 78 mmHg
S' Lateral: 2.6 cm
Single Plane A2C EF: 42.5 %
Single Plane A4C EF: 62.1 %
Weight: 4163.2 oz

## 2020-06-08 LAB — RESP PANEL BY RT-PCR (FLU A&B, COVID) ARPGX2
Influenza A by PCR: NEGATIVE
Influenza B by PCR: NEGATIVE
SARS Coronavirus 2 by RT PCR: NEGATIVE

## 2020-06-08 LAB — TROPONIN I (HIGH SENSITIVITY)
Troponin I (High Sensitivity): 11 ng/L (ref <18)
Troponin I (High Sensitivity): 12 ng/L (ref <18)
Troponin I (High Sensitivity): 13 ng/L (ref <18)
Troponin I (High Sensitivity): 16 ng/L (ref <18)

## 2020-06-08 MED ORDER — VITAMIN D 25 MCG (1000 UNIT) PO TABS
2000.0000 [IU] | ORAL_TABLET | Freq: Every day | ORAL | Status: DC
  Administered 2020-06-08: 2000 [IU] via ORAL
  Filled 2020-06-08: qty 2

## 2020-06-08 MED ORDER — BUPROPION HCL ER (SR) 150 MG PO TB12
150.0000 mg | ORAL_TABLET | Freq: Every day | ORAL | Status: DC
  Administered 2020-06-08: 150 mg via ORAL
  Filled 2020-06-08: qty 1

## 2020-06-08 MED ORDER — NICOTINE 14 MG/24HR TD PT24
14.0000 mg | MEDICATED_PATCH | TRANSDERMAL | 0 refills | Status: AC
  Filled 2020-06-08: qty 28, 28d supply, fill #0

## 2020-06-08 MED ORDER — ENOXAPARIN SODIUM 40 MG/0.4ML ~~LOC~~ SOLN
40.0000 mg | SUBCUTANEOUS | Status: DC
  Filled 2020-06-08: qty 0.4

## 2020-06-08 MED ORDER — NICOTINE 7 MG/24HR TD PT24
7.0000 mg | MEDICATED_PATCH | Freq: Every day | TRANSDERMAL | Status: DC
  Filled 2020-06-08: qty 1

## 2020-06-08 MED ORDER — ACETAMINOPHEN 325 MG PO TABS: 650.0000 mg | ORAL_TABLET | ORAL | Status: DC | PRN

## 2020-06-08 MED ORDER — EZETIMIBE 10 MG PO TABS
10.0000 mg | ORAL_TABLET | Freq: Every day | ORAL | Status: DC
  Administered 2020-06-08: 10 mg via ORAL
  Filled 2020-06-08: qty 1

## 2020-06-08 MED ORDER — ONDANSETRON HCL 4 MG/2ML IJ SOLN: 4.0000 mg | Freq: Four times a day (QID) | INTRAMUSCULAR | Status: DC | PRN

## 2020-06-08 MED ORDER — METOPROLOL SUCCINATE ER 50 MG PO TB24
50.0000 mg | ORAL_TABLET | Freq: Every day | ORAL | Status: DC
  Administered 2020-06-08: 50 mg via ORAL
  Filled 2020-06-08: qty 1

## 2020-06-08 MED ORDER — ATORVASTATIN CALCIUM 80 MG PO TABS
80.0000 mg | ORAL_TABLET | Freq: Every day | ORAL | Status: DC
  Administered 2020-06-08: 80 mg via ORAL
  Filled 2020-06-08: qty 1

## 2020-06-08 MED ORDER — NITROGLYCERIN 2 % TD OINT
0.5000 [in_us] | TOPICAL_OINTMENT | Freq: Once | TRANSDERMAL | Status: AC
  Administered 2020-06-08: 0.5 [in_us] via TOPICAL
  Filled 2020-06-08: qty 1

## 2020-06-08 MED ORDER — KETOROLAC TROMETHAMINE 30 MG/ML IJ SOLN
30.0000 mg | Freq: Once | INTRAMUSCULAR | Status: AC
  Administered 2020-06-08: 30 mg via INTRAVENOUS
  Filled 2020-06-08: qty 1

## 2020-06-08 MED ORDER — ENALAPRIL MALEATE 5 MG PO TABS
10.0000 mg | ORAL_TABLET | Freq: Every day | ORAL | Status: DC
  Administered 2020-06-08: 10 mg via ORAL
  Filled 2020-06-08: qty 2

## 2020-06-08 MED ORDER — TICAGRELOR 90 MG PO TABS
90.0000 mg | ORAL_TABLET | Freq: Two times a day (BID) | ORAL | Status: DC
  Administered 2020-06-08: 90 mg via ORAL
  Filled 2020-06-08: qty 1

## 2020-06-08 MED ORDER — LORAZEPAM 1 MG PO TABS
1.0000 mg | ORAL_TABLET | Freq: Once | ORAL | Status: AC | PRN
  Administered 2020-06-08: 1 mg via ORAL
  Filled 2020-06-08: qty 1

## 2020-06-08 MED ORDER — ASPIRIN EC 81 MG PO TBEC
81.0000 mg | DELAYED_RELEASE_TABLET | Freq: Every day | ORAL | Status: DC
  Administered 2020-06-08: 81 mg via ORAL
  Filled 2020-06-08: qty 1

## 2020-06-08 NOTE — ED Notes (Signed)
Report given to [facility] at this time. All questions answered.

## 2020-06-08 NOTE — ED Notes (Signed)
Called [facility] to transport patient to [facility] rm [address]

## 2020-06-08 NOTE — Discharge Summary (Signed)
Physician Discharge Summary  Eland Lamantia ZOX:[id number] DOB: [birth year] DOA: 06/07/2020  PCP: Claiborne Rigg, NP  Admit date: 06/07/2020 Discharge date: 06/08/2020  Time spent: 40 minutes  Recommendations for Outpatient Follow-up:  1. Follow outpatient CBC/CMP 2. Follow cardiology outpatient for CP and syncope 3. Follow with neurology outpatient for syncope  4. Follow smoking cessation outpatient 5. Sleep study outpatient 6. Follow aneurysm outpatient with CT surgery  Discharge Diagnoses:  Principal Problem:   Chest pain Active Problems:   Coronary artery disease   Dyslipidemia   Tobacco dependence   Essential hypertension   Discharge Condition: stable  Diet recommendation: heart healthy  Filed Weights   06/07/20 2135 06/08/20 0407  Weight: 115.7 kg 118 kg    History of present illness:  Isaac Goodwin is Isaac Goodwin 50 y.o. male with medical history significant of severe multivessel CAD status post PCI and recent STEMI in January 2022, ascending aortic aneurysm followed by Dr. Laneta Simmers, hypertension, hyperlipidemia, OSA presented to the ED today with complaint of chest tightness which started an hour prior to arrival.   His workup was reassuring with negative troponins, unremarkable EKG.  He was seen by cardiology who recommended outpatient follow up with medical management.  He also noted recurrent syncope which we recommended following outpatient with neurology.    See below for additional details  Hospital Course:  Chest pain in the setting of severe multivessel CAD status post PCI with recent STEMI in January 2022 Work-up so far not suggestive of ACS as high-sensitivity troponin negative and EKG without acute ischemic changes.  Chest pain could possibly be related to stress/anxiety and could also possibly be musculoskeletal as it is reproducible on exam.  Cardiology was c/s, recommending continue medical treatment - echo per cards EF and wall motion abnormality slightly better  compared to last study (see her note).  Recommending outpatient follow up with medical therapy (DAPT, beta blocker, statin).  Thought 2/2 stress vs musculoskeletal.   Presyncope He notes recurrent lightheadedness/presyncopal vs syncopal episodes - recently completed zio monitor with short runs of NSVT and SVT as well as wenckebach.  Negative orthostatics.  Recommending neurology follow outpatient.  Discussed recommendation to not drive, which he was pretty upset about (understandably given need to work/drive, etc).  Echo with inferior basal mild hypokinesis, LVEF 50-55%, low normal function, RWMA (see report).   Neurology referral outpatient   Hypertension Stable. -Resume home metoprolol and enalapril  -follow metop dosing with PCP  Hyperlipidemia -resume home meds  Ascending aortic aneurysm -Followed by Dr. Laneta Simmers, outpatient follow-up. - follow outpatient   OSA Currently not on CPAP as his machine broke down Trinidad Ingle year ago. -Needs Christabel Camire new CPAP machine - needs sleep study outpatient, follow  Tobacco use -Patient smokes 2 to 3 cigarettes Mikael Skoda day and is declining nicotine patch.  He has been counseled to quit. -already taking wellbutrin, requests nicotine patch at discharge - prescribed  Procedures: Echo IMPRESSIONS    1. Inferior basal mild hypokinesis . Left ventricular ejection fraction,  by estimation, is 50 to 55%. The left ventricle has low normal function.  The left ventricle demonstrates regional wall motion abnormalities (see  scoring diagram/findings for  description). There is severe left ventricular hypertrophy. Left  ventricular diastolic parameters were normal.  2. Right ventricular systolic function is normal. The right ventricular  size is normal.  3. Left atrial size was moderately dilated.  4. The mitral valve is abnormal. Trivial mitral valve regurgitation. No  evidence of mitral stenosis.  5.  The aortic valve is tricuspid. There is mild calcification of  the  aortic valve. Aortic valve regurgitation is not visualized. Mild aortic  valve sclerosis is present, with no evidence of aortic valve stenosis.  6. The inferior vena cava is normal in size with greater than 50%  respiratory variability, suggesting right atrial pressure of 3 mmHg.   Consultations:  cardiology  Discharge Exam: Vitals:   06/08/20 1301 06/08/20 1559  BP: (!) 119/98 121/87  Pulse: 65 64  Resp:  18  Temp:  97.9 F (36.6 C)  SpO2: 94% 96%   No new complaints Eager to d/c home Frustrated, anxious - doesn't like not being told to drive - frustrated about implications of this  General: No acute distress. Cardiovascular: Heart sounds show Sharea Guinther regular rate, and rhythm. Lungs: Clear to auscultation bilaterally  Abdomen: Soft, nontender, nondistended  Neurological: Alert and oriented 3. Moves all extremities 4 . Cranial nerves II through XII grossly intact. Skin: Warm and dry. No rashes or lesions. Extremities: No clubbing or cyanosis. No edema.  Discharge Instructions   Discharge Instructions    Ambulatory referral to Neurology   Complete by: As directed    An appointment is requested in approximately: 2 weeks   Call MD for:  difficulty breathing, headache or visual disturbances   Complete by: As directed    Call MD for:  extreme fatigue   Complete by: As directed    Call MD for:  hives   Complete by: As directed    Call MD for:  persistant dizziness or light-headedness   Complete by: As directed    Call MD for:  persistant nausea and vomiting   Complete by: As directed    Call MD for:  redness, tenderness, or signs of infection (pain, swelling, redness, odor or green/yellow discharge around incision site)   Complete by: As directed    Call MD for:  severe uncontrolled pain   Complete by: As directed    Call MD for:  temperature >100.4   Complete by: As directed    Diet - low sodium heart healthy   Complete by: As directed    Discharge instructions    Complete by: As directed    You were seen for chest pain.  Your work up was reassuring.  Please follow up with cardiology as an outpatient.  For you presyncope (dizziness, fainting), you should avoid driving until you've been symptom free for 6 months.  We'd like you to see neurology to look into this further.    Follow up with CT surgery for your aneurysm.  You should talk to your PCP about Amai Cappiello repeat sleep study outpatient.   Return for new, recurrent, or worsening symptoms.  Please ask your PCP to request records from this hospitalization so they know what was done and what the next steps will be.   Increase activity slowly   Complete by: As directed      Allergies as of 06/08/2020      Reactions   Gabapentin Other (See Comments)   LOC changes- memory loss   Penicillins Anaphylaxis, Shortness Of Breath, Other (See Comments)   "Was told as Shalisha Clausing child that it caused breathing problems"      Medication List    STOP taking these medications   furosemide 40 MG tablet Commonly known as: LASIX   potassium chloride SA 20 MEQ tablet Commonly known as: KLOR-CON   Vitamin D (Ergocalciferol) 1.25 MG (50000 UNIT) Caps capsule Commonly known as:  DRISDOL     TAKE these medications   acetaminophen 500 MG tablet Commonly known as: TYLENOL Take 1,000 mg by mouth every 6 (six) hours as needed for mild pain.   Aspirin Low Dose 81 MG EC tablet Generic drug: aspirin TAKE 1 TABLET (81 MG TOTAL) BY MOUTH DAILY. What changed: how much to take   atorvastatin 80 MG tablet Commonly known as: LIPITOR TAKE 1 TABLET (80 MG TOTAL) BY MOUTH DAILY. What changed: how much to take   buPROPion 150 MG 12 hr tablet Commonly known as: WELLBUTRIN SR TAKE 1 TABLET DAILY FOR ONE WEEK WHILE DECREASING SMOKING. STOP SMOKING AND INCREASE TO 1 TABLET TWICE Loray Akard DAY FOR 7 WEEKS. What changed:   how much to take  how to take this  when to take this   cholecalciferol 25 MCG (1000 UNIT) tablet Commonly known  as: VITAMIN D3 Take 1,000 Units by mouth daily.   co-enzyme Q-10 30 MG capsule Take 30 mg by mouth daily.   enalapril 10 MG tablet Commonly known as: VASOTEC TAKE 1 TABLET (10 MG TOTAL) BY MOUTH DAILY. What changed: how much to take   ezetimibe 10 MG tablet Commonly known as: ZETIA Take 1 tablet (10 mg total) by mouth daily.   lidocaine 5 % Commonly known as: LIDODERM PLACE 1 PATCH ONTO THE SKIN DAILY AS NEEDED (FOR BACK PAIN). What changed:   how much to take  how to take this  when to take this  reasons to take this   methocarbamol 500 MG tablet Commonly known as: ROBAXIN TAKE 2 TABLETS (1,000 MG TOTAL) BY MOUTH EVERY 6 (SIX) HOURS AS NEEDED FOR MUSCLE SPASMS. What changed:   how much to take  how to take this  when to take this  reasons to take this   metoprolol succinate 50 MG 24 hr tablet Commonly known as: TOPROL-XL TAKE ONE TABLET BY MOUTH DAILY *NEED APPOINTMENT FOR MORE REFILLS* What changed: See the new instructions.   nicotine 14 mg/24hr patch Commonly known as: NICODERM CQ - dosed in mg/24 hours Place 1 patch (14 mg total) onto the skin daily. To aid with smoking cessation   nitroGLYCERIN 0.4 MG SL tablet Commonly known as: NITROSTAT PLACE 1 TABLET (0.4 MG TOTAL) UNDER THE TONGUE EVERY FIVE MINUTES AS NEEDED. What changed:   how much to take  how to take this  when to take this  reasons to take this   SOS-AMI selatogrel or placebo 16 mg/0.5 mL Inj SQ injection Inject 16 mg into the skin once. Inject 0.5 mL subcutaneously in the abdomen or thigh if you experience symptoms of Michaeline Eckersley heart attack. Call 911 immediately  and seek emergency medical support.   ticagrelor 90 MG Tabs tablet Commonly known as: BRILINTA Take 1 tablet (90 mg total) by mouth 2 (two) times daily.      Allergies  Allergen Reactions  . Gabapentin Other (See Comments)    LOC changes- memory loss  . Penicillins Anaphylaxis, Shortness Of Breath and Other (See Comments)     "Was told as Donnarae Rae child that it caused breathing problems"       The results of significant diagnostics from this hospitalization (including imaging, microbiology, ancillary and laboratory) are listed below for reference.    Significant Diagnostic Studies: DG Chest Portable 1 View  Result Date: 06/07/2020 CLINICAL DATA:  Chest pain EXAM: PORTABLE CHEST 1 VIEW COMPARISON:  03/17/2020 FINDINGS: Cardiac shadow is within normal limits. The lungs are clear bilaterally. No focal  infiltrate or effusion is seen. No bony abnormality is noted. IMPRESSION: No acute abnormality noted. Electronically Signed   By: Alcide Clever M.D.   On: 06/07/2020 23:08   ECHOCARDIOGRAM COMPLETE  Result Date: 06/08/2020    ECHOCARDIOGRAM REPORT   Patient Name:   Isaac Goodwin Date of Exam: 06/08/2020 Medical Rec #:  [id number]    Height:       77.0 in Accession #:    [id number]   Weight:       260.2 lb Date of Birth:  08/17/1970    BSA:          2.501 m Patient Age:    49 years     BP:           113/83 mmHg Patient Gender: M            HR:           61 bpm. Exam Location:  Inpatient Procedure: 2D Echo, Cardiac Doppler and Color Doppler Indications:    Chest pain  History:        Patient has prior history of Echocardiogram examinations, most                 recent 02/29/2020. CAD and Previous Myocardial Infarction; Risk                 Factors:Hypertension.  Sonographer:    Neomia Dear RDCS Referring Phys: [id number] John Giovanni  Sonographer Comments: 02/28/20 cath IMPRESSIONS  1. Inferior basal mild hypokinesis . Left ventricular ejection fraction, by estimation, is 50 to 55%. The left ventricle has low normal function. The left ventricle demonstrates regional wall motion abnormalities (see scoring diagram/findings for description). There is severe left ventricular hypertrophy. Left ventricular diastolic parameters were normal.  2. Right ventricular systolic function is normal. The right ventricular size is normal.  3. Left atrial  size was moderately dilated.  4. The mitral valve is abnormal. Trivial mitral valve regurgitation. No evidence of mitral stenosis.  5. The aortic valve is tricuspid. There is mild calcification of the aortic valve. Aortic valve regurgitation is not visualized. Mild aortic valve sclerosis is present, with no evidence of aortic valve stenosis.  6. The inferior vena cava is normal in size with greater than 50% respiratory variability, suggesting right atrial pressure of 3 mmHg. FINDINGS  Left Ventricle: Inferior basal mild hypokinesis. Left ventricular ejection fraction, by estimation, is 50 to 55%. The left ventricle has low normal function. The left ventricle demonstrates regional wall motion abnormalities. The left ventricular internal cavity size was normal in size. There is severe left ventricular hypertrophy. Left ventricular diastolic parameters were normal. Right Ventricle: The right ventricular size is normal. No increase in right ventricular wall thickness. Right ventricular systolic function is normal. Left Atrium: Left atrial size was moderately dilated. Right Atrium: Right atrial size was normal in size. Pericardium: There is no evidence of pericardial effusion. Mitral Valve: The mitral valve is abnormal. There is mild thickening of the mitral valve leaflet(s). There is mild calcification of the mitral valve leaflet(s). Mild mitral annular calcification. Trivial mitral valve regurgitation. No evidence of mitral valve stenosis. Tricuspid Valve: The tricuspid valve is normal in structure. Tricuspid valve regurgitation is not demonstrated. No evidence of tricuspid stenosis. Aortic Valve: The aortic valve is tricuspid. There is mild calcification of the aortic valve. Aortic valve regurgitation is not visualized. Mild aortic valve sclerosis is present, with no evidence of aortic valve stenosis. Aortic valve mean gradient measures 7.0 mmHg.  Aortic valve peak gradient measures 11.6 mmHg. Aortic valve area, by VTI  measures 3.05 cm. Pulmonic Valve: The pulmonic valve was normal in structure. Pulmonic valve regurgitation is not visualized. No evidence of pulmonic stenosis. Aorta: The aortic root is normal in size and structure. Venous: The inferior vena cava is normal in size with greater than 50% respiratory variability, suggesting right atrial pressure of 3 mmHg. IAS/Shunts: No atrial level shunt detected by color flow Doppler.  LEFT VENTRICLE PLAX 2D LVIDd:         4.30 cm LVIDs:         2.60 cm LV PW:         1.60 cm LV IVS:        1.70 cm LVOT diam:     2.50 cm LV SV:         126 LV SV Index:   50 LVOT Area:     4.91 cm  LV Volumes (MOD) LV vol d, MOD A2C: 56.5 ml LV vol d, MOD A4C: 107.0 ml LV vol s, MOD A2C: 32.5 ml LV vol s, MOD A4C: 40.6 ml LV SV MOD A2C:     24.0 ml LV SV MOD A4C:     107.0 ml LV SV MOD BP:      42.8 ml RIGHT VENTRICLE RV S prime:     12.90 cm/s TAPSE (M-mode): 1.5 cm LEFT ATRIUM             Index       RIGHT ATRIUM           Index LA diam:        4.50 cm 1.80 cm/m  RA Area:     12.70 cm LA Vol (A2C):   43.8 ml 17.51 ml/m RA Volume:   24.70 ml  9.87 ml/m LA Vol (A4C):   42.2 ml 16.87 ml/m LA Biplane Vol: 44.6 ml 17.83 ml/m  AORTIC VALVE                    PULMONIC VALVE AV Area (Vmax):    3.38 cm     PV Vmax:       0.87 m/s AV Area (Vmean):   3.37 cm     PV Vmean:      66.800 cm/s AV Area (VTI):     3.05 cm     PV VTI:        0.213 m AV Vmax:           170.00 cm/s  PV Peak grad:  3.0 mmHg AV Vmean:          122.000 cm/s PV Mean grad:  2.0 mmHg AV VTI:            0.412 m AV Peak Grad:      11.6 mmHg AV Mean Grad:      7.0 mmHg LVOT Vmax:         117.00 cm/s LVOT Vmean:        83.700 cm/s LVOT VTI:          0.256 m LVOT/AV VTI ratio: 0.62  AORTA Ao Root diam: 3.40 cm Ao Asc diam:  3.30 cm MITRAL VALVE MV Area (PHT): 2.95 cm     SHUNTS MV Decel Time: 257 msec     Systemic VTI:  0.26 m MR Peak grad: 78.0 mmHg     Systemic Diam: 2.50 cm MR Mean grad: 59.0 mmHg MR Vmax:      441.50 cm/s  MR  Vmean:     368.0 cm/s MV E velocity: 94.10 cm/s MV Savahanna Almendariz velocity: 115.00 cm/s MV E/Zadiel Leyh ratio:  0.82 Charlton Haws MD Electronically signed by Charlton Haws MD Signature Date/Time: 06/08/2020/12:58:26 PM    Final     Microbiology: Recent Results (from the past 240 hour(s))  Resp Panel by RT-PCR (Flu Sabir Charters&B, Covid) Nasopharyngeal Swab     Status: None   Collection Time: 06/07/20 11:51 PM   Specimen: Nasopharyngeal Swab; Nasopharyngeal(NP) swabs in vial transport medium  Result Value Ref Range Status   SARS Coronavirus 2 by RT PCR NEGATIVE NEGATIVE Final    Comment: (NOTE) SARS-CoV-2 target nucleic acids are NOT DETECTED.  The SARS-CoV-2 RNA is generally detectable in upper respiratory specimens during the acute phase of infection. The lowest concentration of SARS-CoV-2 viral copies this assay can detect is 138 copies/mL. Brylea Pita negative result does not preclude SARS-Cov-2 infection and should not be used as the sole basis for treatment or other patient management decisions. Eitan Doubleday negative result may occur with  improper specimen collection/handling, submission of specimen other than nasopharyngeal swab, presence of viral mutation(s) within the areas targeted by this assay, and inadequate number of viral copies(<138 copies/mL). Carigan Lister negative result must be combined with clinical observations, patient history, and epidemiological information. The expected result is Negative.  Fact Sheet for Patients:  BloggerCourse.com  Fact Sheet for Healthcare Providers:  SeriousBroker.it  This test is no t yet approved or cleared by the [place] FDA and  has been authorized for detection and/or diagnosis of SARS-CoV-2 by FDA under an Emergency Use Authorization (EUA). This EUA will remain  in effect (meaning this test can be used) for the duration of the COVID-19 declaration under Section 564(b)(1) of the Act, 21 U.S.C.section 360bbb-3(b)(1), unless the authorization  is terminated  or revoked sooner.       Influenza Brodrick Curran by PCR NEGATIVE NEGATIVE Final   Influenza B by PCR NEGATIVE NEGATIVE Final    Comment: (NOTE) The Xpert Xpress SARS-CoV-2/FLU/RSV plus assay is intended as an aid in the diagnosis of influenza from Nasopharyngeal swab specimens and should not be used as Dovber Ernest sole basis for treatment. Nasal washings and aspirates are unacceptable for Xpert Xpress SARS-CoV-2/FLU/RSV testing.  Fact Sheet for Patients: BloggerCourse.com  Fact Sheet for Healthcare Providers: SeriousBroker.it  This test is not yet approved or cleared by the [place] FDA and has been authorized for detection and/or diagnosis of SARS-CoV-2 by FDA under an Emergency Use Authorization (EUA). This EUA will remain in effect (meaning this test can be used) for the duration of the COVID-19 declaration under Section 564(b)(1) of the Act, 21 U.S.C. section 360bbb-3(b)(1), unless the authorization is terminated or revoked.  Performed at [facility]      Labs: Basic Metabolic Panel: Recent Labs  Lab 06/07/20 2150  NA 142  K 3.8  CL 108  CO2 25  GLUCOSE 133*  BUN 21*  CREATININE 1.18  CALCIUM 9.2   Liver Function Tests: No results for input(s): AST, ALT, ALKPHOS, BILITOT, PROT, ALBUMIN in the last 168 hours. No results for input(s): LIPASE, AMYLASE in the last 168 hours. No results for input(s): AMMONIA in the last 168 hours. CBC: Recent Labs  Lab 06/07/20 2150  WBC 8.8  HGB 15.7  HCT 45.7  MCV 93.3  PLT 222   Cardiac Enzymes: No results for input(s): CKTOTAL, CKMB, CKMBINDEX, TROPONINI in the last 168 hours. BNP: BNP (last 3 results) No results for input(s): BNP in  the last 8760 hours.  ProBNP (last 3 results) No results for input(s): PROBNP in the last 8760 hours.  CBG: No results for input(s): GLUCAP in the last 168 hours.     Signed:  Lacretia Nicks MD.  [facility] 06/08/2020, 8:27 PM

## 2020-06-08 NOTE — Consult Note (Addendum)
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 LCX to LAD and then STEMI in 02/2020 s/p DES to LCX, balloon angioplasty to OM1, and balloon angioplasty/DES to OM2. Also noted to have occluded RCA but has collaterals.  - EKG shows no acute findings. - High-sensitivity troponin negative x4.  - Echo showed LVEF of 50-55% with mild hypokinesis of inferior basal wall as well as severe LVH. Reviewed with Dr. Harrington Challenger - EF and wall motion abnormality look slightly better compared to last study in 02/2020. - Continue DAPT - Aspirin and Brilinta. - Continue beta-blocker and high-intensity statin. - Do not think this was cardiac chest pain that brought him in. Possibly secondary to stress vs musculoskeletal in nature given chest wall pain on exam. No ischemic work-up necessary. Could consider adding PPI for possible reflux. He does not some intermittent resting chest pain but is able to ride 10 miles on stationary bike without any problems. Could consider adding Imdur.   Of note, he does  describe transient shortness of breath which sounds like it is from [place]. Patient OK continuing Brilinta for now. Discussed that caffeine can sometimes help this.  Ascending Aortic Aneurysm - Chest CTA in 02/2020 showed dilatation of the ascending aorta  Per review of [facility] measures 4.2cm.  - Annual imaging recommended with f/u by B Barte  Hypertension  - Diastolic  BP elevated at times. - Continue home ACEi and beta-blocker.  Hyperlipidemia - Lipid panel on 04/26/2020: Total Cholesterol 141, Triglycerides 123, HDL 31, LDL 88. - LDL goal <70 given CAD. - Continue Lipitor 67m daily.  Obstructive Sleep Apnea - CPAP machine broke several years ago. - Consider repeat sleep study as outpatient.  Syncope - After I saw the patient, primary team notified uKoreathat patient reported recent syncopal episode. Patient did not note this during my conversation with him. Dr. RHarrington Challengerwill discuss with patient when she sees.  - Patient did recently have a 2 week Zio monitor in 03/2020 which showed underlying sinus rhythm with shorts runs of NSVT and SVT as well as one episode of Wenckebach.  Tobacco Abuse - Patient smokes 3 cigarettes per day. Willing to quit and would like to use Nicotine gum/patches. However, patient was previously told to avoid this given his aneurysm. - Discussed with Dr. RHarrington Challenger- OK to use Nicotine patches but very important that he does not smoke while he is on the patches. Will order.   Risk Assessment/Risk Scores:   HEAR Score (for undifferentiated chest pain):  HEAR Score: 3{   For questions or updates, please contact CCresbardPlease consult [email or web address] for contact info under    Signed, CDarreld Mclean PA-C  06/08/2020 2:53 PM   Patient seen and examined   I have reviewed findings as noted above by C Goodrich Pt is a 50yo with extensive CAD, tobacco abuse who presented to ED yesterday with CP  Described above as a pressure, indigestion sensation  Different for  angina   Is under increased stress as noted above  SInce admit he has r/o for MI    ON exam,  Pt is not orthostatic JVP at 9 cm Lungs are CTA  Cardiac RRR  NO S3   No murmurs Abd SUpple Ext are without edema   Good pulses  I have reviewed echo:   I think LVEF is better than reported and wall motion changes are very mild   LVEF probably 55 to 60% ; 1 chest discomfort   I am not convinced represents angina   I would continue medical Rx  IN regards to syncope, I did not get the hx initially     On questioning again he says he had one spell of idzziness where he fell down the stairs   Was wearing monitor at the time  Triggered it    There was another episode prior to wearing the monitor   He had been in the car and got out to get gas   [place]  Woke up on ground of gas station   He says passersby said he was out for about 1 min   No real prodome  I have reviewed monitor    Triggered events correlated with SR only, SR with isolated PVC, sinus rhythm with short burst NSVT.   There was one episode of NSVT that ws not sensed   What the syncopal spell represents is still not clear.   I would keep on current medical regimen   Hx has varied   ? Neuro evaluation.  For now would recomm no driving for 6 months until cause idenitfied.  WIll arrange for outpt f/u in cardiology.  PDorris CarnesMD
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
## 2020-06-08 NOTE — Research (Addendum)
Patient came to [facility] last night after walking from his home to the [facility]; he was having some Chest discomfort after having some very stressful issues at home. He is under a observation status at this time. I talked to him and he states that his pain is only related to his anxiety and increased stress. The pain is different from what he has experienced with his past MI occurrences so he did not use either of his SOS-AMI autoinjectors. 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 He did not bring the damaged autoinjector to the hospital and doesn't believe his wife will have transportation to bring it to me now. He was getting his Echo so I will go back up and speak to him again later today.  At 1600, I went back to see the patient but he was sleeping and could not be wakened. He is still in observation status and the plan is to discharge him soon. I will try to call him next week.    I have reported this episode to the study sponsor, IDORSIA.

## 2020-06-08 NOTE — ED Provider Notes (Signed)
I assumed care of this patient.  Please see previous provider note for further details of Hx, PE.  Briefly patient is a 50 y.o. male who presented chest discomfort in the setting of emotional stress.  Reports that it similar to his prior chest pain when he had his heart attack though this is not as intense.  EKG without acute ischemic changes.  Initial troponin negative.  Pending second troponin and consultation to cardiology.  Dr. Wilkie Aye would like patient to be admitted for ACS rule out once [facility] results and cardiology has been consulted.  Trope with slight trend up but relatively flat. Spoke with Dr. Deforest Hoyles from cardiology who agreed with medicine admission for ACS rule out.        Nira Conn, MD 06/08/20 [phone]

## 2020-06-08 NOTE — ED Notes (Signed)
[facility] at Bedside. 

## 2020-06-09 ENCOUNTER — Other Ambulatory Visit: Payer: Self-pay

## 2020-06-12 ENCOUNTER — Telehealth: Payer: Self-pay

## 2020-06-12 NOTE — Telephone Encounter (Signed)
Transition Care Management Unsuccessful Follow-up Telephone Call  Date of discharge and from where:  06/08/2020, [facility]  Attempts:  1st Attempt  Reason for unsuccessful TCM follow-up call:  Left voice message on # [phone].  Need to discuss scheduling a follow up appointment with PCP

## 2020-06-13 ENCOUNTER — Telehealth: Payer: Self-pay

## 2020-06-13 NOTE — Telephone Encounter (Signed)
Transition Care Management Unsuccessful Follow-up Telephone Call  Date of discharge and from where:  06/08/2020, [facility]   Attempts:  2nd Attempt  Reason for unsuccessful TCM follow-up call:  Left voice message on # [phone].   Need to discuss scheduling a follow up appointment with PCP

## 2020-06-14 ENCOUNTER — Other Ambulatory Visit: Payer: Self-pay

## 2020-06-14 ENCOUNTER — Telehealth: Payer: Self-pay

## 2020-06-14 ENCOUNTER — Encounter: Payer: Self-pay | Admitting: *Deleted

## 2020-06-14 DIAGNOSIS — I10 Essential (primary) hypertension: Secondary | ICD-10-CM

## 2020-06-14 DIAGNOSIS — Z006 Encounter for examination for normal comparison and control in clinical research program: Secondary | ICD-10-CM

## 2020-06-14 MED ORDER — METOPROLOL SUCCINATE ER 50 MG PO TB24
75.0000 mg | ORAL_TABLET | Freq: Every day | ORAL | 3 refills | Status: DC
  Filled 2020-06-14: qty 135, 90d supply, fill #0
  Filled 2020-07-07: qty 45, 30d supply, fill #0
  Filled 2020-08-18: qty 45, 30d supply, fill #1
  Filled 2020-10-03: qty 45, 30d supply, fill #2
  Filled 2020-11-24 – 2020-12-08 (×2): qty 45, 30d supply, fill #3
  Filled 2021-01-24: qty 45, 30d supply, fill #4

## 2020-06-14 NOTE — Telephone Encounter (Signed)
Transition Care Management Unsuccessful Follow-up Telephone Call  Date of discharge and from where:  06/08/2020, [facility]  Attempts:  3rd Attempt  Reason for unsuccessful TCM follow-up call:  Left voice message on # [phone].   Letter sent to patient requesting he call [facility] to schedule hospital follow up appointment

## 2020-06-14 NOTE — Research (Signed)
I tried to contact this patient to arrange for the autoinjector exchange. I had to leave another message for [name] to call me back. I have updated SOS-AMI study sponsor.

## 2020-06-16 ENCOUNTER — Other Ambulatory Visit: Payer: Self-pay

## 2020-06-20 ENCOUNTER — Encounter: Payer: Self-pay | Admitting: *Deleted

## 2020-06-20 DIAGNOSIS — Z006 Encounter for examination for normal comparison and control in clinical research program: Secondary | ICD-10-CM

## 2020-06-20 NOTE — Research (Signed)
VISIT 4 (6 month) phone call   Isaac Goodwin has not returned the cold excursion autoinjector as of this time. He states that he is having severe financial problems and has no car or any means of transportation now. He is supposed to make an appointment with his PCP to get some Chantix patches to help him stop smoking. He says that he and his family "are going to be homeless soon". I told him to ask [facility] if they had any contacts or groups that might be a resource for him to use financially.   I have again told him to call me whenever he is going to be in the vicinity and we will change out the damaged autoinjector. He voiced understanding that he only has 1 "good" autoinjector until he exchanges the damaged one.    [id number], Isaac Goodwin     FOLLOW-UP PHONE CALLS  SUBJECT ID:  [id number] Trainer's name: Eulogio Ditch, RN  Trainer's signature: on Delegation of Authority Log Date of the Follow up call:     20-Jun-2020  Visit Number:  4 Start time of the Follow up call: 2:42   End time of call: 2:55   - CONTACT  Q1;  Was the follow up phone call done with the subject?  [x]   YES  []   NO           If NO, check the following:      Q2:    Was the follow up phone call done with a family member               Or caregiver?    []   YES   [x]   NO          Make note about reasons ___wife not home__________________    - MEDICAL CONDITION      Q3:  Did any of the following occur?   []   Death       []   Hospitalization (any cause)   NONE      []   Use of autoinjector  Make note about the type of event, and when it occurred. In case of hospitalization: the Location of the hospital and/or the treating physician's contact details   ____________ ____________________________________________________________________ ____________________________________________________________________  Note: remember to report any SAE/AE which has occurred within 30 days after any  injection of the study drug on relevant eCRF forms.   Q4:  Did the subject develop any condition which is an          exclusion criterion?  []   YES  [x]   NO   Take note about the occurrence of any exclusion criterion after randomization: _________________________________________________________________ __________________________________________________________________  Q5: Was there any change in subject's antithrombotic therapy?   []   YES  [x]   NO     Take not about any change of antithrombotic treatment: _______________________ ____________________________________________________________________ ____________________________________________________________________      OF THE STUDY-SPECFIC TRAINING  Q6: Did the subject correctly reply to the following questions?       A.  What are common heart attack symptoms?      [x]   YES   []   NO      B.  What has to be done in case any of those symptoms occurs? [x]   YES  []   NO      C.  What are the main steps to perform a self-injection?  [x]   YES  []   NO  If NO, then report which step/s was/were missing:        []   Choose injection site (abdomen or thigh)       []   Twist cap off       []   Pinch skin and place the study autoinjector       []   Firmly push down and hold for 3 seconds       D. What has to be done immediately after an injection?  [x]   YES   []   NO          If NO, then report which step/s was/were missing:       []   Call  for emergency medical help       []   Show the autoinjector to the emergency medical responder       E.  Does the subject recall where s/he keeps/ stores the autoinjectors? [x]  YES  []  NO          Note the place of storage and any corrective explanation  if needed below __________________________________________________________________ __________________________________________________________________  - TRAINING REFRESHER   Q7:  Is a training refresher needed?      []  YES  [x]  NO        If YES, indicate items that have to be refreshed. More than one may apply:               []   Heart attack symptoms             []   Actions to be taken following heart attack symptoms             []   Steps to perform the self-injection and follow-up actions to be taken   []   Other, Specify  _________________________________________     Form Based on IDORSIA SOS-AMI_CRF_Version 5.0_27Oct2021 [id number], 62,            and eCRF  Version 5.0-  15-Dec-2019

## 2020-07-05 ENCOUNTER — Encounter: Payer: Self-pay | Admitting: *Deleted

## 2020-07-05 DIAGNOSIS — Z006 Encounter for examination for normal comparison and control in clinical research program: Secondary | ICD-10-CM

## 2020-07-05 NOTE — Research (Signed)
I have called the patient regarding 2 things, but had to leave a message:      1, Can we arrange for a courier to come and switch out his damaged autoinjector? The Sponsor has set up a special pharmacy courier to do this in lieu of the patient having to find transportation to our office.     2. The Sponsor is requesting information on whether the writing on the autoinjector label is wearing off or is still legible.   Hopefully Isaac Goodwin will either text or call me back on these items asap.

## 2020-07-06 ENCOUNTER — Other Ambulatory Visit: Payer: Self-pay

## 2020-07-07 ENCOUNTER — Other Ambulatory Visit: Payer: Self-pay

## 2020-07-07 MED FILL — Enalapril Maleate Tab 10 MG: ORAL | 30 days supply | Qty: 30 | Fill #1 | Status: AC

## 2020-07-07 MED FILL — Atorvastatin Calcium Tab 80 MG (Base Equivalent): ORAL | 30 days supply | Qty: 30 | Fill #1 | Status: AC

## 2020-07-07 MED FILL — Bupropion HCl Tab ER 12HR 150 MG: ORAL | 26 days supply | Qty: 53 | Fill #0 | Status: AC

## 2020-07-14 ENCOUNTER — Other Ambulatory Visit: Payer: Self-pay

## 2020-08-11 ENCOUNTER — Encounter: Payer: Self-pay | Admitting: *Deleted

## 2020-08-11 DIAGNOSIS — Z006 Encounter for examination for normal comparison and control in clinical research program: Secondary | ICD-10-CM

## 2020-08-11 NOTE — Research (Signed)
SOS-AMI autoinjector trial-  After numerous attempts to contact this patient regarding the exchange of his cold damaged autoinjector and follow up visits, I have sent the patient a certified letter asking him to contact us asap.

## 2020-08-18 ENCOUNTER — Other Ambulatory Visit: Payer: Self-pay

## 2020-08-18 MED FILL — Enalapril Maleate Tab 10 MG: ORAL | 30 days supply | Qty: 30 | Fill #2 | Status: AC

## 2020-08-18 MED FILL — Atorvastatin Calcium Tab 80 MG (Base Equivalent): ORAL | 30 days supply | Qty: 30 | Fill #2 | Status: AC

## 2020-08-22 ENCOUNTER — Other Ambulatory Visit: Payer: Self-pay

## 2020-10-03 ENCOUNTER — Other Ambulatory Visit: Payer: Self-pay | Admitting: Nurse Practitioner

## 2020-10-03 ENCOUNTER — Other Ambulatory Visit: Payer: Self-pay

## 2020-10-03 ENCOUNTER — Other Ambulatory Visit: Payer: Self-pay | Admitting: Cardiology

## 2020-10-03 DIAGNOSIS — F17200 Nicotine dependence, unspecified, uncomplicated: Secondary | ICD-10-CM

## 2020-10-03 MED FILL — Enalapril Maleate Tab 10 MG: ORAL | 30 days supply | Qty: 30 | Fill #3 | Status: AC

## 2020-10-03 MED FILL — Atorvastatin Calcium Tab 80 MG (Base Equivalent): ORAL | 30 days supply | Qty: 30 | Fill #3 | Status: AC

## 2020-10-03 NOTE — Telephone Encounter (Signed)
Requested medication (s) are due for refill today:  yes   Requested medication (s) are on the active medication list: yes   Last refill:  07/07/2020  Future visit scheduled: no  Notes to clinic:  overdue for follow up appointment  Message sent to patient to contact office    Requested Prescriptions  Pending Prescriptions Disp Refills   buPROPion (WELLBUTRIN SR) 150 MG 12 hr tablet 120 tablet 0    Sig: TAKE 1 TABLET DAILY FOR ONE WEEK WHILE DECREASING SMOKING. STOP SMOKING AND INCREASE TO 1 TABLET TWICE A DAY FOR 7 WEEKS.     Psychiatry: Antidepressants - bupropion Failed - 10/03/2020  2:43 PM      Failed - Valid encounter within last 6 months    Recent Outpatient Visits           6 months ago Abdominal aortic aneurysm (AAA) without rupture [facility])   [place] [facility] [place], Shea Stakes, NP   7 months ago Essential hypertension   [facility] Drucilla Chalet, RPH-CPP   8 months ago Encounter to establish care   [facility] [place], [place] W, NP              Passed - Last BP in normal range    BP Readings from Last 1 Encounters:  06/08/20 121/87

## 2020-10-04 ENCOUNTER — Other Ambulatory Visit: Payer: Self-pay

## 2020-10-05 ENCOUNTER — Other Ambulatory Visit: Payer: Self-pay

## 2020-10-06 ENCOUNTER — Other Ambulatory Visit: Payer: Self-pay

## 2020-10-06 ENCOUNTER — Telehealth: Payer: Self-pay | Admitting: Cardiovascular Disease

## 2020-10-06 DIAGNOSIS — I214 Non-ST elevation (NSTEMI) myocardial infarction: Secondary | ICD-10-CM

## 2020-10-06 NOTE — Telephone Encounter (Signed)
  *  STAT* If patient is at the pharmacy, call can be transferred to refill team.   1. Which medications need to be refilled? (please list name of each medication and dose if known)   ticagrelor (BRILINTA) 90 MG TABS tablet    2. Which pharmacy/location (including street and city if local pharmacy) is medication to be sent to? [facility]  3. Do they need a 30 day or 90 day supply? 90 days   Pt is out of meds and needs refill today

## 2020-10-09 ENCOUNTER — Encounter: Payer: Self-pay | Admitting: *Deleted

## 2020-10-09 ENCOUNTER — Other Ambulatory Visit: Payer: Self-pay

## 2020-10-09 DIAGNOSIS — Z006 Encounter for examination for normal comparison and control in clinical research program: Secondary | ICD-10-CM

## 2020-10-09 MED ORDER — TICAGRELOR 90 MG PO TABS
90.0000 mg | ORAL_TABLET | Freq: Two times a day (BID) | ORAL | 1 refills | Status: DC
  Filled 2020-10-09 – 2020-10-20 (×2): qty 180, 90d supply, fill #0

## 2020-10-09 NOTE — Research (Signed)
Still trying to get Isaac Goodwin to call me back regarding his autoinjector change out. I sent him a certified letter on 08-11-20 but I have not gotten the signature returned that anyone accepted it at the address listed in Epic.   His Visit 5 window is from 09-16-20 to 10-14-20 and I have tried to reach several times by both text and phone messages on his and his wife's phone numbers.                          [phone], Isaac Goodwin     FOLLOW-UP PHONE CALLS  SUBJECT ID:  [id number] Trainer's name: Eulogio Ditch, RN  Trainer's signature: on Delegation of Authority Log Date of the Follow up call:           [id number]  Visit Number: 5 Start time of the Follow up call:                 End time of call:    - CONTACT  Q1;  Was the follow up phone call done with the subject?  []   YES  []   NO           If NO, check the following:      Q2:    Was the follow up phone call done with a family member               Or caregiver?    []   YES   []   NO          Make note about reasons ___________________________________    - MEDICAL CONDITION      Q3:  Did any of the following occur?   []   Death       []   Hospitalization (any cause)         []   Use of autoinjector  Make note about the type of event, and when it occurred. In case of hospitalization: the Location of the hospital and/or the treating physician's contact details  ____________ ____________________________________________________________________ ____________________________________________________________________  Note: remember to report any SAE/AE which has occurred within 30 days after any  injection of the study drug on relevant eCRF forms.   Q4:  Did the subject develop any condition which is an          exclusion criterion?  []   YES  []   NO   Take note about the occurrence of any exclusion criterion after  randomization: _________________________________________________________________ __________________________________________________________________  Q5: Was there any change in subject's antithrombotic therapy?   []   YES  []   NO     Take not about any change of antithrombotic treatment: _______________________ ____________________________________________________________________ ____________________________________________________________________      OF THE STUDY-SPECFIC TRAINING  Q6: Did the subject correctly reply to the following questions?       A.  What are common heart attack symptoms?      []   YES   []   NO      B.  What has to be done in case any of those symptoms occurs? []   YES  []   NO      C.  What are the main steps to perform a self-injection?  []   YES  []   NO             If NO, then report which step/s was/were missing:        []   Choose injection site (abdomen or thigh)       []   Twist cap off       []   Pinch skin and place the study autoinjector       []   Firmly push down and hold for 3 seconds       D. What has to be done immediately after an injection?  []   YES   []   NO          If NO, then report which step/s was/were missing:       []   Call  for emergency medical help       []   Show the autoinjector to the emergency medical responder       E.  Does the subject recall where s/he keeps/ stores the autoinjectors? []  YES  []  NO          Note the place of storage and any corrective explanation if needed below __________________________________________________________________ __________________________________________________________________  - TRAINING REFRESHER   Q7:  Is a training refresher needed?      []  YES  []  NO        If YES, indicate items that have to be refreshed. More than one may apply:               []   Heart attack symptoms             []   Actions to be taken following heart attack symptoms             []   Steps to perform the  self-injection and follow-up actions to be taken   []   Other, Specify  _________________________________________     Form Based on IDORSIA SOS-AMI_CRF_Version 5.0_27Oct2021 [id number], 62,            and eCRF  Version 5.0-  15-Dec-2019

## 2020-10-16 ENCOUNTER — Other Ambulatory Visit: Payer: Self-pay

## 2020-10-20 ENCOUNTER — Other Ambulatory Visit: Payer: Self-pay

## 2020-11-24 ENCOUNTER — Other Ambulatory Visit: Payer: Self-pay | Admitting: Nurse Practitioner

## 2020-11-24 ENCOUNTER — Other Ambulatory Visit: Payer: Self-pay

## 2020-11-24 DIAGNOSIS — I214 Non-ST elevation (NSTEMI) myocardial infarction: Secondary | ICD-10-CM

## 2020-11-24 DIAGNOSIS — I714 Abdominal aortic aneurysm, without rupture, unspecified: Secondary | ICD-10-CM

## 2020-11-24 DIAGNOSIS — I10 Essential (primary) hypertension: Secondary | ICD-10-CM

## 2020-11-24 NOTE — Telephone Encounter (Signed)
Requested medications are due for refill today requesting early  Requested medications are on the active medication list yes  Last refill 10/06/20 for 90 day supply  Last visit 03/27/20, asked to return in May but did not.  Future visit scheduled no  Notes to clinic requesting early, was supposed to return in May and did not, no upcoming visit scheduled, please assess.

## 2020-11-28 ENCOUNTER — Other Ambulatory Visit: Payer: Self-pay

## 2020-11-30 ENCOUNTER — Other Ambulatory Visit: Payer: Self-pay

## 2020-12-01 ENCOUNTER — Other Ambulatory Visit: Payer: Self-pay

## 2020-12-08 ENCOUNTER — Other Ambulatory Visit: Payer: Self-pay

## 2020-12-08 ENCOUNTER — Other Ambulatory Visit: Payer: Self-pay | Admitting: Nurse Practitioner

## 2020-12-08 DIAGNOSIS — I10 Essential (primary) hypertension: Secondary | ICD-10-CM

## 2020-12-08 DIAGNOSIS — I714 Abdominal aortic aneurysm, without rupture, unspecified: Secondary | ICD-10-CM

## 2020-12-08 DIAGNOSIS — I214 Non-ST elevation (NSTEMI) myocardial infarction: Secondary | ICD-10-CM

## 2020-12-08 MED ORDER — ATORVASTATIN CALCIUM 80 MG PO TABS
ORAL_TABLET | Freq: Every day | ORAL | 1 refills | Status: DC
  Filled 2020-12-08: qty 30, 30d supply, fill #0
  Filled 2021-01-24: qty 30, 30d supply, fill #1

## 2020-12-08 MED ORDER — ENALAPRIL MALEATE 10 MG PO TABS
10.0000 mg | ORAL_TABLET | Freq: Every day | ORAL | 0 refills | Status: DC
  Filled 2020-12-08: qty 45, 45d supply, fill #0

## 2020-12-08 NOTE — Telephone Encounter (Signed)
Medication Refill - Medication:  enalapril (VASOTEC) 10 MG tablet  atorvastatin (LIPITOR) 80 MG tablet  metoprolol succinate (TOPROL-XL) 50 MG 24 hr tablet  Has the patient contacted their pharmacy? Yes.     Preferred Pharmacy (with phone number or street name):  [facility]  [address], [place] [address]  Phone:  [phone]  Fax:  [phone]  Has the patient been seen for an appointment in the last year OR does the patient have an upcoming appointment? Yes.    *Pt has appt 11/06, pt also states these are his heart medication and he has a aneurysm, and this is very urgent, he really needs this medication, please advise*  Agent: Please be advised that RX refills may take up to 3 business days. We ask that you follow-up with your pharmacy.

## 2020-12-08 NOTE — Telephone Encounter (Signed)
Courtesy refill given of Vasotec until appointment.   Requested Prescriptions  Pending Prescriptions Disp Refills  . enalapril (VASOTEC) 10 MG tablet 45 tablet 0    Sig: Take 1 tablet (10 mg total) by mouth daily. 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 rupture [facility])   [place] [facility] [place], Shea Stakes, NP   9 months ago Essential hypertension   [facility] Drucilla Chalet, RPH-CPP   10 months ago Encounter to establish care   [facility] Claiborne Rigg, NP      Future Appointments            In 1 month Storm Frisk, MD [facility]           Passed - Patient is not pregnant      Passed - Last BP in normal range    BP Readings from Last 1 Encounters:  06/08/20 121/87         . atorvastatin (LIPITOR) 80 MG tablet 90 tablet 1    Sig: TAKE 1 TABLET (80 MG TOTAL) BY MOUTH DAILY.     Cardiovascular:  Antilipid - Statins Failed - 12/08/2020 12:15 PM      Failed - HDL in normal range and within 360 days    HDL  Date Value Ref Range Status  04/26/2020 31 (L) >39 mg/dL Final         Passed - Total Cholesterol in normal range and within 360 days    Cholesterol, Total  Date Value Ref Range Status  04/26/2020 141 100 - 199 mg/dL Final         Passed - LDL in normal range and  within 360 days    LDL Chol Calc (NIH)  Date Value Ref Range Status  04/26/2020 88 0 - 99 mg/dL Final         Passed - Triglycerides in normal range and within 360 days    Triglycerides  Date Value Ref Range Status  04/26/2020 123 0 - 149 mg/dL Final         Passed - Patient is not pregnant      Passed - Valid encounter within last 12 months    Recent Outpatient Visits          8 months ago Abdominal aortic aneurysm (AAA) without rupture [facility])    [place] [facility] [place], Shea Stakes, NP   9 months ago Essential hypertension   [facility] [place], Cornelius Moras, RPH-CPP   10 months ago Encounter to establish care   [facility] Claiborne Rigg, NP      Future Appointments            In 1 month D[place] Charlcie Cradle, MD [facility]

## 2020-12-08 NOTE — Telephone Encounter (Signed)
Patient called, left VM to call the cardiologist office for the refill request of Metoprolol, since that is who refilled it last. Advised all others will be sent to provider for refill and to call the office if any questions about this.

## 2021-01-23 ENCOUNTER — Ambulatory Visit: Payer: Self-pay | Admitting: Critical Care Medicine

## 2021-01-23 NOTE — Progress Notes (Deleted)
s  Established Patient Office Visit  Subjective:  Patient ID: Isaac Goodwin, male    DOB: 10-Oct-1970  Age: 50 y.o. MRN: [id number]  CC: No chief complaint on file.   HPI Isaac Goodwin presents for  Pcp [place] med refill   Past Medical History:  Diagnosis Date   Coronary artery disease    Hypercholesteremia    Hypertension    Non-STEMI (non-ST elevated myocardial infarction) (HCC) 10/19/07   successful cutting balloon atherotomy/stenting cypher stent 3.0x79mm just beyond ostium prox. in the RCA. 10/23/07 cypher stents placed in LAD & CX   OSA (obstructive sleep apnea)    Sinus headache    Sleep apnea     Past Surgical History:  Procedure Laterality Date   CORONARY STENT PLACEMENT  10/23/07   successful PTCA/stenting of 90% LAD cypher stent 3.0x47mm taper of 3.4-3.62mm, left cx obtuse marginal 1 cypher stent 2.5x4mm    CORONARY STENT PLACEMENT  10/19/07   successful cutting balloon atherotomy/stenting cypher 3.0x69mm just beyond the ostium prox. in the RCA   CORONARY/GRAFT ACUTE MI REVASCULARIZATION N/A 02/28/2020   Procedure: Coronary/Graft Acute MI Revascularization;  Surgeon: Corky Crafts, MD;  Location: [facility] INVASIVE CV LAB;  Service: Cardiovascular;  Laterality: N/A;   LEFT HEART CATH AND CORONARY ANGIOGRAPHY N/A 02/28/2020   Procedure: LEFT HEART CATH AND CORONARY ANGIOGRAPHY;  Surgeon: Corky Crafts, MD;  Location: [facility] INVASIVE CV LAB;  Service: Cardiovascular;  Laterality: N/A;    Family History  Problem Relation Age of Onset   Diabetes Mother    Thyroid cancer Mother     Social History   Socioeconomic History   Marital status: Married    Spouse name: Not on file   Number of children: Not on file   Years of education: Not on file   Highest education level: Not on file  Occupational History   Not on file  Tobacco Use   Smoking status: Some Days    Packs/day: 0.50    Years: 10.00    Pack years: 5.00    Types: Cigarettes   Smokeless tobacco: Never    Tobacco comments:    3-4 cigarettes/day  Vaping Use   Vaping Use: Never used  Substance and Sexual Activity   Alcohol use: Not Currently   Drug use: No   Sexual activity: Yes  Other Topics Concern   Not on file  Social History Narrative   Not on file   Social Determinants of Health   Financial Resource Strain: Not on file  Food Insecurity: Not on file  Transportation Needs: Not on file  Physical Activity: Not on file  Stress: Not on file  Social Connections: Not on file  Intimate Partner Violence: Not on file    Outpatient Medications Prior to Visit  Medication Sig Dispense Refill   acetaminophen (TYLENOL) 500 MG tablet Take 1,000 mg by mouth every 6 (six) hours as needed for mild pain.     aspirin 81 MG EC tablet TAKE 1 TABLET (81 MG TOTAL) BY MOUTH DAILY. (Patient taking differently: Take 81 mg by mouth daily.) 90 tablet 1   atorvastatin (LIPITOR) 80 MG tablet TAKE 1 TABLET (80 MG TOTAL) BY MOUTH DAILY. 90 tablet 1   buPROPion (WELLBUTRIN SR) 150 MG 12 hr tablet TAKE 1 TABLET DAILY FOR ONE WEEK WHILE DECREASING SMOKING. STOP SMOKING AND INCREASE TO 1 TABLET TWICE A DAY FOR 7 WEEKS. (Patient taking differently: Take 150 mg by mouth daily.) 120 tablet 0   cholecalciferol (VITAMIN D3)  25 MCG (1000 UNIT) tablet Take 1,000 Units by mouth daily.     co-enzyme Q-10 30 MG capsule Take 30 mg by mouth daily.     enalapril (VASOTEC) 10 MG tablet Take 1 tablet (10 mg total) by mouth daily. OFFICE VISIT NEEDED FOR ADDITIONAL REFILLS 45 tablet 0   ezetimibe (ZETIA) 10 MG tablet Take 1 tablet (10 mg total) by mouth daily. 90 tablet 0   lidocaine (LIDODERM) 5 % PLACE 1 PATCH ONTO THE SKIN DAILY AS NEEDED (FOR BACK PAIN). (Patient taking differently: Place 1 patch onto the skin daily as needed (back pain).) 30 patch 0   methocarbamol (ROBAXIN) 500 MG tablet TAKE 2 TABLETS (1,000 MG TOTAL) BY MOUTH EVERY 6 (SIX) HOURS AS NEEDED FOR MUSCLE SPASMS. (Patient taking differently: Take 1,000 mg by mouth  every 6 (six) hours as needed for muscle spasms.) 90 tablet 0   metoprolol succinate (TOPROL-XL) 50 MG 24 hr tablet Take 1.5 tablets (75 mg total) by mouth daily. Take with or immediately following a meal. 135 tablet 3   nitroGLYCERIN (NITROSTAT) 0.4 MG SL tablet PLACE 1 TABLET (0.4 MG TOTAL) UNDER THE TONGUE EVERY FIVE MINUTES AS NEEDED. (Patient taking differently: Place 0.4 mg under the tongue every 5 (five) minutes as needed for chest pain.) 25 tablet 2   Study - SOS-AMI - selatogrel 16 mg/0.5 mL or placebo SQ injection (PI-Christopher) Inject 16 mg into the skin once. Inject 0.5 mL subcutaneously in the abdomen or thigh if you experience symptoms of a heart attack. Call 911 immediately  and seek emergency medical support.     ticagrelor (BRILINTA) 90 MG TABS tablet Take 1 tablet (90 mg total) by mouth 2 (two) times daily. 180 tablet 1   No facility-administered medications prior to visit.    Allergies  Allergen Reactions   Gabapentin Other (See Comments)    LOC changes- memory loss   Penicillins Anaphylaxis, Shortness Of Breath and Other (See Comments)    "Was told as a child that it caused breathing problems"     ROS Review of Systems    Objective:    Physical Exam  There were no vitals taken for this visit. Wt Readings from Last 3 Encounters:  06/08/20 260 lb 3.2 oz (118 kg)  05/03/20 260 lb (117.9 kg)  03/17/20 250 lb (113.4 kg)     Health Maintenance Due  Topic Date Due   Pneumococcal Vaccine 63-79 Years old (1 - PCV) Never done   Hepatitis C Screening  Never done   TETANUS/TDAP  Never done   Zoster Vaccines- Shingrix (1 of 2) Never done   COVID-19 Vaccine (3 - Pfizer risk series) 09/08/2019   INFLUENZA VACCINE  09/18/2020    There are no preventive care reminders to display for this patient.  Lab Results  Component Value Date   TSH 2.780 01/25/2020   Lab Results  Component Value Date   WBC 8.8 06/07/2020   HGB 15.7 06/07/2020   HCT 45.7 06/07/2020    MCV 93.3 06/07/2020   PLT 222 06/07/2020   Lab Results  Component Value Date   NA 142 06/07/2020   K 3.8 06/07/2020   CO2 25 06/07/2020   GLUCOSE 133 (H) 06/07/2020   BUN 21 (H) 06/07/2020   CREATININE 1.18 06/07/2020   BILITOT 0.5 04/26/2020   ALKPHOS 78 04/26/2020   AST 22 04/26/2020   ALT 36 04/26/2020   PROT 6.7 04/26/2020   ALBUMIN 4.4 04/26/2020   CALCIUM 9.2 06/07/2020  ANIONGAP 9 06/07/2020   Lab Results  Component Value Date   CHOL 141 04/26/2020   Lab Results  Component Value Date   HDL 31 (L) 04/26/2020   Lab Results  Component Value Date   LDLCALC 88 04/26/2020   Lab Results  Component Value Date   TRIG 123 04/26/2020   Lab Results  Component Value Date   CHOLHDL 4.5 04/26/2020   Lab Results  Component Value Date   HGBA1C 5.7 (H) 02/28/2020      Assessment & Plan:   Problem List Items Addressed This Visit   None   No orders of the defined types were placed in this encounter.   Follow-up: No follow-ups on file.    Shan Levans, MD

## 2021-01-24 ENCOUNTER — Other Ambulatory Visit: Payer: Self-pay | Admitting: Nurse Practitioner

## 2021-01-24 ENCOUNTER — Other Ambulatory Visit: Payer: Self-pay

## 2021-01-24 DIAGNOSIS — I10 Essential (primary) hypertension: Secondary | ICD-10-CM

## 2021-01-24 NOTE — Telephone Encounter (Signed)
Requested medication (s) are due for refill today: Yes  Requested medication (s) are on the active medication list: Yes  Last refill:  12/08/20 #45/0RF  Future visit scheduled: Yes  Notes to clinic:  Unable to refill per protocol due to failed labs, no updated results.      Requested Prescriptions  Pending Prescriptions Disp Refills   enalapril (VASOTEC) 10 MG tablet 45 tablet 0    Sig: Take 1 tablet (10 mg total) by mouth daily. OFFICE VISIT NEEDED FOR ADDITIONAL REFILLS     Cardiovascular:  ACE Inhibitors Failed - 01/24/2021  2:41 PM      Failed - Cr in normal range and within 180 days    Creat  Date Value Ref Range Status  06/24/2014 0.95 0.50 - 1.35 mg/dL Final   Creatinine, Ser  Date Value Ref Range Status  06/07/2020 1.18 0.61 - 1.24 mg/dL Final          Failed - K in normal range and within 180 days    Potassium  Date Value Ref Range Status  06/07/2020 3.8 3.5 - 5.1 mmol/L Final          Failed - Valid encounter within last 6 months    Recent Outpatient Visits           10 months ago Abdominal aortic aneurysm (AAA) without rupture [facility])   [place] [facility] Claiborne Rigg, NP   11 months ago Essential hypertension   [facility] Drucilla Chalet, RPH-CPP   1 year ago Encounter to establish care   [facility] Claiborne Rigg, NP       Future Appointments             In 4 weeks Anders Simmonds, PA-C [place] [facility]            Passed - Patient is not pregnant      Passed - Last BP in normal range    BP Readings from Last 1 Encounters:  06/08/20 121/87

## 2021-01-25 ENCOUNTER — Other Ambulatory Visit: Payer: Self-pay

## 2021-01-25 MED ORDER — ENALAPRIL MALEATE 10 MG PO TABS
10.0000 mg | ORAL_TABLET | Freq: Every day | ORAL | 0 refills | Status: DC
  Filled 2021-01-25: qty 30, 30d supply, fill #0

## 2021-01-26 ENCOUNTER — Encounter: Payer: Self-pay | Admitting: *Deleted

## 2021-01-26 DIAGNOSIS — Z006 Encounter for examination for normal comparison and control in clinical research program: Secondary | ICD-10-CM

## 2021-01-30 ENCOUNTER — Other Ambulatory Visit: Payer: Self-pay

## 2021-02-08 NOTE — Research (Signed)
I have tried to call the patient again today but have had no contact with the patient. After numerous attempts to contact patient (emails, phone calls, text messaging, certified letter), the decision was made to change this patient's status to "Lost to Follow-up". The Sponsor, Verdell Face, has been notified and the [facility] has been updated of this change.

## 2021-02-21 ENCOUNTER — Other Ambulatory Visit: Payer: Self-pay

## 2021-02-21 ENCOUNTER — Encounter: Payer: Self-pay | Admitting: Physician Assistant

## 2021-02-21 ENCOUNTER — Ambulatory Visit: Payer: Self-pay | Attending: Critical Care Medicine | Admitting: Physician Assistant

## 2021-02-21 VITALS — BP 127/86 | HR 73 | Resp 16 | Wt 252.8 lb

## 2021-02-21 DIAGNOSIS — E785 Hyperlipidemia, unspecified: Secondary | ICD-10-CM

## 2021-02-21 DIAGNOSIS — F17200 Nicotine dependence, unspecified, uncomplicated: Secondary | ICD-10-CM

## 2021-02-21 DIAGNOSIS — G4730 Sleep apnea, unspecified: Secondary | ICD-10-CM

## 2021-02-21 DIAGNOSIS — R7303 Prediabetes: Secondary | ICD-10-CM

## 2021-02-21 DIAGNOSIS — I10 Essential (primary) hypertension: Secondary | ICD-10-CM

## 2021-02-21 DIAGNOSIS — I214 Non-ST elevation (NSTEMI) myocardial infarction: Secondary | ICD-10-CM

## 2021-02-21 DIAGNOSIS — R41840 Attention and concentration deficit: Secondary | ICD-10-CM

## 2021-02-21 DIAGNOSIS — R413 Other amnesia: Secondary | ICD-10-CM

## 2021-02-21 DIAGNOSIS — I714 Abdominal aortic aneurysm, without rupture, unspecified: Secondary | ICD-10-CM

## 2021-02-21 DIAGNOSIS — U099 Post covid-19 condition, unspecified: Secondary | ICD-10-CM

## 2021-02-21 DIAGNOSIS — J069 Acute upper respiratory infection, unspecified: Secondary | ICD-10-CM

## 2021-02-21 DIAGNOSIS — R5383 Other fatigue: Secondary | ICD-10-CM

## 2021-02-21 MED ORDER — METOPROLOL SUCCINATE ER 50 MG PO TB24: 75.0000 mg | ORAL_TABLET | Freq: Every day | ORAL | 3 refills | Status: DC

## 2021-02-21 MED ORDER — EZETIMIBE 10 MG PO TABS: 10.0000 mg | ORAL_TABLET | Freq: Every day | ORAL | 0 refills | Status: DC

## 2021-02-21 MED ORDER — ATORVASTATIN CALCIUM 80 MG PO TABS: ORAL_TABLET | Freq: Every day | ORAL | 1 refills | Status: DC

## 2021-02-21 MED ORDER — AZITHROMYCIN 250 MG PO TABS: ORAL_TABLET | ORAL | 0 refills | Status: AC

## 2021-02-21 MED ORDER — ENALAPRIL MALEATE 10 MG PO TABS: 10.0000 mg | ORAL_TABLET | Freq: Every day | ORAL | 0 refills | Status: DC

## 2021-02-21 NOTE — Progress Notes (Signed)
Patient ID: Isaac Goodwin, male   DOB: August 02, 1970, 51 y.o.   MRN: [id number]   Isaac Goodwin, is a 51 y.o. male  [id number]  [id number]  DOB - January 26, 1971  Chief Complaint  Patient presents with   Medication Refill       Subjective:   Isaac Goodwin is a 51 y.o. male here today for multiple issues.  Needs new sleep study bc cpap machine broken for about 2 years.    He had covid at thanksgiving(about 6 weeks ago) and continues to have some cough and congestion.  He also has severe fatigue. 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    His daughter and wife also had covid and daughter had ITP.    Patient has No headache, No chest pain, No abdominal pain - No Nausea, No new weakness tingling or numbness, No Cough - SOB. No abdominal pain.    No problems updated.  ALLERGIES: Allergies  Allergen Reactions   Gabapentin Other (See Comments)    LOC changes- memory loss   Penicillins Anaphylaxis, Shortness Of Breath and Other (See Comments)    "Was told as a child that it caused breathing problems"     PAST MEDICAL HISTORY: Past Medical History:  Diagnosis Date   Coronary artery disease    Hypercholesteremia    Hypertension    Non-STEMI (non-ST elevated myocardial infarction) ([place]) 10/19/07   successful cutting balloon atherotomy/stenting cypher stent 3.0x39mm just beyond ostium prox. in the RCA. 10/23/07 cypher stents placed in LAD & CX   OSA (obstructive sleep apnea)    Sinus headache    Sleep apnea     MEDICATIONS AT HOME: Prior to Admission medications   Medication Sig Start Date End Date Taking? Authorizing Provider  acetaminophen (TYLENOL) 500 MG tablet Take 1,000 mg by mouth every 6 (six) hours as needed for mild pain.   Yes [provider]  aspirin 81 MG EC tablet TAKE 1 TABLET (81 MG TOTAL) BY MOUTH DAILY. Patient taking differently: Take 81 mg by mouth daily. 03/01/20 03/01/21 Yes Cheryln Manly, NP  atorvastatin (LIPITOR) 80 MG tablet TAKE 1 TABLET (80 MG TOTAL) BY MOUTH DAILY. 12/08/20 12/08/21 Yes [name], NP  azithromycin (ZITHROMAX) 250 MG tablet Take 2 tablets on day 1, then 1 tablet daily on days 2 through 5 02/21/21 02/26/21 Yes Eleana Tocco, Dionne Bucy, PA-C  cholecalciferol (VITAMIN D3) 25 MCG (1000 UNIT) tablet Take 1,000 Units by mouth daily.   Yes [provider]  co-enzyme Q-10 30 MG capsule Take 30 mg by mouth daily.   Yes [provider]  nitroGLYCERIN (NITROSTAT) 0.4 MG SL tablet PLACE 1 TABLET (0.4 MG TOTAL) UNDER THE TONGUE EVERY FIVE MINUTES AS NEEDED. Patient taking differently: Place 0.4 mg under the tongue every 5 (five) minutes as needed for chest pain. 03/01/20 03/01/21 Yes Cheryln Manly, NP  Study - SOS-AMI - selatogrel 16 mg/0.5 mL or placebo SQ injection (PI-[facility]) Inject 16 mg into the skin once. Inject 0.5 mL subcutaneously in the abdomen or thigh if you experience symptoms of a heart attack. Call 911 immediately  and seek emergency medical support. 03/14/20  Yes Buford Dresser, MD  ticagrelor (BRILINTA) 90 MG TABS tablet Take 1 tablet (90 mg total) by mouth 2 (two) times daily. 10/09/20  Yes [name], NP  enalapril (VASOTEC) 10 MG tablet Take 1 tablet (10 mg total) by mouth daily. Must keep  upcoming office visit for refills 02/21/21   Argentina Donovan, PA-C  ezetimibe (ZETIA) 10 MG tablet Take 1 tablet (10 mg total) by mouth daily. 05/02/20 07/31/20  Almyra Deforest, PA  methocarbamol (ROBAXIN) 500 MG tablet TAKE 2 TABLETS (1,000 MG TOTAL) BY MOUTH EVERY 6 (SIX) HOURS AS NEEDED FOR MUSCLE SPASMS. Patient not taking: Reported on 02/21/2021 04/27/20 04/27/21  Charlott Rakes, MD  metoprolol succinate (TOPROL-XL) 50 MG 24 hr tablet Take 1.5 tablets (75 mg total) by mouth daily. Take with or immediately following a meal. 02/21/21 05/22/21  Destiney Sanabia, Dionne Bucy, PA-C    ROS: Neg HEENT Neg cardiac Neg GI Neg GU Neg MS Neg psych Neg  neuro  Objective:   Vitals:   02/21/21 1431  BP: 127/86  Pulse: 73  Resp: 16  SpO2: 94%  Weight: 252 lb 12.8 oz (114.7 kg)   Exam General appearance : Awake, alert, not in any distress. Speech Clear. Not toxic looking HEENT: Atraumatic and Normocephalic Neck: Supple, no JVD. No cervical lymphadenopathy.  Chest: Good air entry bilaterally, CTAB.  No rales/rhonchi/wheezing CVS: S1 S2 regular, no murmurs.  Extremities: B/L Lower Ext shows no edema, both legs are warm to touch Neurology: Awake alert, and oriented X 3, CN II-XII intact, Non focal Skin: No Rash  Data Review Lab Results  Component Value Date   HGBA1C 5.7 (H) 02/28/2020   HGBA1C 5.7 (A) 01/25/2020   HGBA1C 5.8 (H) 06/24/2014    Assessment & Plan   1. Essential hypertension controlled - Comprehensive metabolic panel - CBC with Differential/Platelet - enalapril (VASOTEC) 10 MG tablet; Take 1 tablet (10 mg total) by mouth daily. Must keep upcoming office visit for refills  Dispense: 30 tablet; Refill: 0 - metoprolol succinate (TOPROL-XL) 50 MG 24 hr tablet; Take 1.5 tablets (75 mg total) by mouth daily. Take with or immediately following a meal.  Dispense: 135 tablet; Refill: 3  2. Prediabetes I have had a lengthy discussion and provided education about insulin resistance and the intake of too much sugar/refined carbohydrates.  I have advised the patient to work at a goal of eliminating sugary drinks, candy, desserts, sweets, refined sugars, processed foods, and white carbohydrates.  The patient expresses understanding.  - Hemoglobin A1c  3. Hyperlipidemia with target LDL less than 70 Continue zetia and atorvastatin - Lipid panel  4. Tobacco dependence Smoking and dangers of nicotine have been discussed at length. Long term health consequences of smoking reviewed in detail.  Methods for helping with cessation have been reviewed.  Patient expresses understanding.   5. Other fatigue - Thyroid Panel With TSH -  Split night study; Future  6. 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 Acute non-ST segment elevation myocardial infarction ([facility]) Followed by cardiology-on brillinta  9. Upper respiratory tract infection, unspecified type Will cover for atypicals  - azithromycin (ZITHROMAX) 250 MG tablet; Take 2 tablets on day 1, then 1 tablet daily on days 2 through 5  Dispense: 6 tablet; Refill: 0  10. Sleep apnea, unspecified type - Split night study; Future    Patient have been counseled extensively about nutrition and exercise. Other issues discussed during this visit include: low cholesterol diet, weight control and daily exercise, foot care, annual eye examinations at Ophthalmology, importance of adherence with medications and regular follow-up. We also discussed long term complications of uncontrolled diabetes and hypertension.   Return in about 2 months (around 04/21/2021) for  with PCP for chronic conditions.  The patient was given clear instructions to go to ER or return to medical center if symptoms don't improve, worsen or new problems develop. The patient verbalized understanding. The patient was told to call to get lab results if they haven't heard anything in the next week.      Freeman Caldron, PA-C [facility] [place] [place]   02/21/2021, 2:49 PM

## 2021-03-02 ENCOUNTER — Other Ambulatory Visit: Payer: Self-pay | Admitting: Nurse Practitioner

## 2021-03-02 ENCOUNTER — Other Ambulatory Visit: Payer: Self-pay

## 2021-03-02 DIAGNOSIS — I714 Abdominal aortic aneurysm, without rupture, unspecified: Secondary | ICD-10-CM

## 2021-03-02 DIAGNOSIS — I214 Non-ST elevation (NSTEMI) myocardial infarction: Secondary | ICD-10-CM

## 2021-03-02 DIAGNOSIS — I10 Essential (primary) hypertension: Secondary | ICD-10-CM

## 2021-03-02 MED ORDER — METOPROLOL SUCCINATE ER 50 MG PO TB24
75.0000 mg | ORAL_TABLET | Freq: Every day | ORAL | 3 refills | Status: DC
  Filled 2021-03-02: qty 135, 90d supply, fill #0

## 2021-03-02 MED ORDER — ENALAPRIL MALEATE 10 MG PO TABS
10.0000 mg | ORAL_TABLET | Freq: Every day | ORAL | 2 refills | Status: DC
  Filled 2021-03-02: qty 30, 30d supply, fill #0

## 2021-03-02 MED ORDER — ATORVASTATIN CALCIUM 80 MG PO TABS
ORAL_TABLET | Freq: Every day | ORAL | 2 refills | Status: DC
  Filled 2021-03-02: qty 30, 30d supply, fill #0

## 2021-03-02 NOTE — Telephone Encounter (Signed)
Per refill history- medications were filled and sent to [facility] recently. Left message for Patient to call office to verify Rx needed and pharmacy he is using.  °

## 2021-03-02 NOTE — Telephone Encounter (Signed)
Per refill history- medications were filled and [facility]. Left message for Patient to call office to verify Rx needed and pharmacy he is using.

## 2021-03-05 ENCOUNTER — Other Ambulatory Visit: Payer: Self-pay

## 2021-03-07 ENCOUNTER — Other Ambulatory Visit: Payer: Self-pay

## 2021-03-07 ENCOUNTER — Encounter (HOSPITAL_COMMUNITY): Payer: Self-pay | Admitting: *Deleted

## 2021-03-07 ENCOUNTER — Emergency Department (HOSPITAL_COMMUNITY)
Admission: EM | Admit: 2021-03-07 | Discharge: 2021-03-08 | Disposition: A | Discharge status: Home / Self Care | Payer: Medicaid Other | Attending: Emergency Medicine | Admitting: Emergency Medicine

## 2021-03-07 DIAGNOSIS — Z7982 Long term (current) use of aspirin: Secondary | ICD-10-CM | POA: Diagnosis not present

## 2021-03-07 DIAGNOSIS — Z8616 Personal history of COVID-19: Secondary | ICD-10-CM | POA: Insufficient documentation

## 2021-03-07 DIAGNOSIS — R0789 Other chest pain: Secondary | ICD-10-CM | POA: Diagnosis present

## 2021-03-07 DIAGNOSIS — R059 Cough, unspecified: Secondary | ICD-10-CM | POA: Insufficient documentation

## 2021-03-07 DIAGNOSIS — Z20822 Contact with and (suspected) exposure to covid-19: Secondary | ICD-10-CM | POA: Insufficient documentation

## 2021-03-07 DIAGNOSIS — R0602 Shortness of breath: Secondary | ICD-10-CM | POA: Insufficient documentation

## 2021-03-07 LAB — CBC WITH DIFFERENTIAL/PLATELET
Abs Immature Granulocytes: 0.01 10*3/uL (ref 0.00–0.07)
Basophils Absolute: 0.1 10*3/uL (ref 0.0–0.1)
Basophils Relative: 1 %
Eosinophils Absolute: 0.3 10*3/uL (ref 0.0–0.5)
Eosinophils Relative: 4 %
HCT: 48.6 % (ref 39.0–52.0)
Hemoglobin: 16.9 g/dL (ref 13.0–17.0)
Immature Granulocytes: 0 %
Lymphocytes Relative: 33 %
Lymphs Abs: 2.6 10*3/uL (ref 0.7–4.0)
MCH: 31.7 pg (ref 26.0–34.0)
MCHC: 34.8 g/dL (ref 30.0–36.0)
MCV: 91.2 fL (ref 80.0–100.0)
Monocytes Absolute: 0.6 10*3/uL (ref 0.1–1.0)
Monocytes Relative: 8 %
Neutro Abs: 4.3 10*3/uL (ref 1.7–7.7)
Neutrophils Relative %: 54 %
Platelets: 225 10*3/uL (ref 150–400)
RBC: 5.33 MIL/uL (ref 4.22–5.81)
RDW: 12.9 % (ref 11.5–15.5)
WBC: 7.9 10*3/uL (ref 4.0–10.5)
nRBC: 0 % (ref 0.0–0.2)

## 2021-03-07 LAB — COMPREHENSIVE METABOLIC PANEL
ALT: 30 U/L (ref 0–44)
AST: 19 U/L (ref 15–41)
Albumin: 3.9 g/dL (ref 3.5–5.0)
Alkaline Phosphatase: 55 U/L (ref 38–126)
Anion gap: 9 (ref 5–15)
BUN: 17 mg/dL (ref 6–20)
CO2: 26 mmol/L (ref 22–32)
Calcium: 9.6 mg/dL (ref 8.9–10.3)
Chloride: 105 mmol/L (ref 98–111)
Creatinine, Ser: 1.15 mg/dL (ref 0.61–1.24)
GFR, Estimated: >60 mL/min (ref >60)
Glucose, Bld: 139 mg/dL — ABNORMAL HIGH (ref 70–99)
Potassium: 3.5 mmol/L (ref 3.5–5.1)
Sodium: 140 mmol/L (ref 135–145)
Total Bilirubin: 0.6 mg/dL (ref 0.3–1.2)
Total Protein: 6.7 g/dL (ref 6.5–8.1)

## 2021-03-07 LAB — TROPONIN I (HIGH SENSITIVITY): Troponin I (High Sensitivity): 7 ng/L (ref <18)

## 2021-03-07 MED ORDER — LORAZEPAM 1 MG PO TABS
1.0000 mg | ORAL_TABLET | Freq: Once | ORAL | Status: AC
Start: 2021-03-07 — End: 2021-03-07
  Administered 2021-03-07: 1 mg via ORAL
  Filled 2021-03-07: qty 1

## 2021-03-07 NOTE — ED Triage Notes (Signed)
The pt has had some abd pain and some sob  and chest discomfort

## 2021-03-07 NOTE — ED Provider Triage Note (Signed)
Emergency Medicine Provider Triage Evaluation Note  Isaac Goodwin , a 51 y.o. male  was evaluated in triage.  Pt complains of chest tightness and shortness of breath.  Patient reports a history of MI as well as thoracic aortic aneurysm.  He states that he had COVID-19 in November of last year and since then has been experiencing more frequent occurrences of chest tightness and shortness of breath.  These also occur when he has increasing stress.  He states over the last 24 hours he has been dealing with more stress than normal.  Around 7 PM acutely worsened and he took a dose of aspirin which mildly improved his symptoms and he came to the emergency department.  Denies any nausea or vomiting.  States that he smokes about 1/2 pack/day.    Physical Exam  BP (!) 143/101 (BP Location: Right Arm)    Pulse 80    Temp 98 F (36.7 C) (Oral)    Resp (!) 22    Ht 6\' 5"  (1.956 m)    Wt 114.7 kg    SpO2 96%    BMI 29.99 kg/m  Gen:   Awake, no distress   Resp:  Normal effort  MSK:   Moves extremities without difficulty  Other:    Medical Decision Making  Medically screening exam initiated at 11:08 PM.  Appropriate orders placed.  Isaac Goodwin was informed that the remainder of the evaluation will be completed by another provider, this initial triage assessment does not replace that evaluation, and the importance of remaining in the ED until their evaluation is complete.  Will obtain a basic cardiac work-up.  Patient declined the chest x-ray.   [name], PA-C 03/07/21 2310

## 2021-03-08 ENCOUNTER — Emergency Department (HOSPITAL_COMMUNITY): Payer: Medicaid Other

## 2021-03-08 LAB — D-DIMER, QUANTITATIVE: D-Dimer, Quant: <0.27 ug/mL-FEU (ref 0.00–0.50)

## 2021-03-08 LAB — BRAIN NATRIURETIC PEPTIDE: B Natriuretic Peptide: 36.3 pg/mL (ref 0.0–100.0)

## 2021-03-08 LAB — RESP PANEL BY RT-PCR (FLU A&B, COVID) ARPGX2
Influenza A by PCR: NEGATIVE
Influenza B by PCR: NEGATIVE
SARS Coronavirus 2 by RT PCR: NEGATIVE

## 2021-03-08 LAB — TROPONIN I (HIGH SENSITIVITY): Troponin I (High Sensitivity): 6 ng/L (ref <18)

## 2021-03-08 MED ORDER — HYDROXYZINE HCL 25 MG PO TABS
25.0000 mg | ORAL_TABLET | Freq: Once | ORAL | Status: AC
Start: 2021-03-08 — End: 2021-03-08
  Administered 2021-03-08: 25 mg via ORAL
  Filled 2021-03-08: qty 1

## 2021-03-08 MED ORDER — HYDROXYZINE HCL 25 MG PO TABS: 25.0000 mg | ORAL_TABLET | Freq: Four times a day (QID) | ORAL | 0 refills | Status: DC | PRN
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
## 2021-03-08 NOTE — ED Notes (Signed)
Patient transported to X-ray 

## 2021-03-08 NOTE — ED Notes (Signed)
Patient verbalizes understanding of discharge instructions. Prescriptions and follow-up care reviewed. Opportunity for questioning and answers were provided. Armband removed by staff, pt discharged from ED ambulatory.  

## 2021-03-12 ENCOUNTER — Other Ambulatory Visit: Payer: Self-pay

## 2021-03-20 ENCOUNTER — Other Ambulatory Visit: Payer: Self-pay

## 2021-03-30 ENCOUNTER — Encounter (HOSPITAL_BASED_OUTPATIENT_CLINIC_OR_DEPARTMENT_OTHER): Payer: Self-pay | Admitting: Internal Medicine

## 2021-04-01 ENCOUNTER — Encounter (HOSPITAL_BASED_OUTPATIENT_CLINIC_OR_DEPARTMENT_OTHER): Payer: Self-pay | Admitting: Internal Medicine

## 2021-04-06 ENCOUNTER — Other Ambulatory Visit: Payer: Self-pay | Admitting: Surgery

## 2021-04-06 DIAGNOSIS — I712 Thoracic aortic aneurysm, without rupture, unspecified: Secondary | ICD-10-CM

## 2021-04-23 ENCOUNTER — Ambulatory Visit (HOSPITAL_BASED_OUTPATIENT_CLINIC_OR_DEPARTMENT_OTHER): Payer: Medicaid Other | Admitting: Internal Medicine

## 2021-05-08 ENCOUNTER — Other Ambulatory Visit: Payer: Self-pay

## 2021-05-08 ENCOUNTER — Ambulatory Visit
Admission: RE | Admit: 2021-05-08 | Discharge: 2021-05-08 | Disposition: A | Discharge status: Home / Self Care | Payer: Medicaid Other | Source: Ambulatory Visit | Attending: Surgery | Admitting: Surgery

## 2021-05-08 ENCOUNTER — Ambulatory Visit: Payer: Medicaid Other | Admitting: Physician Assistant

## 2021-05-08 DIAGNOSIS — I712 Thoracic aortic aneurysm, without rupture, unspecified: Secondary | ICD-10-CM | POA: Diagnosis not present

## 2021-05-08 MED ORDER — IOPAMIDOL (ISOVUE-370) INJECTION 76%
75.0000 mL | Freq: Once | INTRAVENOUS | Status: AC | PRN
  Administered 2021-05-08: 75 mL via INTRAVENOUS

## 2021-05-08 NOTE — Progress Notes (Signed)
? ?   ?[address] ?      [place] [id number] ?            [id number]   ? ?  ?History of Present Illness: ?  ?The patient is a 51 year old gentleman with a history of hypertension, hyperlipidemia, OSA, and coronary artery disease status post non-ST segment elevation MI in 2009 treated with stenting of the RCA, LAD and left circumflex, as well as PCI of the left circumflex system with DES to the mid left circumflex, angioplasty of a third marginal branch, and DES of the second marginal branch in January of 2022.  A follow up 2D echocardiogram showed an ejection fraction of 50 to 55%.  The aortic valve was trileaflet without stenosis or regurgitation.  Aortic dilatation was noted with an ascending aortic diameter of 37 mm.  He had a CT angio PE study of the chest on 03/18/2020 which showed no evidence of pulmonary embolism but showed a 4.4 cm ascending aorta.  On retrospect review of an older CT of the chest with contrast on 01/11/2020 he was noted to have ascending aortic dilation measured 3.9 cm in maximum diameter.  Given these findings, he was referred to [place] for evaluation and this. ?Mr. Pennella was seen by Dr. Lavinia Sharps about 1 year ago.  Repeat CT imaging at that time showed stability of the ascending aortic dilation.  Careful blood pressure management, duration, and minimization of cardiovascular risk factors were recommended to him and he was asked to return in 1 year for follow-up CTA. ?  ?He was seen in the emergency room in January of this year for chest pain.  He ruled out for acute coronary syndrome.  He mentioned that he had multiple "stressors" in his life at that time including health issues with his wife and daughter. ? ?The patient continues to smoke 3 to 4 cigarettes a day.  He denies any chest or back pain. ? ? ?Current Outpatient Medications  ?Medication Sig Dispense Refill  ? acetaminophen (TYLENOL) 500 MG tablet Take 1,000 mg by mouth every 6 (six) hours as needed for mild pain.     ? aspirin EC 81 MG tablet Take 81 mg by mouth daily. Swallow whole.    ? atorvastatin (LIPITOR) 80 MG tablet TAKE 1 TABLET (80 MG TOTAL) BY MOUTH DAILY. (Patient taking differently: Take 80 mg by mouth daily.) 30 tablet 2  ? cholecalciferol (VITAMIN D3) 25 MCG (1000 UNIT) tablet Take 1,000 Units by mouth daily.    ? enalapril (VASOTEC) 10 MG tablet Take 1 tablet (10 mg total) by mouth daily. 30 tablet 2  ? ezetimibe (ZETIA) 10 MG tablet Take 1 tablet (10 mg total) by mouth daily. (Patient not taking: Reported on 03/08/2021) 90 tablet 0  ? hydrOXYzine (ATARAX) 25 MG tablet Take 1 tablet (25 mg total) by mouth every 6 (six) hours as needed. 12 tablet 0  ? lidocaine (LIDODERM) 5 % Place 1 patch onto the skin daily as needed (pain).    ? metoprolol succinate (TOPROL-XL) 50 MG 24 hr tablet Take 1.5 tablets (75 mg total) by mouth daily. Take with or immediately following a meal. 135 tablet 3  ? nitroGLYCERIN (NITROSTAT) 0.4 MG SL tablet PLACE 1 TABLET (0.4 MG TOTAL) UNDER THE TONGUE EVERY FIVE MINUTES AS NEEDED. (Patient taking differently: Place 0.4 mg under the tongue every 5 (five) minutes as needed for chest pain.) 25 tablet 2  ? Study - SOS-AMI - selatogrel 16 mg/0.5 mL  or placebo SQ injection (PI-Christopher) Inject 16 mg into the skin once. Inject 0.5 mL subcutaneously in the abdomen or thigh if you experience symptoms of a heart attack. Call 911 immediately  and seek emergency medical support.    ? ticagrelor (BRILINTA) 90 MG TABS tablet Take 1 tablet (90 mg total) by mouth 2 (two) times daily. 180 tablet 1  ? ?No current facility-administered medications for this visit.  ? ? ?Physical Exam: ?Vital signs ?BP 154/94 ?Pulse 64 ?Respirations 20 ?SPO2 97% ? ? ? ?Diagnostic Tests: ?CLINICAL DATA:  Aortic aneurysm, known or suspected ?  ?EXAM: ?CT ANGIOGRAPHY CHEST WITH CONTRAST ?  ?TECHNIQUE: ?Multidetector CT imaging of the chest was performed using the ?standard protocol during bolus administration of  intravenous ?contrast. Multiplanar CT image reconstructions and MIPs were ?obtained to evaluate the vascular anatomy. ?  ?RADIATION DOSE REDUCTION: This exam was performed according to the ?departmental dose-optimization program which includes automated ?exposure control, adjustment of the mA and/or kV according to ?patient size and/or use of iterative reconstruction technique. ?  ?CONTRAST:  40mL ISOVUE-370 IOPAMIDOL (ISOVUE-370) INJECTION 76% ?  ?COMPARISON:  January 2022 ?  ?FINDINGS: ?Cardiovascular: Preferential opacification of the thoracic aorta. ?The ascending thoracic aorta measures 4.1 x 4.1 cm, similar to ?previous exam when measured in a similar fashion. Note is made of ?independent origin of the left vertebral artery directly from the ?aortic arch. The main pulmonary artery is normal in caliber. Normal ?heart size. No pericardial effusion. ?  ?Mediastinum/Nodes: No enlarged mediastinal, hilar, or axillary lymph ?nodes. The thyroid gland appears normal. ?  ?Lungs/Pleura: No pleural effusion. No pneumothorax. Scattered areas ?of scarring most prominent in the right middle lobe and lingula ?appear stable. No suspicious pulmonary nodules. ?  ?Musculoskeletal: No aggressive osseous lesions. ?  ?Upper abdomen: There is a 2 cm arterial enhancing focus in the left ?hepatic lobe. ?  ?Review of the MIP images confirms the above findings. ?  ?IMPRESSION: ?1. Ascending thoracic aortic aneurysm measures up to 4.1 cm, similar ?to previous exam. ?2. There is a 2 cm arterially enhancing lesion in the left hepatic ?lobe. 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 ?If clinically appropriate, an MRI liver protocol could be performed ?for further evaluation. ?  ?  ?Electronically Signed ?  By: Olive Bass M.D. ?  On: 05/08/2021 10:56 ? ?Impression / Plan: ?51 year old gentleman with multiple cardiovascular risk factors including tobacco  use, hypertension, and dyslipidemia who is has a history of myocardial infarction and multiple percutaneous coronary interventions was incidentally found to have dilation of the ascending aorta about 1 year ago.  This remains asymptomatic.  Repeat CT angiography obtained today shows stability of the ascending aortic dimensions in all planes.  Careful blood pressure management, minimization of cardiovascular risk factors, and ongoing annual surveillance are recommended.  His blood pressure is elevated at the time of his office visit.  He tells me he did not take his blood pressure medications this morning.  Plan for follow-up CTA and return visit in 1 year. ? ? ?Leary Roca, PA-C ?[facility] ?[phone] ? ?

## 2021-05-08 NOTE — Patient Instructions (Signed)
Stop smoking ? ?Maintain careful control of the blood pressure with a target of less than 130/80. ? ?Avoid lifting greater than 35 pounds ? ?Follow-up in 1 year with chest CTA ?

## 2021-05-09 ENCOUNTER — Other Ambulatory Visit: Payer: Self-pay | Admitting: Physician Assistant

## 2021-05-09 DIAGNOSIS — I10 Essential (primary) hypertension: Secondary | ICD-10-CM

## 2021-06-01 ENCOUNTER — Encounter (HOSPITAL_BASED_OUTPATIENT_CLINIC_OR_DEPARTMENT_OTHER): Payer: Medicaid Other | Admitting: Internal Medicine

## 2021-07-09 ENCOUNTER — Ambulatory Visit (HOSPITAL_BASED_OUTPATIENT_CLINIC_OR_DEPARTMENT_OTHER): Payer: Medicaid Other | Admitting: Internal Medicine

## 2021-08-07 ENCOUNTER — Ambulatory Visit (HOSPITAL_BASED_OUTPATIENT_CLINIC_OR_DEPARTMENT_OTHER): Payer: Medicaid Other | Attending: Physician Assistant | Admitting: Internal Medicine

## 2021-08-07 DIAGNOSIS — G4733 Obstructive sleep apnea (adult) (pediatric): Secondary | ICD-10-CM | POA: Diagnosis not present

## 2021-08-07 DIAGNOSIS — R5383 Other fatigue: Secondary | ICD-10-CM | POA: Diagnosis present

## 2021-08-07 DIAGNOSIS — G4730 Sleep apnea, unspecified: Secondary | ICD-10-CM

## 2021-08-08 ENCOUNTER — Other Ambulatory Visit: Payer: Self-pay | Admitting: Pharmacist

## 2021-08-08 DIAGNOSIS — I10 Essential (primary) hypertension: Secondary | ICD-10-CM

## 2021-08-08 MED ORDER — ENALAPRIL MALEATE 10 MG PO TABS: 10.0000 mg | ORAL_TABLET | Freq: Every day | ORAL | 0 refills | Status: DC

## 2021-08-11 DIAGNOSIS — G4733 Obstructive sleep apnea (adult) (pediatric): Secondary | ICD-10-CM | POA: Diagnosis not present

## 2021-08-11 DIAGNOSIS — R5383 Other fatigue: Secondary | ICD-10-CM | POA: Diagnosis not present

## 2021-09-19 ENCOUNTER — Other Ambulatory Visit: Payer: Self-pay

## 2021-09-19 ENCOUNTER — Other Ambulatory Visit: Payer: Self-pay | Admitting: Pharmacist

## 2021-09-19 MED ORDER — BLOOD PRESSURE KIT DEVI: 0 refills | Status: AC

## 2021-09-19 NOTE — Progress Notes (Signed)
Patient appearing on report for [employer] [facility] - Hypertension Control report due to last documented ambulatory blood pressure of 165/111 mmHg on 05/08/2021. Next appointment with PCP is not yet scheduled.    Outreached patient to discuss hypertension control and medication management.   Current antihypertensives: enalapril 10 mg (has been on this medication ~15 years), metoprolol succinate 75 mg BID -Endorses using a pill box -Has previously gone a few days without medications and was previously only taking 50 mg BID of metoprolol   Patient does not have an automated upper arm home BP machine.  Patient denies hypotensive signs and symptoms including dizziness, lightheadedness.  Patient denies hypertensive symptoms including headache, chest pain, shortness of breath.  Patient denies side effects related to lisinopril or metoprolol   Assessment/Plan: HTN - Currently uncontrolled based on last clinic BP - Consider increasing enalapril to 20 mg daily (tolerated enalapril 10 mg for >10 years, per patient).  - Reviewed goal blood pressure <130/80 - Reviewed appropriate administration of medication regimen - Reviewed appropriate home BP monitoring technique (avoid caffeine, smoking, and exercise for 30 minutes before checking, rest for at least 5 minutes before taking BP, sit with feet flat on the floor and back against a hard surface, uncross legs, and rest arm on flat surface) - Reviewed to check blood pressure once daily, document, and provide at next provider visit - Discussed insurance coverage for BP monitor. Collaborated with provider to place order for BP monitor.  - Recommended patient schedule PCP appointment  2. Smoking cessation - Patient currently smoking 1 PPD, smokes [employer].  -Has used 1-800-QUITNOW in the past without success. -Previously used nicotine gum and had mouth irritation. -Interested in trying nicotine patches. Recommended patient start on 21 mg patches  daily. -He is not ready to set a quit date today.  Valeda Malm, Pharm.D. PGY-2 Ambulatory Care Pharmacy Resident 09/19/2021 10:55 AM

## 2021-10-13 IMAGING — CR DG CHEST 2V
2 series · 2 of 2 positions shown · non-contrast
Comparison: Chest CT January 11, 2020 and chest radiograph
January 12, 2020

CLINICAL DATA: Follow-up left-sided pneumothorax .

EXAM:
CHEST - 2 VIEW

[chest pa]
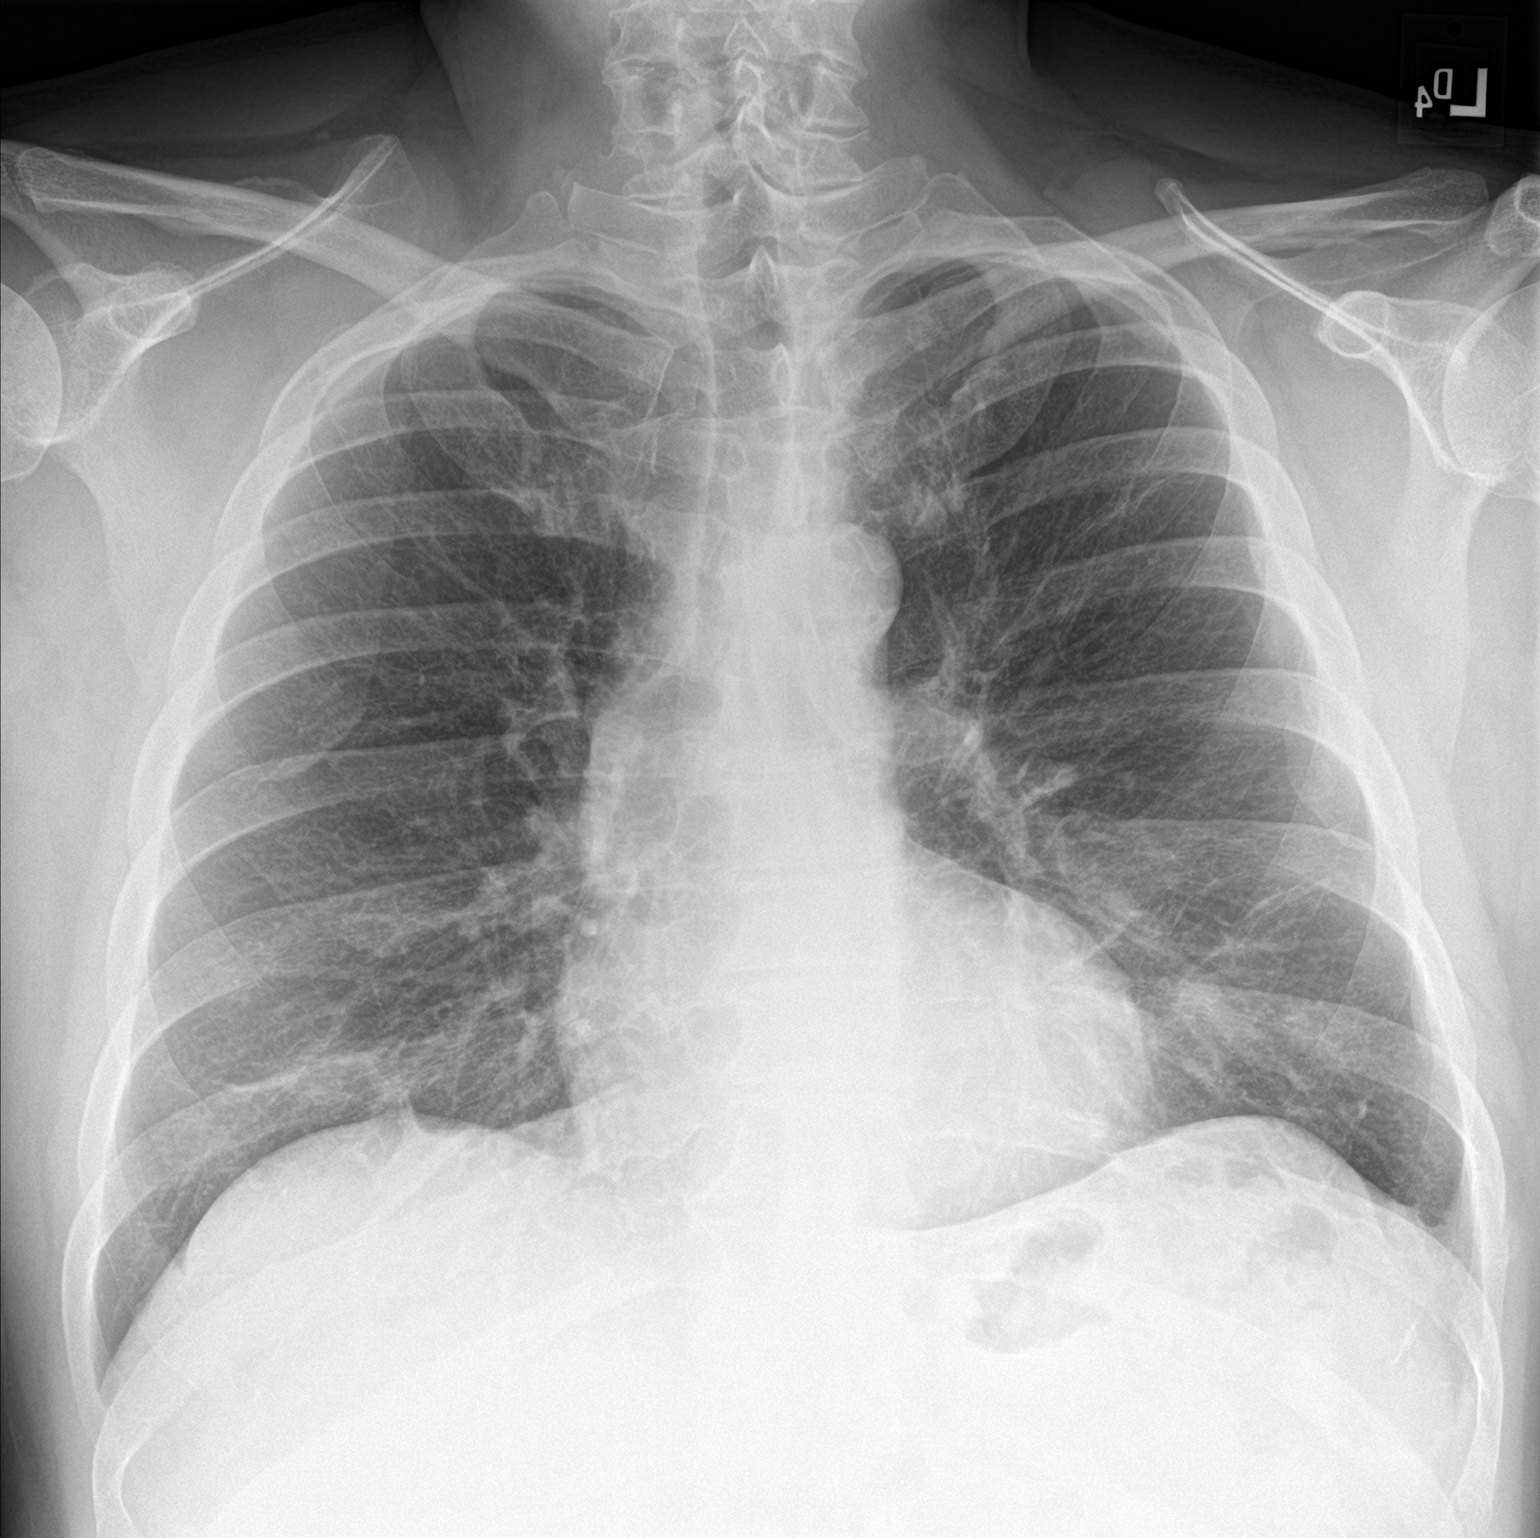

[chest lat]
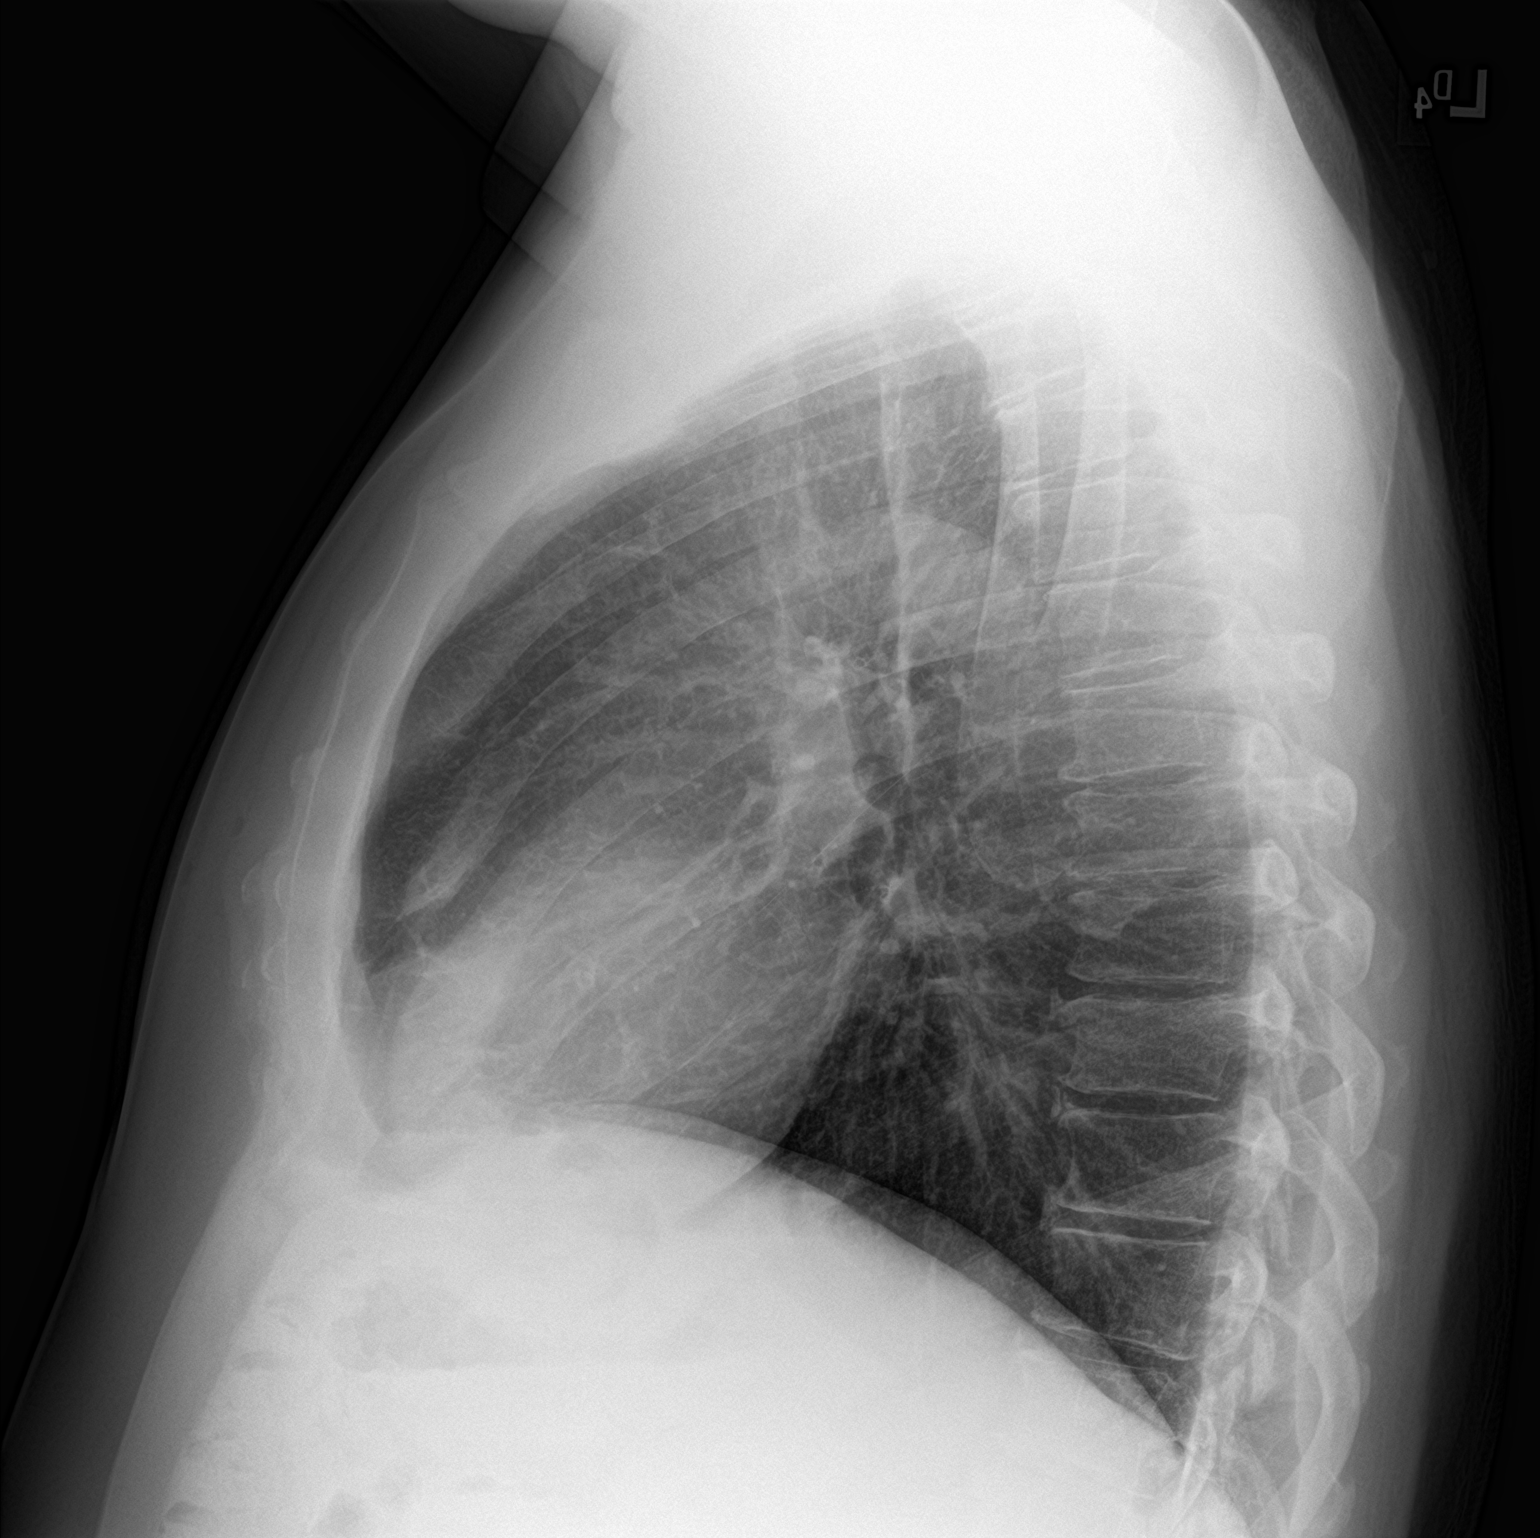

[2 of 2 positions shown; findings below may reference images not displayed]

FINDINGS: The heart size and mediastinal contours are within normal limits.
Right basilar linear opacity and streaky left basilar opacity,
likely atelectasis. No visible pneumothorax. No pleural effusion.
The visualized skeletal structures are unchanged.
IMPRESSION: 1. No acute cardiopulmonary process.
2. No visible pneumothorax.
3. Bibasilar atelectasis.

## 2021-10-14 IMAGING — CT CT ANGIO CHEST
2 of 7 series · 18 of 46 positions shown · IV contrast (omnipaque)
Comparison: Chest x-ray from the previous day, CT from 01/11/2020.

CLINICAL DATA: Chest pain and shortness of breath

EXAM:
CT ANGIOGRAPHY CHEST WITH CONTRAST
TECHNIQUE: Multidetector CT imaging of the chest was performed using the
standard protocol during bolus administration of intravenous
contrast. Multiplanar CT image reconstructions and MIPs were
obtained to evaluate the vascular anatomy.
CONTRAST:  65 mL OMNIPAQUE IOHEXOL 350 MG/ML SOLN

[Series 7: thins · axial · 0.98mm/px · z∈[-369,-63]mm · 15 of 493 slices shown]
[im 28/493  lung]
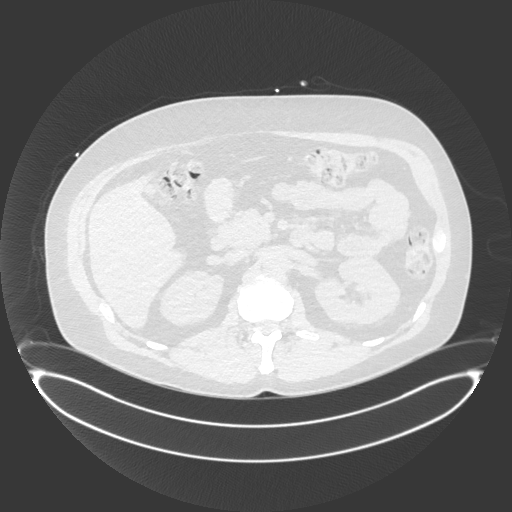
[im 55/493  soft-tissue]
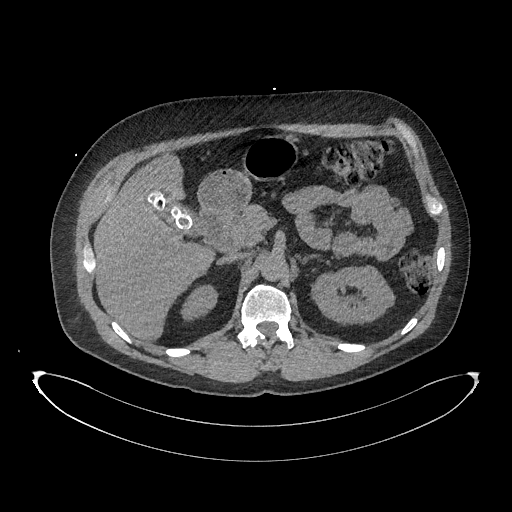
[im 83/493  lung]
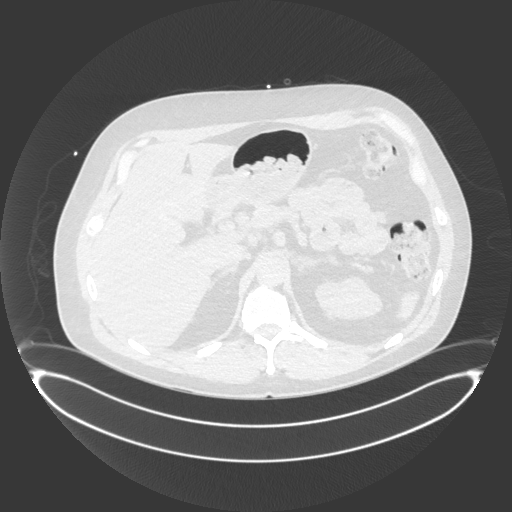
[im 110/493  soft-tissue]
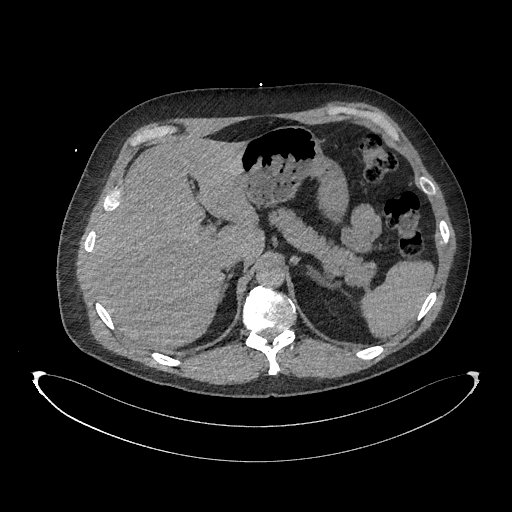
[im 165/493  lung]
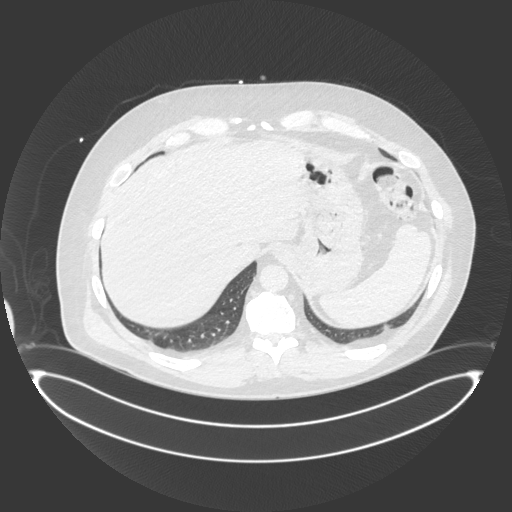
[im 192/493  soft-tissue]
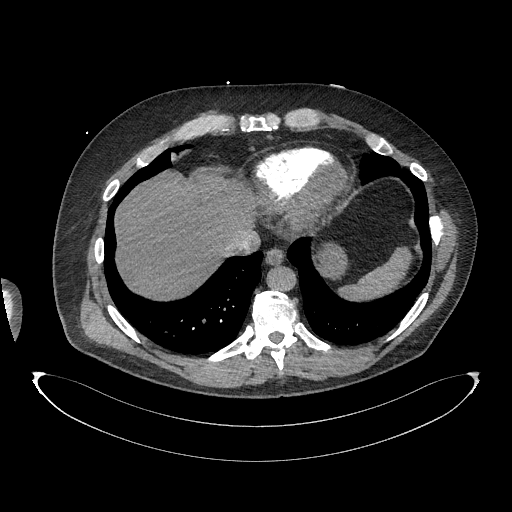
[im 219/493  lung]
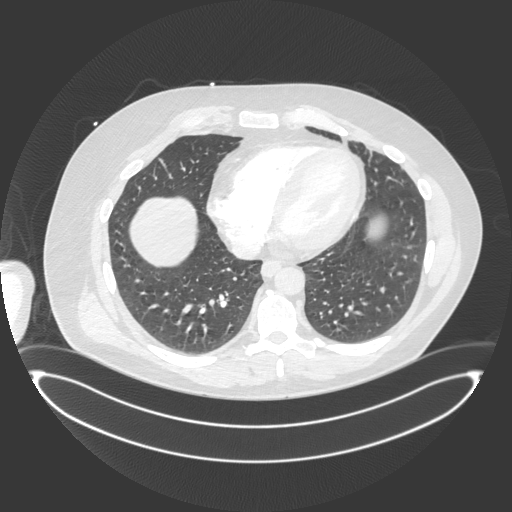
[im 247/493  soft-tissue]
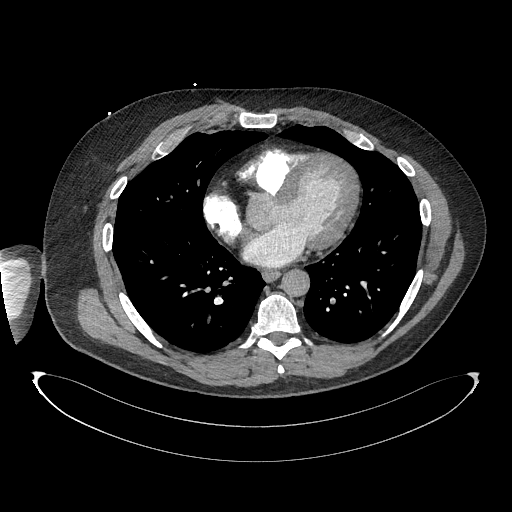
[im 274/493  lung]
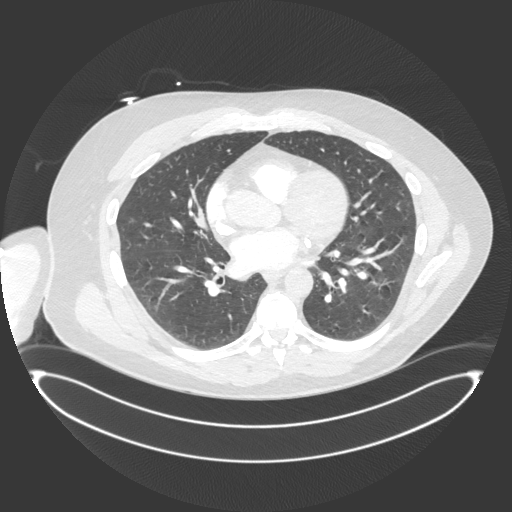
[im 301/493  soft-tissue]
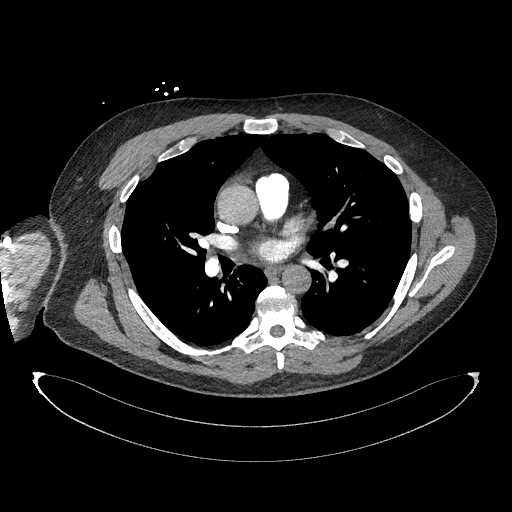
[im 329/493  lung]
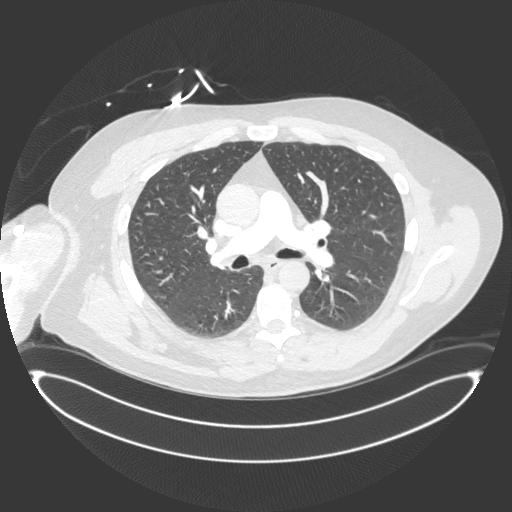
[im 383/493  soft-tissue]
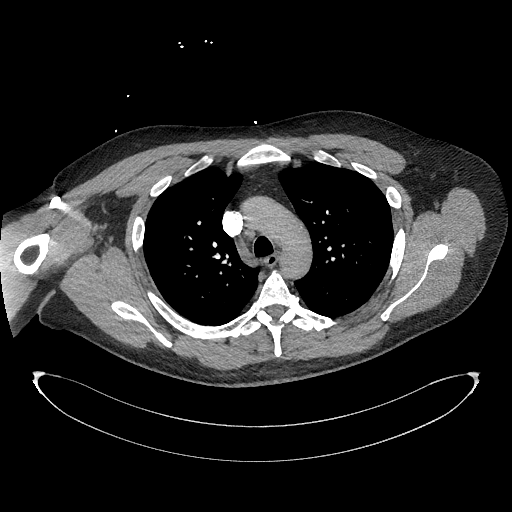
[im 411/493  lung]
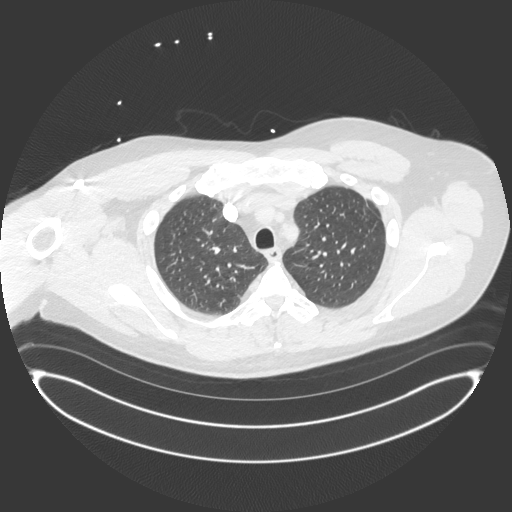
[im 438/493  soft-tissue]
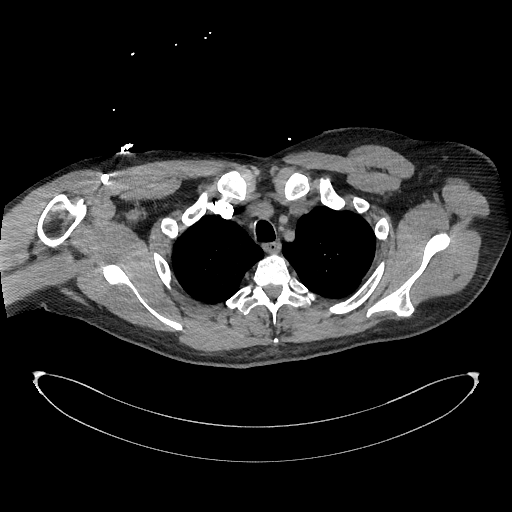
[im 465/493  lung]
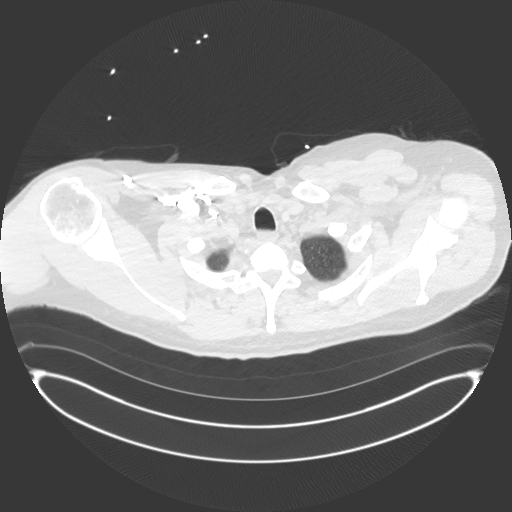

[Series 8: cor · coronal · 0.68mm/px · 3 of 164 slices shown]
[im 41/164  soft-tissue]
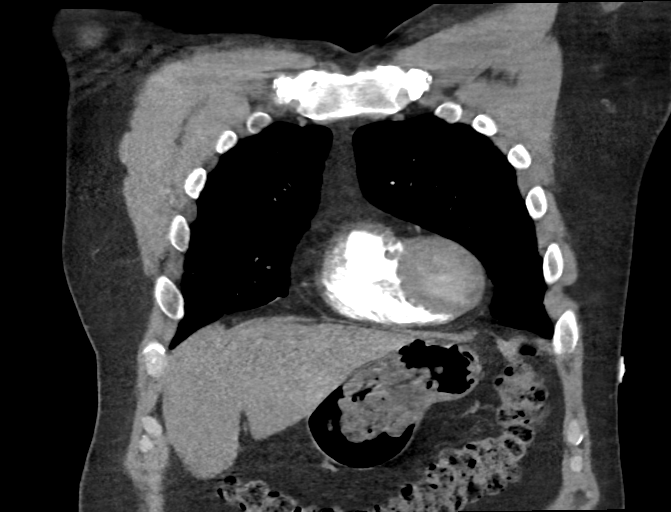
[im 82/164  soft-tissue]
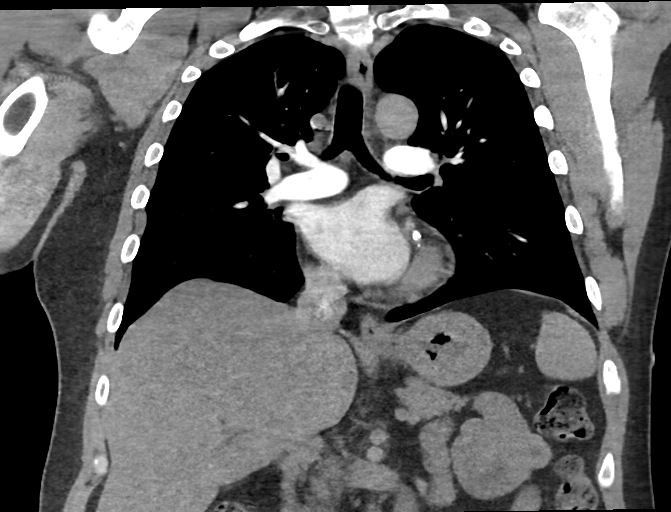
[im 123/164  soft-tissue]
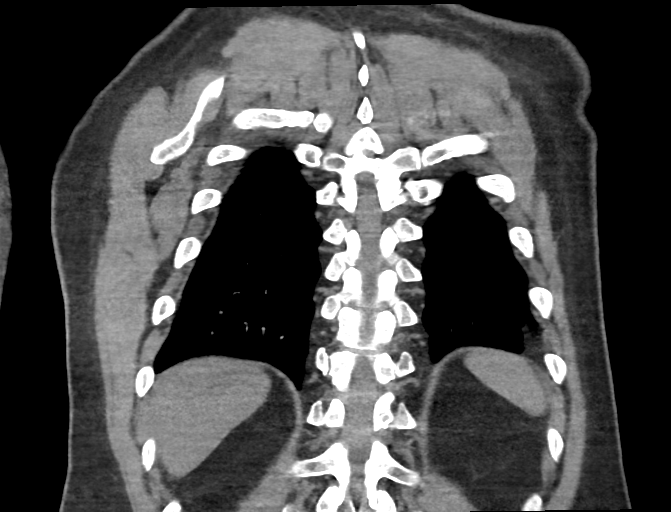

[18 of 46 positions shown; findings below may reference images not displayed]

FINDINGS: Cardiovascular: Thoracic aorta demonstrates atherosclerotic
calcifications with aneurysmal dilatation to 4.4 cm. Opacification
is limited in the aorta to evaluate for pneumothorax. The pulmonary
artery shows a normal branching pattern. No definitive filling
defects are noted to suggest pulmonary embolism. Coronary
calcifications are noted.

Mediastinum/Nodes: Thoracic inlet is within normal limits. No hilar
or mediastinal adenopathy is noted. The esophagus is within normal
limits.

Lungs/Pleura: Lungs are well aerated bilaterally. Mild scarring is
noted in the left upper lobe stable from the prior CT.

Upper Abdomen: Visualized upper abdomen demonstrates evidence of
cholelithiasis. No complicating factors are seen.

Musculoskeletal: Healed rib fractures are noted on the left similar
to that seen on prior CT examination. No acute bony abnormality is
noted.

Review of the MIP images confirms the above findings.
IMPRESSION: No evidence of pulmonary emboli.

Dilatation of the ascending aorta to 4.4 cm. Recommend annual
imaging followup by CTA or MRA. This recommendation follows 0444
ACCF/AHA/AATS/ACR/ASA/SCA/MANOEK/OVCAK/MARIA DOLORES/ZENTENO Guidelines for the
Diagnosis and Management of Patients with Thoracic Aortic Disease.
Circulation. 0444; 121: [id number]. Aortic aneurysm NOS (N52JV-PLS.I)

Cholelithiasis without complicating factors.

Aortic Atherosclerosis (N52JV-IFN.N).

## 2022-01-31 ENCOUNTER — Other Ambulatory Visit: Payer: Self-pay | Admitting: Family Medicine

## 2022-01-31 DIAGNOSIS — I10 Essential (primary) hypertension: Secondary | ICD-10-CM

## 2022-02-07 ENCOUNTER — Other Ambulatory Visit: Payer: Self-pay

## 2022-02-15 ENCOUNTER — Other Ambulatory Visit: Payer: Self-pay | Admitting: Nurse Practitioner

## 2022-02-15 DIAGNOSIS — I10 Essential (primary) hypertension: Secondary | ICD-10-CM

## 2022-02-15 DIAGNOSIS — I214 Non-ST elevation (NSTEMI) myocardial infarction: Secondary | ICD-10-CM

## 2022-02-15 DIAGNOSIS — I714 Abdominal aortic aneurysm, without rupture, unspecified: Secondary | ICD-10-CM

## 2022-02-15 NOTE — Telephone Encounter (Signed)
Medication Refill - Medication: enalapril (VASOTEC) 10 MG tablet, atorvastatin (LIPITOR) 80 MG tablet, and metoprolol succinate (TOPROL-XL) 50 MG 24 hr tablet   Has the patient contacted their pharmacy? Yes.   Pt told to contact provider to schedule appt. Pt has appt scheduled for 04/02/22  Preferred Pharmacy (with phone number or street name):  [facility] Phone: [phone]  Fax: [phone]     Has the patient been seen for an appointment in the last year OR does the patient have an upcoming appointment? Yes.    Agent: Please be advised that RX refills may take up to 3 business days. We ask that you follow-up with your pharmacy.

## 2022-02-16 NOTE — Telephone Encounter (Signed)
Requested medication (s) are due for refill today: Yes  Requested medication (s) are on the active medication list: Yes  Last refill:  03/02/21 Atorvastatin, Metoprolol; 08/08/21 Enalapril  Future visit scheduled: Yes  Notes to clinic:  Unable to refill per protocol due to failed labs, no updated results, need appointment.       Requested Prescriptions  Pending Prescriptions Disp Refills   enalapril (VASOTEC) 10 MG tablet 30 tablet 0    Sig: Take 1 tablet (10 mg total) by mouth daily.     Cardiovascular:  ACE Inhibitors Failed - 02/15/2022 10:01 AM      Failed - Cr in normal range and within 180 days    Creat  Date Value Ref Range Status  06/24/2014 0.95 0.50 - 1.35 mg/dL Final   Creatinine, Ser  Date Value Ref Range Status  03/07/2021 1.15 0.61 - 1.24 mg/dL Final         Failed - K in normal range and within 180 days    Potassium  Date Value Ref Range Status  03/07/2021 3.5 3.5 - 5.1 mmol/L Final         Failed - Last BP in normal range    BP Readings from Last 1 Encounters:  05/08/21 (!) 165/111         Failed - Valid encounter within last 6 months    Recent Outpatient Visits           12 months ago Essential hypertension   [place] [facility] [place], [place], [place]   1 year ago Abdominal aortic aneurysm (AAA) without rupture [facility])   [place] [facility] [place], Shea Stakes, NP   2 years ago Essential hypertension   [facility] [place], RPH-CPP   2 years ago Encounter to establish care   [place] [facility] [place], Shea Stakes, NP       Future Appointments             In 1 month Claiborne Rigg, NP [facility]            Passed - Patient is not pregnant       atorvastatin (LIPITOR) 80 MG tablet 30 tablet 2    Sig: TAKE 1 TABLET (80 MG TOTAL) BY MOUTH DAILY.     Cardiovascular:  Antilipid - Statins Failed -  02/15/2022 10:01 AM      Failed - Lipid Panel in normal range within the last 12 months    Cholesterol, Total  Date Value Ref Range Status  04/26/2020 141 100 - 199 mg/dL Final   LDL Chol Calc (NIH)  Date Value Ref Range Status  04/26/2020 88 0 - 99 mg/dL Final   HDL  Date Value Ref Range Status  04/26/2020 31 (L) >39 mg/dL Final   Triglycerides  Date Value Ref Range Status  04/26/2020 123 0 - 149 mg/dL Final         Passed - Patient is not pregnant      Passed - Valid encounter within last 12 months    Recent Outpatient Visits           12 months ago Essential hypertension   [place] [facility] [place], [place], [place]   1 year ago Abdominal aortic aneurysm (AAA) without rupture [facility])   [facility] [place], Shea Stakes, NP   2 years  ago Essential hypertension   [place], RPH-CPP   2 years ago Encounter to establish care   [place], NP       Future Appointments             In 1 month [name], NP [place]             metoprolol succinate (TOPROL-XL) 50 MG 24 hr tablet 135 tablet 3    Sig: Take 1.5 tablets (75 mg total) by mouth daily. Take with or immediately following a meal.     Cardiovascular:  Beta Blockers Failed - 02/15/2022 10:01 AM      Failed - Last BP in normal range    BP Readings from Last 1 Encounters:  05/08/21 (!) 165/111         Failed - Valid encounter within last 6 months    Recent Outpatient Visits           12 months ago Essential hypertension   [place], [place]   1 year ago Abdominal aortic aneurysm (AAA) without rupture [facility])   [place], Vernia Buff, NP   2 years ago Essential hypertension   [place], RPH-CPP   2 years ago Encounter to establish care   [place], NP       Future Appointments             In 1 month [name], NP [place] in normal range    Pulse Readings from Last 1 Encounters:  05/08/21 64

## 2022-02-19 ENCOUNTER — Other Ambulatory Visit: Payer: Self-pay

## 2022-02-19 MED ORDER — METOPROLOL SUCCINATE ER 50 MG PO TB24
75.0000 mg | ORAL_TABLET | Freq: Every day | ORAL | 0 refills | Status: DC
  Filled 2022-02-19: qty 45, 30d supply, fill #0

## 2022-02-19 MED ORDER — ENALAPRIL MALEATE 10 MG PO TABS
10.0000 mg | ORAL_TABLET | Freq: Every day | ORAL | 0 refills | Status: DC
  Filled 2022-02-19: qty 30, 30d supply, fill #0

## 2022-02-19 MED ORDER — ATORVASTATIN CALCIUM 80 MG PO TABS
80.0000 mg | ORAL_TABLET | Freq: Every day | ORAL | 0 refills | Status: DC
  Filled 2022-02-19 – 2022-02-22 (×2): qty 30, 30d supply, fill #0

## 2022-02-20 ENCOUNTER — Other Ambulatory Visit: Payer: Self-pay

## 2022-02-21 ENCOUNTER — Other Ambulatory Visit: Payer: Self-pay

## 2022-02-22 ENCOUNTER — Other Ambulatory Visit: Payer: Self-pay

## 2022-03-01 ENCOUNTER — Other Ambulatory Visit: Payer: Self-pay

## 2022-03-19 ENCOUNTER — Other Ambulatory Visit: Payer: Self-pay | Admitting: Surgery

## 2022-03-19 DIAGNOSIS — I7121 Aneurysm of the ascending aorta, without rupture: Secondary | ICD-10-CM

## 2022-03-26 ENCOUNTER — Encounter: Payer: Self-pay | Admitting: Nurse Practitioner

## 2022-03-26 ENCOUNTER — Ambulatory Visit: Payer: Self-pay | Admitting: *Deleted

## 2022-03-26 ENCOUNTER — Telehealth (HOSPITAL_BASED_OUTPATIENT_CLINIC_OR_DEPARTMENT_OTHER): Payer: Self-pay | Admitting: Nurse Practitioner

## 2022-03-26 ENCOUNTER — Other Ambulatory Visit: Payer: Self-pay

## 2022-03-26 DIAGNOSIS — F419 Anxiety disorder, unspecified: Secondary | ICD-10-CM

## 2022-03-26 DIAGNOSIS — I10 Essential (primary) hypertension: Secondary | ICD-10-CM

## 2022-03-26 DIAGNOSIS — F32A Depression, unspecified: Secondary | ICD-10-CM

## 2022-03-26 MED ORDER — BUPROPION HCL ER (XL) 150 MG PO TB24
150.0000 mg | ORAL_TABLET | Freq: Every day | ORAL | 3 refills | Status: DC
Start: 2022-03-26 — End: 2022-05-15

## 2022-03-26 MED ORDER — ENALAPRIL MALEATE 10 MG PO TABS
10.0000 mg | ORAL_TABLET | Freq: Every day | ORAL | 0 refills | Status: DC
  Filled 2022-03-26: qty 30, 30d supply, fill #0
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
## 2022-03-26 NOTE — Telephone Encounter (Signed)
  Chief Complaint: increased anxiety Symptoms: anxiety, overwhelmed, tearful, not eating Frequency: symptoms worse today Pertinent Negatives: Patient denies suicidal thoughts Disposition: [] ED /[] Urgent Care (no appt availability in office) / [] Appointment(In office/virtual)/ []  [place] [facility]/ [] Home Care/ [] Refused Recommended Disposition /[] [place] [facility]/ []  Follow-up with PCP Additional Notes: Patient advised will have to check with office for appointment- message sent- but no response. Patient advised of emergent care locations- [facility]/ED- will send message to see if patient can be seen this week as he feels he needs medication for anxiety
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
## 2022-04-02 ENCOUNTER — Ambulatory Visit: Payer: Self-pay | Attending: Nurse Practitioner | Admitting: Nurse Practitioner

## 2022-04-02 ENCOUNTER — Other Ambulatory Visit: Payer: Self-pay

## 2022-04-02 ENCOUNTER — Encounter: Payer: Self-pay | Admitting: Nurse Practitioner

## 2022-04-02 VITALS — BP 126/76 | HR 60 | Ht 77.0 in | Wt 243.8 lb

## 2022-04-02 DIAGNOSIS — D72829 Elevated white blood cell count, unspecified: Secondary | ICD-10-CM

## 2022-04-02 DIAGNOSIS — I214 Non-ST elevation (NSTEMI) myocardial infarction: Secondary | ICD-10-CM

## 2022-04-02 DIAGNOSIS — E785 Hyperlipidemia, unspecified: Secondary | ICD-10-CM

## 2022-04-02 DIAGNOSIS — R7303 Prediabetes: Secondary | ICD-10-CM

## 2022-04-02 DIAGNOSIS — I10 Essential (primary) hypertension: Secondary | ICD-10-CM

## 2022-04-02 DIAGNOSIS — F419 Anxiety disorder, unspecified: Secondary | ICD-10-CM

## 2022-04-02 MED ORDER — ATORVASTATIN CALCIUM 80 MG PO TABS
80.0000 mg | ORAL_TABLET | Freq: Every day | ORAL | 1 refills | Status: DC
  Filled 2022-04-02 – 2022-05-27 (×3): qty 30, 30d supply, fill #0
  Filled 2022-09-27: qty 30, 30d supply, fill #1

## 2022-04-02 MED ORDER — METOPROLOL SUCCINATE ER 50 MG PO TB24
75.0000 mg | ORAL_TABLET | Freq: Every day | ORAL | 1 refills | Status: DC
  Filled 2022-04-02 – 2022-05-15 (×2): qty 135, 90d supply, fill #0
  Filled 2022-05-27: qty 45, 30d supply, fill #0
  Filled 2022-09-27: qty 45, 30d supply, fill #1

## 2022-04-02 MED ORDER — ENALAPRIL MALEATE 10 MG PO TABS
10.0000 mg | ORAL_TABLET | Freq: Every day | ORAL | 1 refills | Status: DC
  Filled 2022-04-02 – 2022-05-27 (×3): qty 30, 30d supply, fill #0
  Filled 2022-09-27: qty 30, 30d supply, fill #1

## 2022-04-02 MED ORDER — HYDROXYZINE HCL 25 MG PO TABS
25.0000 mg | ORAL_TABLET | Freq: Three times a day (TID) | ORAL | 3 refills | Status: DC | PRN
  Filled 2022-04-02 – 2022-05-27 (×3): qty 60, 20d supply, fill #0
  Filled 2022-09-27: qty 60, 20d supply, fill #1

## 2022-04-02 MED ORDER — ASPIRIN 81 MG PO TBEC
81.0000 mg | DELAYED_RELEASE_TABLET | Freq: Every day | ORAL | 3 refills | Status: AC
Start: 2022-04-02 — End: ?

## 2022-04-02 NOTE — Progress Notes (Unsigned)
Assessment & Plan:  [name] was seen today for hypertension.  Diagnoses and all orders for this visit:  Essential hypertension -     enalapril (VASOTEC) 10 MG tablet; Take 1 tablet (10 mg total) by mouth daily. -     CMP14+EGFR Continue all antihypertensives as prescribed.  Reminded to bring in blood pressure log for follow  up appointment.  RECOMMENDATIONS: DASH/Mediterranean Diets are healthier choices for HTN.    Acute non-ST segment elevation myocardial infarction (HCC) -     metoprolol succinate (TOPROL-XL) 50 MG 24 hr tablet; Take 1.5 tablets (75 mg total) by mouth daily. Take with or immediately following a meal. -     atorvastatin (LIPITOR) 80 MG tablet; Take 1 tablet (80 mg total) by mouth daily. -     Ambulatory referral to Cardiology -     aspirin EC 81 MG tablet; Take 1 tablet (81 mg total) by mouth daily. Swallow whole.  Anxiety Continue Wellbutrin daily as prescribed -     hydrOXYzine (ATARAX) 25 MG tablet; Take 1 tablet (25 mg total) by mouth every 8 (eight) hours as needed.  Leukocytosis, unspecified type -     CBC with Differential  Prediabetes -     Hemoglobin A1c  Hyperlipidemia with target LDL less than 70 -     ezetimibe (ZETIA) 10 MG tablet; Take 1 tablet (10 mg total) by mouth daily. INSTRUCTIONS: Work on a low fat, heart healthy diet and participate in regular aerobic exercise program by working out at least 150 minutes per week; 5 days a week-30 minutes per day. Avoid red meat/beef/steak,  fried foods. junk foods, sodas, sugary drinks, unhealthy snacking, alcohol and smoking.  Drink at least 80 oz of water per day and monitor your carbohydrate intake daily.      Patient has been counseled on age-appropriate routine health concerns for screening and prevention. These are reviewed and up-to-date. Referrals have been placed accordingly. Immunizations are up-to-date or declined.    Subjective:   Chief Complaint  Patient presents with   Hypertension    HPI Isaac Goodwin 52 y.o. male presents to office today for follow up to HTN  Notes increased stressors in his life along with anxiety and depression. Recently started on wellbutrin and we will restart hydroxyzine today. Associated symptoms include insomnia. He does no endorse any current thought of self harm.    Blood pressure is well controlled.  He is currently taking enalapril 10 mg daily and Toprol-XL 75 mg daily.  He has not taken his Brilinta in quite some time.  Also overdue for follow-up with cardiology.  He is taking aspirin 81 mg daily There was a previous note for cardiology regarding him being on Plavix however will defer this to cardiology and have instructed him to schedule a visit as soon as possible. BP Readings from Last 3 Encounters:  04/02/22 126/76  05/08/21 (!) 165/111  03/08/21 (!) 132/98    Prediabetes Well controlled with diet only at this time.  Lab Results  Component Value Date   HGBA1C 5.8 (H) 04/02/2022    Review of Systems  Constitutional:  Negative for fever, malaise/fatigue and weight loss.  HENT: Negative.  Negative for nosebleeds.   Eyes: Negative.  Negative for blurred vision, double vision and photophobia.  Respiratory: Negative.  Negative for cough and shortness of breath.   Cardiovascular: Negative.  Negative for chest pain, palpitations and leg swelling.  Gastrointestinal: Negative.  Negative for heartburn, nausea and vomiting.  Musculoskeletal: Negative.  Negative for myalgias.  Neurological: Negative.  Negative for dizziness, focal weakness, seizures and headaches.  Psychiatric/Behavioral:  Positive for depression and memory loss. Negative for suicidal ideas. The patient is nervous/anxious and has insomnia.     Past Medical History:  Diagnosis Date   Coronary artery disease    Hypercholesteremia    Hypertension    Non-STEMI (non-ST elevated myocardial infarction) ([place]) 10/19/07   successful cutting balloon atherotomy/stenting cypher stent  3.0x25m just beyond ostium prox. in the RCA. 10/23/07 cypher stents placed in LAD & CX   OSA (obstructive sleep apnea)    Sinus headache    Sleep apnea     Past Surgical History:  Procedure Laterality Date   CORONARY STENT PLACEMENT  10/23/07   successful PTCA/stenting of 90% LAD cypher stent 3.0x142mtaper of 3.4-3.52m552mleft cx obtuse marginal 1 cypher stent 2.5x152m28m CORONARY STENT PLACEMENT  10/19/07   successful cutting balloon atherotomy/stenting cypher 3.0x152mm50mt beyond the ostium prox. in the RCA   CORONARY/GRAFT ACUTE MI REVASCULARIZATION N/A 02/28/2020   Procedure: Coronary/Graft Acute MI Revascularization;  Surgeon: VaranJettie Booze  [facility];  Service: Cardiovascular;  Laterality: N/A;   LEFT HEART CATH AND CORONARY ANGIOGRAPHY N/A 02/28/2020   Procedure: LEFT HEART CATH AND CORONARY ANGIOGRAPHY;  Surgeon: VaranJettie Booze  [facility];  Service: Cardiovascular;  Laterality: N/A;    Family History  Problem Relation Age of Onset   Diabetes Mother    Thyroid cancer Mother     Social History Reviewed with no changes to be made today.   Outpatient Medications Prior to Visit  Medication Sig Dispense Refill   acetaminophen (TYLENOL) 500 MG tablet Take 1,000 mg by mouth every 6 (six) hours as needed for mild pain.     aspirin EC 81 MG tablet Take 81 mg by mouth daily. Swallow whole.     Blood Pressure Monitoring (BLOOD PRESSURE KIT) DEVI Use to check blood pressure daily. 1 each 0   buPROPion (WELLBUTRIN XL) 150 MG 24 hr tablet Take 1 tablet (150 mg total) by mouth daily. Hydrochloride salt: Oral: Initial: 150 mg once daily in the morning; if needed, may increase in 2 weeks to 300 mg once daily 30 tablet 3   cholecalciferol (VITAMIN D3) 25 MCG (1000 UNIT) tablet Take 1,000 Units by mouth daily.     lidocaine (LIDODERM) 5 % Place 1 patch onto the skin daily as needed (pain).     Study - SOS-[facility]I - selatogrel 16 mg/0.5 mL or  placebo SQ injection (PI-[facility]) Inject 16 mg into the skin once. Inject 0.5 mL subcutaneously in the abdomen or thigh if you experience symptoms of a heart attack. Call 911 immediately  and seek emergency medical support.     atorvastatin (LIPITOR) 80 MG tablet Take 1 tablet (80 mg total) by mouth daily. 30 tablet 0   enalapril (VASOTEC) 10 MG tablet Take 1 tablet (10 mg total) by mouth daily. 30 tablet 0   hydrOXYzine (ATARAX) 25 MG tablet Take 1 tablet (25 mg total) by mouth every 6 (six) hours as needed. 12 tablet 0   metoprolol succinate (TOPROL-XL) 50 MG 24 hr tablet Take 1.5 tablets (75 mg total) by mouth daily. Take with or immediately following a meal. 45 tablet 0   nitroGLYCERIN (NITROSTAT) 0.4 MG SL tablet PLACE 1 TABLET (0.4 MG TOTAL) UNDER THE TONGUE EVERY FIVE MINUTES AS NEEDED. (Patient not taking: Reported on 04/02/2022) 25 tablet  2   ezetimibe (ZETIA) 10 MG tablet Take 1 tablet (10 mg total) by mouth daily. (Patient not taking: Reported on 04/02/2022) 90 tablet 0   ticagrelor (BRILINTA) 90 MG TABS tablet Take 1 tablet (90 mg total) by mouth 2 (two) times daily. (Patient not taking: Reported on 04/02/2022) 180 tablet 1   No facility-administered medications prior to visit.    Allergies  Allergen Reactions   Gabapentin Other (See Comments)    LOC changes- memory loss   Penicillins Anaphylaxis, Shortness Of Breath and Other (See Comments)    "Was told as a child that it caused breathing problems"        Objective:    BP 126/76   Pulse 60   Ht 6' 5"$  (1.956 m)   Wt 243 lb 12.8 oz (110.6 kg)   SpO2 98%   BMI 28.91 kg/m  Wt Readings from Last 3 Encounters:  04/02/22 243 lb 12.8 oz (110.6 kg)  08/07/21 260 lb (117.9 kg)  05/08/21 263 lb 3.2 oz (119.4 kg)    Physical Exam Vitals and nursing note reviewed.  Constitutional:      Appearance: He is well-developed.  HENT:     Head: Normocephalic and atraumatic.  Cardiovascular:     Rate and Rhythm: Normal rate and  regular rhythm.     Heart sounds: Normal heart sounds. No murmur heard.    No friction rub. No gallop.  Pulmonary:     Effort: Pulmonary effort is normal. No tachypnea or respiratory distress.     Breath sounds: Normal breath sounds. No decreased breath sounds, wheezing, rhonchi or rales.  Chest:     Chest wall: No tenderness.  Abdominal:     General: Bowel sounds are normal.     Palpations: Abdomen is soft.  Musculoskeletal:        General: Normal range of motion.     Cervical back: Normal range of motion.  Skin:    General: Skin is warm and dry.  Neurological:     Mental Status: He is alert and oriented to person, place, and time.     Coordination: Coordination normal.  Psychiatric:        Behavior: Behavior normal. Behavior is cooperative.        Thought Content: Thought content normal.        Judgment: Judgment normal.          Patient has been counseled extensively about nutrition and exercise as well as the importance of adherence with medications and regular follow-up. The patient was given clear instructions to go to ER or return to medical center if symptoms don't improve, worsen or new problems develop. The patient verbalized understanding.   Follow-up: Return in about 3 months (around 07/01/2022).   [name], FNP-BC [facility], [place]   04/03/2022, 1:21 PM

## 2022-04-03 ENCOUNTER — Other Ambulatory Visit: Payer: Self-pay

## 2022-04-03 ENCOUNTER — Encounter: Payer: Self-pay | Admitting: Nurse Practitioner

## 2022-04-03 LAB — CBC WITH DIFFERENTIAL/PLATELET
Basophils Absolute: 0 10*3/uL (ref 0.0–0.2)
Basos: 1 %
EOS (ABSOLUTE): 0.3 10*3/uL (ref 0.0–0.4)
Eos: 4 %
Hematocrit: 47.6 % (ref 37.5–51.0)
Hemoglobin: 16.5 g/dL (ref 13.0–17.7)
Immature Grans (Abs): 0 10*3/uL (ref 0.0–0.1)
Immature Granulocytes: 0 %
Lymphocytes Absolute: 2.1 10*3/uL (ref 0.7–3.1)
Lymphs: 29 %
MCH: 31.7 pg (ref 26.6–33.0)
MCHC: 34.7 g/dL (ref 31.5–35.7)
MCV: 91 fL (ref 79–97)
Monocytes Absolute: 0.5 10*3/uL (ref 0.1–0.9)
Monocytes: 7 %
Neutrophils Absolute: 4.2 10*3/uL (ref 1.4–7.0)
Neutrophils: 59 %
Platelets: 219 10*3/uL (ref 150–450)
RBC: 5.21 x10E6/uL (ref 4.14–5.80)
RDW: 12.5 % (ref 11.6–15.4)
WBC: 7.1 10*3/uL (ref 3.4–10.8)

## 2022-04-03 LAB — CMP14+EGFR
ALT: 19 IU/L (ref 0–44)
AST: 15 IU/L (ref 0–40)
Albumin/Globulin Ratio: 2.1 (ref 1.2–2.2)
Albumin: 4.5 g/dL (ref 3.8–4.9)
Alkaline Phosphatase: 58 IU/L (ref 44–121)
BUN/Creatinine Ratio: 13 (ref 9–20)
BUN: 11 mg/dL (ref 6–24)
Bilirubin Total: 0.4 mg/dL (ref 0.0–1.2)
CO2: 20 mmol/L (ref 20–29)
Calcium: 9.5 mg/dL (ref 8.7–10.2)
Chloride: 104 mmol/L (ref 96–106)
Creatinine, Ser: 0.85 mg/dL (ref 0.76–1.27)
Globulin, Total: 2.1 g/dL (ref 1.5–4.5)
Glucose: 91 mg/dL (ref 70–99)
Potassium: 4.2 mmol/L (ref 3.5–5.2)
Sodium: 139 mmol/L (ref 134–144)
Total Protein: 6.6 g/dL (ref 6.0–8.5)
eGFR: 104 mL/min/{1.73_m2} (ref >59)

## 2022-04-03 LAB — HEMOGLOBIN A1C
Est. average glucose Bld gHb Est-mCnc: 120 mg/dL
Hgb A1c MFr Bld: 5.8 % — ABNORMAL HIGH (ref 4.8–5.6)

## 2022-04-03 MED ORDER — EZETIMIBE 10 MG PO TABS
10.0000 mg | ORAL_TABLET | Freq: Every day | ORAL | 3 refills | Status: DC
  Filled 2022-04-03: qty 90, 90d supply, fill #0
  Filled 2022-05-15 – 2022-05-27 (×2): qty 30, 30d supply, fill #0
  Filled 2022-09-27: qty 30, 30d supply, fill #1

## 2022-04-08 ENCOUNTER — Other Ambulatory Visit: Payer: Self-pay

## 2022-04-09 ENCOUNTER — Other Ambulatory Visit: Payer: Self-pay

## 2022-04-10 ENCOUNTER — Other Ambulatory Visit: Payer: Self-pay
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
## 2022-05-08 ENCOUNTER — Ambulatory Visit: Payer: Self-pay | Attending: Student | Admitting: Student

## 2022-05-13 ENCOUNTER — Ambulatory Visit: Payer: Medicaid Other

## 2022-05-13 ENCOUNTER — Other Ambulatory Visit: Payer: Medicaid Other

## 2022-05-15 ENCOUNTER — Other Ambulatory Visit: Payer: Self-pay | Admitting: Nurse Practitioner

## 2022-05-15 ENCOUNTER — Other Ambulatory Visit: Payer: Self-pay

## 2022-05-15 DIAGNOSIS — F32A Depression, unspecified: Secondary | ICD-10-CM

## 2022-05-15 MED ORDER — BUPROPION HCL ER (XL) 150 MG PO TB24
150.0000 mg | ORAL_TABLET | Freq: Every day | ORAL | 3 refills | Status: DC
  Filled 2022-05-15 – 2022-05-27 (×2): qty 30, 30d supply, fill #0
  Filled 2022-09-27: qty 30, 30d supply, fill #1

## 2022-05-22 ENCOUNTER — Other Ambulatory Visit: Payer: Self-pay

## 2022-05-27 ENCOUNTER — Other Ambulatory Visit: Payer: Self-pay

## 2022-05-31 ENCOUNTER — Other Ambulatory Visit: Payer: Self-pay

## 2022-06-06 ENCOUNTER — Other Ambulatory Visit: Payer: Self-pay

## 2022-09-27 ENCOUNTER — Other Ambulatory Visit: Payer: Self-pay

## 2022-10-03 ENCOUNTER — Other Ambulatory Visit: Payer: Self-pay

## 2022-11-18 ENCOUNTER — Inpatient Hospital Stay (HOSPITAL_COMMUNITY): Payer: Self-pay

## 2022-11-18 ENCOUNTER — Ambulatory Visit (HOSPITAL_COMMUNITY): Admit: 2022-11-18 | Payer: Self-pay | Admitting: Internal Medicine

## 2022-11-18 ENCOUNTER — Encounter (HOSPITAL_COMMUNITY)
Admission: EM | Disposition: A | Discharge status: Home / Self Care | Payer: Self-pay | Source: Ambulatory Visit | Attending: Internal Medicine

## 2022-11-18 ENCOUNTER — Other Ambulatory Visit (HOSPITAL_COMMUNITY): Payer: Self-pay

## 2022-11-18 ENCOUNTER — Inpatient Hospital Stay (HOSPITAL_COMMUNITY)
Admission: EM | Admit: 2022-11-18 | Discharge: 2022-11-19 | DRG: 250 | Disposition: A | Discharge status: Home / Self Care | Payer: Self-pay | Source: Ambulatory Visit | Attending: Internal Medicine | Admitting: Internal Medicine

## 2022-11-18 DIAGNOSIS — Z635 Disruption of family by separation and divorce: Secondary | ICD-10-CM

## 2022-11-18 DIAGNOSIS — E785 Hyperlipidemia, unspecified: Secondary | ICD-10-CM | POA: Diagnosis present

## 2022-11-18 DIAGNOSIS — I213 ST elevation (STEMI) myocardial infarction of unspecified site: Secondary | ICD-10-CM

## 2022-11-18 DIAGNOSIS — Y712 Prosthetic and other implants, materials and accessory cardiovascular devices associated with adverse incidents: Secondary | ICD-10-CM | POA: Diagnosis present

## 2022-11-18 DIAGNOSIS — Z91148 Patient's other noncompliance with medication regimen for other reason: Secondary | ICD-10-CM

## 2022-11-18 DIAGNOSIS — I5022 Chronic systolic (congestive) heart failure: Secondary | ICD-10-CM

## 2022-11-18 DIAGNOSIS — Z888 Allergy status to other drugs, medicaments and biological substances status: Secondary | ICD-10-CM

## 2022-11-18 DIAGNOSIS — F17210 Nicotine dependence, cigarettes, uncomplicated: Secondary | ICD-10-CM | POA: Diagnosis present

## 2022-11-18 DIAGNOSIS — F17200 Nicotine dependence, unspecified, uncomplicated: Secondary | ICD-10-CM | POA: Diagnosis present

## 2022-11-18 DIAGNOSIS — I2121 ST elevation (STEMI) myocardial infarction involving left circumflex coronary artery: Principal | ICD-10-CM | POA: Diagnosis present

## 2022-11-18 DIAGNOSIS — R0789 Other chest pain: Secondary | ICD-10-CM

## 2022-11-18 DIAGNOSIS — Z720 Tobacco use: Secondary | ICD-10-CM

## 2022-11-18 DIAGNOSIS — Z880 Allergy status to penicillin: Secondary | ICD-10-CM

## 2022-11-18 DIAGNOSIS — I5020 Unspecified systolic (congestive) heart failure: Secondary | ICD-10-CM | POA: Insufficient documentation

## 2022-11-18 DIAGNOSIS — E7800 Pure hypercholesterolemia, unspecified: Secondary | ICD-10-CM | POA: Diagnosis present

## 2022-11-18 DIAGNOSIS — Z5902 Unsheltered homelessness: Secondary | ICD-10-CM

## 2022-11-18 DIAGNOSIS — I252 Old myocardial infarction: Secondary | ICD-10-CM

## 2022-11-18 DIAGNOSIS — Z955 Presence of coronary angioplasty implant and graft: Secondary | ICD-10-CM

## 2022-11-18 DIAGNOSIS — Z808 Family history of malignant neoplasm of other organs or systems: Secondary | ICD-10-CM

## 2022-11-18 DIAGNOSIS — I10 Essential (primary) hypertension: Secondary | ICD-10-CM | POA: Diagnosis present

## 2022-11-18 DIAGNOSIS — I2129 ST elevation (STEMI) myocardial infarction involving other sites: Secondary | ICD-10-CM | POA: Diagnosis present

## 2022-11-18 DIAGNOSIS — I2119 ST elevation (STEMI) myocardial infarction involving other coronary artery of inferior wall: Secondary | ICD-10-CM | POA: Diagnosis present

## 2022-11-18 DIAGNOSIS — Z79899 Other long term (current) drug therapy: Secondary | ICD-10-CM

## 2022-11-18 DIAGNOSIS — G4733 Obstructive sleep apnea (adult) (pediatric): Secondary | ICD-10-CM | POA: Diagnosis present

## 2022-11-18 DIAGNOSIS — Z9861 Coronary angioplasty status: Principal | ICD-10-CM

## 2022-11-18 DIAGNOSIS — I255 Ischemic cardiomyopathy: Secondary | ICD-10-CM | POA: Diagnosis present

## 2022-11-18 DIAGNOSIS — Z833 Family history of diabetes mellitus: Secondary | ICD-10-CM

## 2022-11-18 DIAGNOSIS — Y832 Surgical operation with anastomosis, bypass or graft as the cause of abnormal reaction of the patient, or of later complication, without mention of misadventure at the time of the procedure: Secondary | ICD-10-CM | POA: Diagnosis present

## 2022-11-18 DIAGNOSIS — Z7984 Long term (current) use of oral hypoglycemic drugs: Secondary | ICD-10-CM

## 2022-11-18 DIAGNOSIS — I5021 Acute systolic (congestive) heart failure: Secondary | ICD-10-CM | POA: Diagnosis present

## 2022-11-18 DIAGNOSIS — F419 Anxiety disorder, unspecified: Secondary | ICD-10-CM | POA: Diagnosis present

## 2022-11-18 DIAGNOSIS — I2510 Atherosclerotic heart disease of native coronary artery without angina pectoris: Secondary | ICD-10-CM | POA: Diagnosis present

## 2022-11-18 DIAGNOSIS — I214 Non-ST elevation (NSTEMI) myocardial infarction: Secondary | ICD-10-CM

## 2022-11-18 DIAGNOSIS — Z7902 Long term (current) use of antithrombotics/antiplatelets: Secondary | ICD-10-CM

## 2022-11-18 DIAGNOSIS — Z7982 Long term (current) use of aspirin: Secondary | ICD-10-CM

## 2022-11-18 DIAGNOSIS — I110 Hypertensive heart disease with heart failure: Secondary | ICD-10-CM | POA: Diagnosis present

## 2022-11-18 DIAGNOSIS — T82855A Stenosis of coronary artery stent, initial encounter: Secondary | ICD-10-CM | POA: Diagnosis present

## 2022-11-18 HISTORY — PX: CORONARY ULTRASOUND/IVUS: CATH118244

## 2022-11-18 HISTORY — PX: LEFT HEART CATH AND CORONARY ANGIOGRAPHY: CATH118249

## 2022-11-18 HISTORY — PX: CORONARY/GRAFT ACUTE MI REVASCULARIZATION: CATH118305

## 2022-11-18 LAB — COMPREHENSIVE METABOLIC PANEL
ALT: 22 U/L (ref 0–44)
AST: 20 U/L (ref 15–41)
Albumin: 3.7 g/dL (ref 3.5–5.0)
Alkaline Phosphatase: 56 U/L (ref 38–126)
Anion gap: 11 (ref 5–15)
BUN: 14 mg/dL (ref 6–20)
CO2: 20 mmol/L — ABNORMAL LOW (ref 22–32)
Calcium: 8.7 mg/dL — ABNORMAL LOW (ref 8.9–10.3)
Chloride: 105 mmol/L (ref 98–111)
Creatinine, Ser: 1.21 mg/dL (ref 0.61–1.24)
GFR, Estimated: >60 mL/min (ref >60)
Glucose, Bld: 224 mg/dL — ABNORMAL HIGH (ref 70–99)
Potassium: 3 mmol/L — ABNORMAL LOW (ref 3.5–5.1)
Sodium: 136 mmol/L (ref 135–145)
Total Bilirubin: 0.4 mg/dL (ref 0.3–1.2)
Total Protein: 6.4 g/dL — ABNORMAL LOW (ref 6.5–8.1)

## 2022-11-18 LAB — CBC
HCT: 49.5 % (ref 39.0–52.0)
Hemoglobin: 17.2 g/dL — ABNORMAL HIGH (ref 13.0–17.0)
MCH: 31.3 pg (ref 26.0–34.0)
MCHC: 34.7 g/dL (ref 30.0–36.0)
MCV: 90.2 fL (ref 80.0–100.0)
Platelets: 213 10*3/uL (ref 150–400)
RBC: 5.49 MIL/uL (ref 4.22–5.81)
RDW: 13.3 % (ref 11.5–15.5)
WBC: 7 10*3/uL (ref 4.0–10.5)
nRBC: 0 % (ref 0.0–0.2)

## 2022-11-18 LAB — POCT I-STAT, CHEM 8
BUN: 15 mg/dL (ref 6–20)
Calcium, Ion: 1.22 mmol/L (ref 1.15–1.40)
Chloride: 104 mmol/L (ref 98–111)
Creatinine, Ser: 0.9 mg/dL (ref 0.61–1.24)
Glucose, Bld: 223 mg/dL — ABNORMAL HIGH (ref 70–99)
HCT: 49 % (ref 39.0–52.0)
Hemoglobin: 16.7 g/dL (ref 13.0–17.0)
Potassium: 3.2 mmol/L — ABNORMAL LOW (ref 3.5–5.1)
Sodium: 139 mmol/L (ref 135–145)
TCO2: 21 mmol/L — ABNORMAL LOW (ref 22–32)

## 2022-11-18 LAB — HEMOGLOBIN A1C
Hgb A1c MFr Bld: 5.9 % — ABNORMAL HIGH (ref 4.8–5.6)
Mean Plasma Glucose: 122.63 mg/dL

## 2022-11-18 LAB — POCT ACTIVATED CLOTTING TIME
Activated Clotting Time: 311 s
Activated Clotting Time: 299 s

## 2022-11-18 LAB — LIPID PANEL
Cholesterol: 201 mg/dL — ABNORMAL HIGH (ref 0–200)
HDL: 45 mg/dL (ref >40)
LDL Cholesterol: 126 mg/dL — ABNORMAL HIGH (ref 0–99)
Total CHOL/HDL Ratio: 4.5 {ratio}
Triglycerides: 152 mg/dL — ABNORMAL HIGH (ref <150)
VLDL: 30 mg/dL (ref 0–40)

## 2022-11-18 LAB — CG4 I-STAT (LACTIC ACID): Lactic Acid, Venous: 2.5 mmol/L (ref 0.5–1.9)

## 2022-11-18 LAB — ECHOCARDIOGRAM COMPLETE
Area-P 1/2: 3.85 cm2
Calc EF: 33 %
S' Lateral: 3.9 cm
Single Plane A2C EF: 29.6 %
Single Plane A4C EF: 37.7 %
Weight: 3474.45 [oz_av]

## 2022-11-18 LAB — TROPONIN I (HIGH SENSITIVITY)
Troponin I (High Sensitivity): 115 ng/L (ref <18)
Troponin I (High Sensitivity): 13611 ng/L (ref <18)

## 2022-11-18 LAB — LACTIC ACID, PLASMA: Lactic Acid, Venous: 1.3 mmol/L (ref 0.5–1.9)

## 2022-11-18 LAB — APTT: aPTT: 31 s (ref 24–36)

## 2022-11-18 LAB — PROTIME-INR
INR: 0.9 (ref 0.8–1.2)
Prothrombin Time: 12.6 s (ref 11.4–15.2)

## 2022-11-18 SURGERY — LEFT HEART CATH AND CORONARY ANGIOGRAPHY
Anesthesia: LOCAL

## 2022-11-18 MED ORDER — ENOXAPARIN SODIUM 40 MG/0.4ML IJ SOSY
40.0000 mg | PREFILLED_SYRINGE | INTRAMUSCULAR | Status: DC
  Administered 2022-11-19: 40 mg via SUBCUTANEOUS
  Filled 2022-11-18: qty 0.4

## 2022-11-18 MED ORDER — SODIUM CHLORIDE 0.9% FLUSH: 3.0000 mL | INTRAVENOUS | Status: DC | PRN

## 2022-11-18 MED ORDER — ENALAPRIL MALEATE 10 MG PO TABS
10.0000 mg | ORAL_TABLET | Freq: Every day | ORAL | Status: DC
  Administered 2022-11-18: 10 mg via ORAL
  Filled 2022-11-18: qty 1

## 2022-11-18 MED ORDER — MIDAZOLAM HCL 2 MG/2ML IJ SOLN
INTRAMUSCULAR | Status: AC
  Filled 2022-11-18: qty 2

## 2022-11-18 MED ORDER — ONDANSETRON HCL 4 MG/2ML IJ SOLN: 4.0000 mg | Freq: Four times a day (QID) | INTRAMUSCULAR | Status: DC | PRN

## 2022-11-18 MED ORDER — FENTANYL CITRATE (PF) 100 MCG/2ML IJ SOLN
INTRAMUSCULAR | Status: AC
  Filled 2022-11-18: qty 2

## 2022-11-18 MED ORDER — IOHEXOL 350 MG/ML SOLN
INTRAVENOUS | Status: DC | PRN
  Administered 2022-11-18: 130 mL

## 2022-11-18 MED ORDER — HEPARIN SODIUM (PORCINE) 1000 UNIT/ML IJ SOLN
INTRAMUSCULAR | Status: DC | PRN
  Administered 2022-11-18 (×2): 5000 [IU] via INTRAVENOUS

## 2022-11-18 MED ORDER — ENALAPRIL MALEATE 10 MG PO TABS
10.0000 mg | ORAL_TABLET | Freq: Two times a day (BID) | ORAL | Status: DC
  Administered 2022-11-18 – 2022-11-19 (×2): 10 mg via ORAL
  Filled 2022-11-18 (×3): qty 1

## 2022-11-18 MED ORDER — METOPROLOL SUCCINATE ER 25 MG PO TB24
25.0000 mg | ORAL_TABLET | Freq: Every day | ORAL | Status: DC
  Administered 2022-11-18 – 2022-11-19 (×2): 25 mg via ORAL
  Filled 2022-11-18 (×2): qty 1

## 2022-11-18 MED ORDER — VERAPAMIL HCL 2.5 MG/ML IV SOLN
INTRAVENOUS | Status: DC | PRN
  Administered 2022-11-18: 10 mL via INTRA_ARTERIAL

## 2022-11-18 MED ORDER — HEPARIN (PORCINE) IN NACL 1000-0.9 UT/500ML-% IV SOLN
INTRAVENOUS | Status: DC | PRN
  Administered 2022-11-18 (×2): 500 mL

## 2022-11-18 MED ORDER — TIROFIBAN HCL IN NACL 5-0.9 MG/100ML-% IV SOLN
INTRAVENOUS | Status: AC
  Filled 2022-11-18: qty 100

## 2022-11-18 MED ORDER — SODIUM CHLORIDE 0.9 % IV SOLN: 250.0000 mL | INTRAVENOUS | Status: DC | PRN

## 2022-11-18 MED ORDER — SODIUM CHLORIDE 0.9% FLUSH
3.0000 mL | Freq: Two times a day (BID) | INTRAVENOUS | Status: DC
  Administered 2022-11-18 (×2): 3 mL via INTRAVENOUS

## 2022-11-18 MED ORDER — PRASUGREL HCL 10 MG PO TABS
ORAL_TABLET | ORAL | Status: DC | PRN
  Administered 2022-11-18: 60 mg via ORAL

## 2022-11-18 MED ORDER — FUROSEMIDE 10 MG/ML IJ SOLN
20.0000 mg | Freq: Once | INTRAMUSCULAR | Status: AC
  Administered 2022-11-18: 20 mg via INTRAVENOUS
  Filled 2022-11-18: qty 2

## 2022-11-18 MED ORDER — POTASSIUM CHLORIDE CRYS ER 20 MEQ PO TBCR
40.0000 meq | EXTENDED_RELEASE_TABLET | ORAL | Status: AC
  Administered 2022-11-18 (×3): 40 meq via ORAL
  Filled 2022-11-18 (×3): qty 2

## 2022-11-18 MED ORDER — TIROFIBAN HCL IN NACL 5-0.9 MG/100ML-% IV SOLN
0.1500 ug/kg/min | INTRAVENOUS | Status: AC
  Filled 2022-11-18: qty 100

## 2022-11-18 MED ORDER — HYDRALAZINE HCL 20 MG/ML IJ SOLN: 10.0000 mg | INTRAMUSCULAR | Status: AC | PRN

## 2022-11-18 MED ORDER — ACETAMINOPHEN 325 MG PO TABS: 650.0000 mg | ORAL_TABLET | ORAL | Status: DC | PRN

## 2022-11-18 MED ORDER — TIROFIBAN HCL IN NACL 5-0.9 MG/100ML-% IV SOLN
INTRAVENOUS | Status: AC | PRN
  Administered 2022-11-18: .15 ug/kg/min via INTRAVENOUS

## 2022-11-18 MED ORDER — FENTANYL CITRATE (PF) 100 MCG/2ML IJ SOLN
INTRAMUSCULAR | Status: DC | PRN
  Administered 2022-11-18 (×2): 25 ug via INTRAVENOUS

## 2022-11-18 MED ORDER — PRASUGREL HCL 10 MG PO TABS
ORAL_TABLET | ORAL | Status: AC
  Filled 2022-11-18: qty 6

## 2022-11-18 MED ORDER — PERFLUTREN LIPID MICROSPHERE
1.0000 mL | INTRAVENOUS | Status: AC | PRN
  Administered 2022-11-18: 2 mL via INTRAVENOUS

## 2022-11-18 MED ORDER — ATORVASTATIN CALCIUM 80 MG PO TABS
80.0000 mg | ORAL_TABLET | Freq: Every day | ORAL | Status: DC
  Administered 2022-11-18 – 2022-11-19 (×2): 80 mg via ORAL
  Filled 2022-11-18 (×2): qty 1

## 2022-11-18 MED ORDER — SODIUM CHLORIDE 0.9 % IV SOLN
INTRAVENOUS | Status: AC | PRN
  Administered 2022-11-18: 10 mL/h via INTRAVENOUS

## 2022-11-18 MED ORDER — PRASUGREL HCL 10 MG PO TABS
10.0000 mg | ORAL_TABLET | Freq: Every day | ORAL | Status: DC
  Administered 2022-11-19: 10 mg via ORAL
  Filled 2022-11-18: qty 1

## 2022-11-18 MED ORDER — FUROSEMIDE 10 MG/ML IJ SOLN
INTRAMUSCULAR | Status: DC | PRN
  Administered 2022-11-18: 40 mg via INTRAVENOUS

## 2022-11-18 MED ORDER — ASPIRIN 81 MG PO CHEW
81.0000 mg | CHEWABLE_TABLET | Freq: Every day | ORAL | Status: DC
  Administered 2022-11-19: 81 mg via ORAL
  Filled 2022-11-18: qty 1

## 2022-11-18 MED ORDER — FUROSEMIDE 10 MG/ML IJ SOLN
INTRAMUSCULAR | Status: AC
  Filled 2022-11-18: qty 4

## 2022-11-18 MED ORDER — TIROFIBAN (AGGRASTAT) BOLUS VIA INFUSION
INTRAVENOUS | Status: DC | PRN
  Administered 2022-11-18: 2825 ug via INTRAVENOUS

## 2022-11-18 MED ORDER — LABETALOL HCL 5 MG/ML IV SOLN: 10.0000 mg | INTRAVENOUS | Status: AC | PRN

## 2022-11-18 MED ORDER — LIDOCAINE HCL (PF) 1 % IJ SOLN
INTRAMUSCULAR | Status: DC | PRN
  Administered 2022-11-18: 2 mL

## 2022-11-18 MED ORDER — MIDAZOLAM HCL 2 MG/2ML IJ SOLN
INTRAMUSCULAR | Status: DC | PRN
  Administered 2022-11-18 (×2): 1 mg via INTRAVENOUS

## 2022-11-18 MED ORDER — ALPRAZOLAM 0.5 MG PO TABS
0.5000 mg | ORAL_TABLET | Freq: Three times a day (TID) | ORAL | Status: AC | PRN
  Administered 2022-11-18: 0.5 mg via ORAL
  Filled 2022-11-18: qty 1

## 2022-11-18 SURGICAL SUPPLY — 22 items
BALL SAPPHIRE NC24 3.75X10 (BALLOONS) ×1
BALLN SAPPHIRE 2.5X12 (BALLOONS) ×1
BALLN SCOREFLEX 3.50X10 (BALLOONS) ×1
BALLOON SAPPHIRE 2.5X12 (BALLOONS) IMPLANT
BALLOON SAPPHIRE NC24 3.75X10 (BALLOONS) IMPLANT
BALLOON SCOREFLEX 3.50X10 (BALLOONS) IMPLANT
CATH 5FR JL3.5 JR4 ANG PIG MP (CATHETERS) IMPLANT
CATH LAUNCHER 6FR EBU3.5 (CATHETERS) IMPLANT
CATH OPTICROSS HD (CATHETERS) IMPLANT
DEVICE RAD COMP TR BAND LRG (VASCULAR PRODUCTS) IMPLANT
ELECT DEFIB PAD ADLT CADENCE (PAD) IMPLANT
GLIDESHEATH SLEND SS 6F .021 (SHEATH) IMPLANT
GUIDEWIRE INQWIRE 1.5J.035X260 (WIRE) IMPLANT
INQWIRE 1.5J .035X260CM (WIRE) ×1
KIT ENCORE 26 ADVANTAGE (KITS) IMPLANT
KIT SYRINGE INJ CVI SPIKEX1 (MISCELLANEOUS) IMPLANT
PACK CARDIAC CATHETERIZATION (CUSTOM PROCEDURE TRAY) ×1 IMPLANT
PROTECTION STATION PRESSURIZED (MISCELLANEOUS) ×1
SET ATX-X65L (MISCELLANEOUS) IMPLANT
SLED PULL BACK IVUS (MISCELLANEOUS) IMPLANT
STATION PROTECTION PRESSURIZED (MISCELLANEOUS) IMPLANT
WIRE RUNTHROUGH .014X180CM (WIRE) IMPLANT

## 2022-11-18 NOTE — H&P (Addendum)
Cardiology Admission History and Physical   Patient ID: Isaac Goodwin MRN: [id number]; DOB: 10/12/1970   Admission date: 11/18/2022  PCP:  Claiborne Rigg, NP   [facility] Providers Cardiologist:  Nicki Guadalajara, MD    Chief Complaint:  Chest pain  Patient Profile:   Isaac Goodwin is a 52 y.o. male with history of CAD s/p MI and multiple PCI's, hypertension, hyperlipidemia, tobacco abuse, and homelessness, who is being seen 11/18/2022 for the evaluation of chest pain and abnormal EKG.  History of Present Illness:   Isaac Goodwin was on his way to work when he suddenly developed burning in his chest reminiscent of his prior MI.  Pain was initially 10/10 in intensity though subsided to 7/10 in intensity after EMS arrived and administered sublingual nitroglycerin and morphine.  He had been feeling well though he reports that he has not been taking many of his medications for some time.  He tries to take metoprolol and enalapril when possible.  He is currently going through divorce with his wife and is homeless.  He denies shortness of breath, palpitations, lightheadedness, and nausea.   Past Medical History:  Diagnosis Date   Coronary artery disease    Hypercholesteremia    Hypertension    Non-STEMI (non-ST elevated myocardial infarction) (HCC) 10/19/07   successful cutting balloon atherotomy/stenting cypher stent 3.0x15mm just beyond ostium prox. in the RCA. 10/23/07 cypher stents placed in LAD & CX   OSA (obstructive sleep apnea)    Sinus headache    Sleep apnea     Past Surgical History:  Procedure Laterality Date   CORONARY STENT PLACEMENT  10/23/07   successful PTCA/stenting of 90% LAD cypher stent 3.0x43mm taper of 3.4-3.16mm, left cx obtuse marginal 1 cypher stent 2.5x38mm    CORONARY STENT PLACEMENT  10/19/07   successful cutting balloon atherotomy/stenting cypher 3.0x53mm just beyond the ostium prox. in the RCA   CORONARY/GRAFT ACUTE MI REVASCULARIZATION N/A 02/28/2020    Procedure: Coronary/Graft Acute MI Revascularization;  Surgeon: Corky Crafts, MD;  Location: [facility] INVASIVE CV LAB;  Service: Cardiovascular;  Laterality: N/A;   LEFT HEART CATH AND CORONARY ANGIOGRAPHY N/A 02/28/2020   Procedure: LEFT HEART CATH AND CORONARY ANGIOGRAPHY;  Surgeon: Corky Crafts, MD;  Location: [facility] INVASIVE CV LAB;  Service: Cardiovascular;  Laterality: N/A;     Medications Prior to Admission: Prior to Admission medications   Medication Sig Start Date Albi Rappaport Date Taking? Authorizing Provider  acetaminophen (TYLENOL) 500 MG tablet Take 1,000 mg by mouth every 6 (six) hours as needed for mild pain.    [provider]  aspirin EC 81 MG tablet Take 81 mg by mouth daily. Swallow whole.    [provider]  aspirin EC 81 MG tablet Take 1 tablet (81 mg total) by mouth daily. Swallow whole. 04/02/22   Claiborne Rigg, NP  atorvastatin (LIPITOR) 80 MG tablet Take 1 tablet (80 mg total) by mouth daily. 04/02/22   Claiborne Rigg, NP  Blood Pressure Monitoring (BLOOD PRESSURE KIT) DEVI Use to check blood pressure daily. 09/19/21   Hoy Register, MD  buPROPion (WELLBUTRIN XL) 150 MG 24 hr tablet Take 1 tablet (150 mg total) by mouth daily. 05/15/22   Hoy Register, MD  cholecalciferol (VITAMIN D3) 25 MCG (1000 UNIT) tablet Take 1,000 Units by mouth daily.    [provider]  enalapril (VASOTEC) 10 MG tablet Take 1 tablet (10 mg total) by mouth daily. 04/02/22   Claiborne Rigg, NP  ezetimibe (ZETIA) 10 MG tablet Take 1 tablet (10 mg total) by mouth daily. 04/03/22   Claiborne Rigg, NP  hydrOXYzine (ATARAX) 25 MG tablet Take 1 tablet (25 mg total) by mouth every 8 (eight) hours as needed. 04/02/22   Claiborne Rigg, NP  lidocaine (LIDODERM) 5 % Place 1 patch onto the skin daily as needed (pain). 01/25/20   [provider]  metoprolol succinate (TOPROL-XL) 50 MG 24 hr tablet Take 1.5 tablets (75 mg total) by mouth daily. Take with or immediately  following a meal. 04/02/22   Claiborne Rigg, NP  nitroGLYCERIN (NITROSTAT) 0.4 MG SL tablet PLACE 1 TABLET (0.4 MG TOTAL) UNDER THE TONGUE EVERY FIVE MINUTES AS NEEDED. Patient not taking: Reported on 04/02/2022 03/01/20 03/08/21  Arty Baumgartner, NP  Study - SOS-AMI - selatogrel 16 mg/0.5 mL or placebo SQ injection (PI-[facility] [place]) Inject 16 mg into the skin once. Inject 0.5 mL subcutaneously in the abdomen or thigh if you experience symptoms of a heart attack. Call 911 immediately  and seek emergency medical support. 03/14/20   Jodelle Red, MD     Allergies:    Allergies  Allergen Reactions   Gabapentin Other (See Comments)    LOC changes- memory loss   Penicillins Anaphylaxis, Shortness Of Breath and Other (See Comments)    "Was told as a child that it caused breathing problems"     Social History:   Social History   Socioeconomic History   Marital status: Married    Spouse name: Not on file   Number of children: Not on file   Years of education: Not on file   Highest education level: Not on file  Occupational History   Not on file  Tobacco Use   Smoking status: Every Day    Current packs/day: 0.50    Average packs/day: 0.5 packs/day for 10.0 years (5.0 ttl pk-yrs)    Types: Cigarettes   Smokeless tobacco: Never   Tobacco comments:    3-4 cigarettes/day  Vaping Use   Vaping status: Never Used  Substance and Sexual Activity   Alcohol use: Not Currently   Drug use: No   Sexual activity: Yes  Other Topics Concern   Not on file  Social History Narrative   Not on file   Social Determinants of Health   Financial Resource Strain: Not on file  Food Insecurity: Not on file  Transportation Needs: Not on file  Physical Activity: Not on file  Stress: Not on file  Social Connections: Not on file  Intimate Partner Violence: Not on file    Family History:   The patient's family history includes Diabetes in his mother; Thyroid cancer in his mother.    ROS:   Please see the history of present illness.  All other ROS reviewed and negative.     Physical Exam/Data:   Vitals:   11/18/22 0903 11/18/22 0908 11/18/22 0913 11/18/22 0918  BP: (!) 138/94 (!) 143/90 (!) 143/90   Pulse: 67 65 63 (!) 0  Resp: 11 13 13    SpO2: 98%       Intake/Output Summary (Last 24 hours) at 11/18/2022 0937 Last data filed at 11/18/2022 0853 Gross per 24 hour  Intake --  Output 850 ml  Net -850 ml      04/02/2022    3:26 PM 08/07/2021   10:48 PM 05/08/2021   10:59 AM  Last 3 Weights  Weight (lbs) 243 lb 12.8 oz 260 lb 263 lb 3.2 oz  Weight (kg) 110.587 kg 117.935 kg 119.387 kg     There is no height or weight on file to calculate BMI.  General:  Well nourished, well developed, in no acute distress. HEENT: normal Neck: no JVD Vascular: 2+ radial pulses bilaterally. Cardiac:  normal S1, S2; RRR; no murmurs, rubs, or gallops. Lungs:  clear to auscultation bilaterally, no wheezing, rhonchi or rales  Abd: soft, nontender, no hepatomegaly  Ext: no lower extremity edema Musculoskeletal:  No deformities, BUE and BLE strength normal and equal Skin: warm and dry  Neuro:  CNs 2-12 intact, no focal abnormalities noted Psych:  Normal affect    EKG:  The ECG that was done by EMS was personally reviewed and demonstrates normal sinus rhythm with inferolateral ST segment elevation.  Relevant CV Studies: See catheterization results below.  Laboratory Data:  High Sensitivity Troponin:  No results for input(s): "TROPONINIHS" in the last 720 hours.    ChemistryNo results for input(s): "NA", "K", "CL", "CO2", "GLUCOSE", "BUN", "CREATININE", "CALCIUM", "MG", "GFRNONAA", "GFRAA", "ANIONGAP" in the last 168 hours.  No results for input(s): "PROT", "ALBUMIN", "AST", "ALT", "ALKPHOS", "BILITOT" in the last 168 hours. Lipids  Recent Labs  Lab 11/18/22 0800  CHOL 201*  TRIG 152*  HDL 45  LDLCALC 126*  CHOLHDL 4.5   Hematology Recent Labs  Lab 11/18/22 0800  WBC 7.0   RBC 5.49  HGB 17.2*  HCT 49.5  MCV 90.2  MCH 31.3  MCHC 34.7  RDW 13.3  PLT 213   Thyroid No results for input(s): "TSH", "FREET4" in the last 168 hours. BNPNo results for input(s): "BNP", "PROBNP" in the last 168 hours.  DDimer No results for input(s): "DDIMER" in the last 168 hours.   Radiology/Studies:  CARDIAC CATHETERIZATION  Result Date: 11/18/2022 Conclusions: Severe three-vessel coronary artery disease, as detailed below.  Culprit lesion for the patient's STEMI is thrombotic subtotal occlusion of the mid LCx stent.  Since his last catheterization in 2022, there has been interval occlusion of the mid LAD, with the distal vessel supplied by left-left collaterals.  Chronic total occlusion of the RCA is unchanged. Patent proximal LAD stent with approximately 20% in-stent restenosis. Patent OM1 stent with up to 50% in-stent restenosis. Widely patent OM2 stent. Mildly reduced left ventricular systolic function (LVEF 45-50%) with basal and mid inferior hypokinesis/akinesis. Severely elevated left ventricular filling pressure (LVEDP 35-40 mmHg). Successful IVUS-guided PTCA to thrombotic in-stent lesion in the mid LCx, dilated up to 3.9 mm at high pressure.  Final angiogram demonstrates less than 10% residual stenosis with TIMI-3 flow.  Placement of a second layer of stent was not performed given concerns about patient's medication compliance. Recommendations: Continue tirofiban infusion for 6 hours. Dual antiplatelet therapy with aspirin and prasugrel for at least 12 months, ideally indefinite DAPT in the setting of multiple PCI's and very late stent thrombosis. Aggressive secondary prevention, including high intensity statin therapy and smoking cessation. Continue diuresis as blood pressure and renal function allows; patient received furosemide 40 mg IV x 1 during the procedure. Obtain echocardiogram. Yvonne Kendall, MD [facility]    Assessment and Plan:   Inferolateral STEMI: Patient  presents with sudden onset of chest pain reminiscent of his prior MI.  EKG showed significant inferolateral ST segment elevation.  He underwent successful PTCA to subtotal occlusion of mid LCx stent.  Second layer of stent was not placed given concerns about medication compliance.  He is currently chest pain-free. -Admit to to heart ICU. -Continue tirofiban for 6 hours. -  Dual antiplatelet therapy with aspirin and prasugrel for at least 12 months; recommend long-term DAPT given history of multiple MIs and PCI's. -Continue metoprolol succinate, though will reduce dose to 25 mg daily.  Favor medical therapy of chronic total occlusions of the mid LAD and proximal RCA.  Heart failure with mildly reduced ejection fraction: LVEF approximately 45% by left ventriculogram today.  Patient without overt heart failure symptoms though left ventricular filling pressure was severely elevated during catheterization.  Patient received furosemide 40 mg IV x 1 with vigorous urine output. -Follow-up echocardiogram. -Continue metoprolol succinate, albeit 25 mg daily. -Continue enalapril. -Obtain echocardiogram. -Continue gentle diuresis as renal function and blood pressure allow.  Hypertension: Blood pressure moderately elevated in the setting of STEMI. -Continue metoprolol albeit at 25 mg daily. -Continue dose of enalapril with titration as needed.  Could consider transitioning to ARB or Entresto in the future if not cost prohibitive and LVEF significantly reduced by echo.  Hyperlipidemia: LDL suboptimally controlled on lipid panel today. -Atorvastatin 80 mg daily. -LP(a) pending.  Anxiety: Mr. Saraceni reports episodic anxiety which has flared with today's acute MI. -Check urine drug screen. -Alprazolam 0.5 mg every 6 hours as needed anxiety (ordered for 24 hours).  Tobacco use: -Smoking cessation encouraged.  Homelessness: -Care management consult placed to assist with medication assistance and other  resources.  Code Status: Full Code - confirmed with patient  Severity of Illness: The appropriate patient status for this patient is INPATIENT. Inpatient status is judged to be reasonable and necessary in order to provide the required intensity of service to ensure the patient's safety. The patient's presenting symptoms, physical exam findings, and initial radiographic and laboratory data in the context of their chronic comorbidities is felt to place them at high risk for further clinical deterioration. Furthermore, it is not anticipated that the patient will be medically stable for discharge from the hospital within 2 midnights of admission.   * I certify that at the point of admission it is my clinical judgment that the patient will require inpatient hospital care spanning beyond 2 midnights from the point of admission due to high intensity of service, high risk for further deterioration and high frequency of surveillance required.*   For questions or updates, please contact [facility] Please consult [email or web address] for contact info under   Signed, Yvonne Kendall, MD  11/18/2022 9:37 AM
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
## 2022-11-18 NOTE — Progress Notes (Signed)
   11/18/22 0800  Spiritual Encounters  Type of Visit Initial  Conversation partners present during encounter Nurse  Referral source Code page  Reason for visit Code  OnCall Visit Yes   Chaplain responded to Code page. The pt was unavailable. There was no family member. Chaplain will be available, if needed.  M.Kubra Kawana Hegel, MA Chaplain Intern [phone]

## 2022-11-18 NOTE — Progress Notes (Signed)
*  PRELIMINARY RESULTS*gram 2D Echocardiogram has been performed.  Janalyn Harder 11/18/2022, 3:19 PM

## 2022-11-19 ENCOUNTER — Other Ambulatory Visit (HOSPITAL_COMMUNITY): Payer: Self-pay

## 2022-11-19 ENCOUNTER — Other Ambulatory Visit: Payer: Self-pay

## 2022-11-19 ENCOUNTER — Encounter (HOSPITAL_COMMUNITY): Payer: Self-pay | Admitting: Internal Medicine

## 2022-11-19 ENCOUNTER — Telehealth: Payer: Self-pay | Admitting: Cardiology

## 2022-11-19 DIAGNOSIS — I255 Ischemic cardiomyopathy: Secondary | ICD-10-CM | POA: Insufficient documentation

## 2022-11-19 DIAGNOSIS — I5020 Unspecified systolic (congestive) heart failure: Secondary | ICD-10-CM | POA: Insufficient documentation

## 2022-11-19 LAB — CBC
HCT: 53.8 % — ABNORMAL HIGH (ref 39.0–52.0)
Hemoglobin: 18.9 g/dL — ABNORMAL HIGH (ref 13.0–17.0)
MCH: 31.8 pg (ref 26.0–34.0)
MCHC: 35.1 g/dL (ref 30.0–36.0)
MCV: 90.4 fL (ref 80.0–100.0)
Platelets: 217 10*3/uL (ref 150–400)
RBC: 5.95 MIL/uL — ABNORMAL HIGH (ref 4.22–5.81)
RDW: 13.4 % (ref 11.5–15.5)
WBC: 9.4 10*3/uL (ref 4.0–10.5)
nRBC: 0 % (ref 0.0–0.2)

## 2022-11-19 LAB — BASIC METABOLIC PANEL
Anion gap: 10 (ref 5–15)
BUN: 14 mg/dL (ref 6–20)
CO2: 21 mmol/L — ABNORMAL LOW (ref 22–32)
Calcium: 9.1 mg/dL (ref 8.9–10.3)
Chloride: 105 mmol/L (ref 98–111)
Creatinine, Ser: 1.05 mg/dL (ref 0.61–1.24)
GFR, Estimated: >60 mL/min (ref >60)
Glucose, Bld: 112 mg/dL — ABNORMAL HIGH (ref 70–99)
Potassium: 3.9 mmol/L (ref 3.5–5.1)
Sodium: 136 mmol/L (ref 135–145)

## 2022-11-19 LAB — MRSA NEXT GEN BY PCR, NASAL: MRSA by PCR Next Gen: NOT DETECTED

## 2022-11-19 LAB — MAGNESIUM: Magnesium: 2.1 mg/dL (ref 1.7–2.4)

## 2022-11-19 MED ORDER — ATORVASTATIN CALCIUM 80 MG PO TABS
80.0000 mg | ORAL_TABLET | Freq: Every day | ORAL | 3 refills | Status: AC
Start: 2022-11-19 — End: ?
  Filled 2022-11-19 – 2022-12-24 (×2): qty 30, 30d supply, fill #0
  Filled 2023-04-29: qty 30, 30d supply, fill #1

## 2022-11-19 MED ORDER — PRASUGREL HCL 10 MG PO TABS
10.0000 mg | ORAL_TABLET | Freq: Every day | ORAL | 3 refills | Status: AC
  Filled 2022-11-19 – 2022-12-24 (×2): qty 30, 30d supply, fill #0
  Filled 2023-04-29: qty 30, 30d supply, fill #1

## 2022-11-19 MED ORDER — CHLORHEXIDINE GLUCONATE CLOTH 2 % EX PADS: 6.0000 | MEDICATED_PAD | Freq: Every day | CUTANEOUS | Status: DC

## 2022-11-19 MED ORDER — ORAL CARE MOUTH RINSE: 15.0000 mL | OROMUCOSAL | Status: DC | PRN

## 2022-11-19 MED ORDER — EMPAGLIFLOZIN 10 MG PO TABS
10.0000 mg | ORAL_TABLET | Freq: Every day | ORAL | 3 refills | Status: DC
Start: 2022-11-19 — End: 2023-05-09
  Filled 2022-11-19: qty 30, 30d supply, fill #0
  Filled 2022-12-27: qty 30, 30d supply, fill #1

## 2022-11-19 MED ORDER — ENALAPRIL MALEATE 10 MG PO TABS
10.0000 mg | ORAL_TABLET | Freq: Two times a day (BID) | ORAL | 3 refills | Status: AC
  Filled 2022-11-19 – 2022-12-24 (×2): qty 60, 30d supply, fill #0
  Filled 2023-04-29: qty 60, 30d supply, fill #1

## 2022-11-19 MED ORDER — ASPIRIN 81 MG PO CHEW
81.0000 mg | CHEWABLE_TABLET | Freq: Every day | ORAL | 3 refills | Status: AC
  Filled 2022-11-19: qty 90, 90d supply, fill #0
  Filled 2023-04-29: qty 90, 90d supply, fill #1

## 2022-11-19 MED ORDER — NITROGLYCERIN 0.4 MG SL SUBL
0.4000 mg | SUBLINGUAL_TABLET | SUBLINGUAL | 3 refills | Status: AC | PRN
  Filled 2022-11-19: qty 25, 28d supply, fill #0

## 2022-11-19 MED ORDER — EMPAGLIFLOZIN 10 MG PO TABS
10.0000 mg | ORAL_TABLET | Freq: Every day | ORAL | Status: DC
  Administered 2022-11-19: 10 mg via ORAL
  Filled 2022-11-19: qty 1

## 2022-11-19 MED ORDER — METOPROLOL SUCCINATE ER 25 MG PO TB24
25.0000 mg | ORAL_TABLET | Freq: Every day | ORAL | 3 refills | Status: AC
  Filled 2022-11-19 – 2022-12-24 (×2): qty 30, 30d supply, fill #0
  Filled 2023-04-29: qty 30, 30d supply, fill #1

## 2022-11-19 NOTE — Plan of Care (Signed)
Refused discharge instructions from Cardiac Rehab, discharge materials left with patient at bedside.   Marletta Lor RN 11/19/22   Problem: Education: Goal: Knowledge of General Education information will improve Description: Including pain rating scale, medication(s)/side effects and non-pharmacologic comfort measures Outcome: Adequate for Discharge   Problem: Health Behavior/Discharge Planning: Goal: Ability to manage health-related needs will improve Outcome: Adequate for Discharge   Problem: Clinical Measurements: Goal: Ability to maintain clinical measurements within normal limits will improve Outcome: Adequate for Discharge Goal: Will remain free from infection Outcome: Adequate for Discharge Goal: Diagnostic test results will improve Outcome: Adequate for Discharge Goal: Respiratory complications will improve Outcome: Adequate for Discharge Goal: Cardiovascular complication will be avoided Outcome: Adequate for Discharge   Problem: Activity: Goal: Risk for activity intolerance will decrease Outcome: Adequate for Discharge   Problem: Nutrition: Goal: Adequate nutrition will be maintained Outcome: Adequate for Discharge   Problem: Coping: Goal: Level of anxiety will decrease Outcome: Adequate for Discharge   Problem: Elimination: Goal: Will not experience complications related to bowel motility Outcome: Adequate for Discharge Goal: Will not experience complications related to urinary retention Outcome: Adequate for Discharge   Problem: Pain Managment: Goal: General experience of comfort will improve Outcome: Adequate for Discharge   Problem: Safety: Goal: Ability to remain free from injury will improve Outcome: Adequate for Discharge   Problem: Skin Integrity: Goal: Risk for impaired skin integrity will decrease Outcome: Adequate for Discharge   Problem: Education: Goal: Understanding of CV disease, CV risk reduction, and recovery process will  improve Outcome: Adequate for Discharge Goal: Individualized Educational Video(s) Outcome: Adequate for Discharge   Problem: Activity: Goal: Ability to return to baseline activity level will improve Outcome: Adequate for Discharge   Problem: Cardiovascular: Goal: Ability to achieve and maintain adequate cardiovascular perfusion will improve Outcome: Adequate for Discharge Goal: Vascular access site(s) Level 0-1 will be maintained Outcome: Adequate for Discharge   Problem: Health Behavior/Discharge Planning: Goal: Ability to safely manage health-related needs after discharge will improve Outcome: Adequate for Discharge

## 2022-11-19 NOTE — Telephone Encounter (Signed)
Patient currently admitted.  Will need call once discharged.
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
## 2022-11-19 NOTE — Progress Notes (Addendum)
Pt requested to be left alone to rest, I left MI book and materials on tray in room if he d/c before I am able to f/u.   Revisited at 1450, pt denies questions informed of CR program. Will refer to [facility].  Faustino Congress 11/19/2022 10:44 AM

## 2022-11-19 NOTE — Progress Notes (Signed)
Rounding Note    Patient Name: Isaac Goodwin Date of Encounter: 11/19/2022  [facility] Cardiologist: Nicki Guadalajara, MD   Subjective   Postop day 1 lateral STEMI treated with PCI of the subtotally occluded AV groove circumflex.  Patient is asymptomatic this morning.  Inpatient Medications    Scheduled Meds:  aspirin  81 mg Oral Daily   atorvastatin  80 mg Oral Daily   Chlorhexidine Gluconate Cloth  6 each Topical Daily   enalapril  10 mg Oral BID   enoxaparin (LOVENOX) injection  40 mg Subcutaneous Q24H   metoprolol succinate  25 mg Oral Daily   prasugrel  10 mg Oral Daily   sodium chloride flush  3 mL Intravenous Q12H   Continuous Infusions:  sodium chloride Stopped (11/18/22 1307)   PRN Meds: sodium chloride, acetaminophen, ALPRAZolam, ondansetron (ZOFRAN) IV, mouth rinse, sodium chloride flush   Vital Signs    Vitals:   11/19/22 0400 11/19/22 0500 11/19/22 0600 11/19/22 0700  BP: 124/89 117/89 (!) 130/97 (!) 128/94  Pulse: 60 66 69 68  Resp: 17 16 17 13   Temp:      TempSrc:      SpO2: 94% 94% 96% 96%  Weight:        Intake/Output Summary (Last 24 hours) at 11/19/2022 0810 Last data filed at 11/19/2022 0600 Gross per 24 hour  Intake 242.43 ml  Output 3300 ml  Net -3057.57 ml      11/18/2022   10:00 AM 04/02/2022    3:26 PM 08/07/2021   10:48 PM  Last 3 Weights  Weight (lbs) 217 lb 2.5 oz 243 lb 12.8 oz 260 lb  Weight (kg) 98.5 kg 110.587 kg 117.935 kg      Telemetry    Sinus rhythm- Personally Reviewed  ECG    Normal sinus rhythm at 71 inferior and anterolateral Q waves as well as T wave inversion.- Personally Reviewed  Physical Exam   GEN: No acute distress.   Neck: No JVD Cardiac: RRR, no murmurs, rubs, or gallops.  Respiratory: Clear to auscultation bilaterally. GI: Soft, nontender, non-distended  MS: No edema; No deformity. Neuro:  Nonfocal  Psych: Normal affect   Labs    High Sensitivity Troponin:   Recent Labs  Lab  11/18/22 0800 11/18/22 1951  TROPONINIHS 115* 13,611*     Chemistry Recent Labs  Lab 11/18/22 0759 11/18/22 0800 11/19/22 0323  NA 139 136 136  K 3.2* 3.0* 3.9  CL 104 105 105  CO2  --  20* 21*  GLUCOSE 223* 224* 112*  BUN 15 14 14   CREATININE 0.90 1.21 1.05  CALCIUM  --  8.7* 9.1  MG  --   --  2.1  PROT  --  6.4*  --   ALBUMIN  --  3.7  --   AST  --  20  --   ALT  --  22  --   ALKPHOS  --  56  --   BILITOT  --  0.4  --   GFRNONAA  --  >60 >60  ANIONGAP  --  11 10    Lipids  Recent Labs  Lab 11/18/22 0800  CHOL 201*  TRIG 152*  HDL 45  LDLCALC 126*  CHOLHDL 4.5    Hematology Recent Labs  Lab 11/18/22 0759 11/18/22 0800 11/19/22 0323  WBC  --  7.0 9.4  RBC  --  5.49 5.95*  HGB 16.7 17.2* 18.9*  HCT 49.0 49.5 53.8*  MCV  --  90.2 90.4  MCH  --  31.3 31.8  MCHC  --  34.7 35.1  RDW  --  13.3 13.4  PLT  --  213 217   Thyroid No results for input(s): "TSH", "FREET4" in the last 168 hours.  BNPNo results for input(s): "BNP", "PROBNP" in the last 168 hours.  DDimer No results for input(s): "DDIMER" in the last 168 hours.   Radiology    ECHOCARDIOGRAM COMPLETE  Result Date: 11/18/2022    ECHOCARDIOGRAM REPORT   Patient Name:   Isaac Goodwin Date of Exam: 11/18/2022 Medical Rec #:  [id number]    Height:       77.0 in Accession #:    [id number]   Weight:       217.2 lb Date of Birth:  Mar 29, 1970    BSA:          2.316 m Patient Age:    52 years     BP:           138/105 mmHg Patient Gender: M            HR:           55 bpm. Exam Location:  Inpatient Procedure: 2D Echo, Cardiac Doppler, Color Doppler and Intracardiac            Opacification Agent Indications:    122-I22.9 Subsequent ST elevation (STEM) and non-ST elevation                 (NSTEMI) myocardial infarction  History:        Patient has prior history of Echocardiogram examinations. CAD                 and Acute MI, Signs/Symptoms:Chest Pain; Risk                 Factors:Hypertension, Dyslipidemia and Sleep  Apnea.  Sonographer:    Sheralyn Boatman RDCS Referring Phys: [phone] CHRISTOPHER END IMPRESSIONS  1. Left ventricular ejection fraction, by estimation, is 30 to 35%. The left ventricle has moderately decreased function. The left ventricle demonstrates regional wall motion abnormalities with inferior hypokinesis, mid to apical inferolateral akinesis,  mid to apical anteroseptal akinesis, akinesis of the apical anterior wall and true apex. There is mild concentric left ventricular hypertrophy. Left ventricular diastolic parameters are consistent with Grade I diastolic dysfunction (impaired relaxation).  2. Right ventricular systolic function is normal. The right ventricular size is normal. Tricuspid regurgitation signal is inadequate for assessing PA pressure.  3. The mitral valve is normal in structure. Trivial mitral valve regurgitation. No evidence of mitral stenosis.  4. The aortic valve is tricuspid. There is mild calcification of the aortic valve. Aortic valve regurgitation is not visualized. No aortic stenosis is present.  5. The inferior vena cava is dilated in size with >50% respiratory variability, suggesting right atrial pressure of 8 mmHg. FINDINGS  Left Ventricle: Left ventricular ejection fraction, by estimation, is 30 to 35%. The left ventricle has moderately decreased function. The left ventricle demonstrates regional wall motion abnormalities. Definity contrast agent was given IV to delineate the left ventricular endocardial borders. The left ventricular internal cavity size was normal in size. There is mild concentric left ventricular hypertrophy. Left ventricular diastolic parameters are consistent with Grade I diastolic dysfunction (impaired relaxation). Right Ventricle: The right ventricular size is normal. No increase in right ventricular wall thickness. Right ventricular systolic function is normal. Tricuspid regurgitation signal is inadequate for assessing PA pressure. Left Atrium: Left atrial size was  normal in size. Right Atrium: Right atrial size was normal in size. Pericardium: There is no evidence of pericardial effusion. Mitral Valve: The mitral valve is normal in structure. Trivial mitral valve regurgitation. No evidence of mitral valve stenosis. Tricuspid Valve: The tricuspid valve is normal in structure. Tricuspid valve regurgitation is not demonstrated. Aortic Valve: The aortic valve is tricuspid. There is mild calcification of the aortic valve. Aortic valve regurgitation is not visualized. No aortic stenosis is present. Pulmonic Valve: The pulmonic valve was normal in structure. Pulmonic valve regurgitation is not visualized. Aorta: The aortic root is normal in size and structure. Venous: The inferior vena cava is dilated in size with greater than 50% respiratory variability, suggesting right atrial pressure of 8 mmHg. IAS/Shunts: No atrial level shunt detected by color flow Doppler.  LEFT VENTRICLE PLAX 2D LVIDd:         5.10 cm      Diastology LVIDs:         3.90 cm      LV e' medial:    5.11 cm/s LV PW:         1.30 cm      LV E/e' medial:  20.9 LV IVS:        1.47 cm      LV e' lateral:   5.87 cm/s LVOT diam:     2.40 cm      LV E/e' lateral: 18.2 LV SV:         79 LV SV Index:   34 LVOT Area:     4.52 cm  LV Volumes (MOD) LV vol d, MOD A2C: 71.6 ml LV vol d, MOD A4C: 146.0 ml LV vol s, MOD A2C: 50.4 ml LV vol s, MOD A4C: 91.0 ml LV SV MOD A2C:     21.2 ml LV SV MOD A4C:     146.0 ml LV SV MOD BP:      33.4 ml RIGHT VENTRICLE            IVC RV S prime:     9.79 cm/s  IVC diam: 2.20 cm TAPSE (M-mode): 2.4 cm LEFT ATRIUM             Index        RIGHT ATRIUM           Index LA diam:        4.20 cm 1.81 cm/m   RA Area:     13.70 cm LA Vol (A2C):   29.5 ml 12.74 ml/m  RA Volume:   28.40 ml  12.26 ml/m LA Vol (A4C):   42.7 ml 18.43 ml/m LA Biplane Vol: 35.4 ml 15.28 ml/m  AORTIC VALVE LVOT Vmax:   85.90 cm/s LVOT Vmean:  54.700 cm/s LVOT VTI:    0.174 m  AORTA Ao Root diam: 3.60 cm Ao Asc diam:   3.60 cm MITRAL VALVE MV Area (PHT): 3.85 cm     SHUNTS MV Decel Time: 197 msec     Systemic VTI:  0.17 m MV E velocity: 107.00 cm/s  Systemic Diam: 2.40 cm MV A velocity: 123.00 cm/s MV E/A ratio:  0.87 Dalton McleanMD Electronically signed by Wilfred Lacy Signature Date/Time: 11/18/2022/4:15:28 PM    Final    CARDIAC CATHETERIZATION  Result Date: 11/18/2022 Conclusions: Severe three-vessel coronary artery disease, as detailed below.  Culprit lesion for the patient's STEMI is thrombotic subtotal occlusion of the mid LCx stent.  Since his last catheterization in 2022, there has been interval occlusion  of the mid LAD, with the distal vessel supplied by left-left collaterals.  Chronic total occlusion of the RCA is unchanged. Patent proximal LAD stent with approximately 20% in-stent restenosis. Patent OM1 stent with up to 50% in-stent restenosis. Widely patent OM2 stent. Mildly reduced left ventricular systolic function (LVEF 45-50%) with basal and mid inferior hypokinesis/akinesis. Severely elevated left ventricular filling pressure (LVEDP 35-40 mmHg). Successful IVUS-guided PTCA to thrombotic in-stent lesion in the mid LCx, dilated up to 3.9 mm at high pressure.  Final angiogram demonstrates less than 10% residual stenosis with TIMI-3 flow.  Placement of a second layer of stent was not performed given concerns about patient's medication compliance. Recommendations: Continue tirofiban infusion for 6 hours. Dual antiplatelet therapy with aspirin and prasugrel for at least 12 months, ideally indefinite DAPT in the setting of multiple PCI's and very late stent thrombosis. Aggressive secondary prevention, including high intensity statin therapy and smoking cessation. Continue diuresis as blood pressure and renal function allows; patient received furosemide 40 mg IV x 1 during the procedure. Obtain echocardiogram. Yvonne Kendall, MD [facility]   Cardiac Studies   2D echocardiogram (11/08/2022)  IMPRESSIONS      1. Left ventricular ejection fraction, by estimation, is 30 to 35%. The  left ventricle has moderately decreased function. The left ventricle  demonstrates regional wall motion abnormalities with inferior hypokinesis,  mid to apical inferolateral akinesis,   mid to apical anteroseptal akinesis, akinesis of the apical anterior wall  and true apex. There is mild concentric left ventricular hypertrophy. Left  ventricular diastolic parameters are consistent with Grade I diastolic  dysfunction (impaired  relaxation).   2. Right ventricular systolic function is normal. The right ventricular  size is normal. Tricuspid regurgitation signal is inadequate for assessing  PA pressure.   3. The mitral valve is normal in structure. Trivial mitral valve  regurgitation. No evidence of mitral stenosis.   4. The aortic valve is tricuspid. There is mild calcification of the  aortic valve. Aortic valve regurgitation is not visualized. No aortic  stenosis is present.   5. The inferior vena cava is dilated in size with >50% respiratory  variability, suggesting right atrial pressure of 8 mmHg.    Cardiac catheterization/PCI and stent (11/18/2022) Conclusion  Conclusions: Severe three-vessel coronary artery disease, as detailed below.  Culprit lesion for the patient's STEMI is thrombotic subtotal occlusion of the mid LCx stent.  Since his last catheterization in 2022, there has been interval occlusion of the mid LAD, with the distal vessel supplied by left-left collaterals.  Chronic total occlusion of the RCA is unchanged. Patent proximal LAD stent with approximately 20% in-stent restenosis. Patent OM1 stent with up to 50% in-stent restenosis. Widely patent OM2 stent. Mildly reduced left ventricular systolic function (LVEF 45-50%) with basal and mid inferior hypokinesis/akinesis. Severely elevated left ventricular filling pressure (LVEDP 35-40 mmHg). Successful IVUS-guided PTCA to thrombotic in-stent  lesion in the mid LCx, dilated up to 3.9 mm at high pressure.  Final angiogram demonstrates less than 10% residual stenosis with TIMI-3 flow.  Placement of a second layer of stent was not performed given concerns about patient's medication compliance.   Recommendations: Continue tirofiban infusion for 6 hours. Dual antiplatelet therapy with aspirin and prasugrel for at least 12 months, ideally indefinite DAPT in the setting of multiple PCI's and very late stent thrombosis. Aggressive secondary prevention, including high intensity statin therapy and smoking cessation. Continue diuresis as blood pressure and renal function allows; patient received furosemide 40 mg IV x  1 during the procedure. Obtain echocardiogram.   Yvonne Kendall, MD [facility]   iagrams  Diagnostic Dominance: Right  Intervention   Patient Profile     Shahir Karen is a 52 y.o. male with history of CAD s/p MI and multiple PCI's, hypertension, hyperlipidemia, tobacco abuse, and homelessness, who is being seen 11/18/2022 for the evaluation of chest pain and abnormal EKG.   Assessment & Plan    1: Inferolateral STEMI-Long history of intervention dating back to 2009.  He had complex circumflex and obtuse marginal branch intervention in 2022 by Dr. Eldridge Dace.  He presented with chest pain yesterday cardiac catheterization revealed a subtotally occluded AV groove circumflex.  Troponins went up to over 13,000.  He underwent IVUS guided PCI using score flex and balloon angioplasty.  Stent was not deployed because of 2 layers of previously Existing stent.  He had a good angiographic result.  He is on aspirin and prasugrel.  There has been a history of medication noncompliance because of social issues.  Pharmacy, case manager and social worker will fax.  2: LV dysfunction-ejection fraction has declined to 30 to 35% compared to 50 to 55% 2 years ago.  He has occluded LAD and RCA with left-to-right collaterals.  He is on  metoprolol and enalapril.  Will need to add spironolactone and SGLT2.  If this cannot be done today we will address in [facility] heart failure clinic.  3: Essential hypertension-blood pressure controlled with current medications  4: Hyperlipidemia-on high-dose statin therapy at home.  Lipid profile during his hospitalization revealed total cholesterol 213 LDL of 126.  5: Tobacco abuse-discussed the importance of dissociations  6: Homelessness-patient is very difficult to situation.  He is undergoing divorce.  He has medication payments for his daughter in college.  Currently living in a car.  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  Postop day 1 inferolateral STEMI moderately severe LV dysfunction.  His occluded LAD and RCA with left to left collaterals and left-to-right collaterals.  Ideally I would like to keep him in the hospital for at least 24 to 48 hours (social constraints he request discharge today.  Will facilitate his medications and early outpatient follow-up.  For questions or updates, please contact [facility] Please consult [email or web address] for contact info under        Signed, Nanetta Batty, MD  11/19/2022, 8:10 AM

## 2022-11-19 NOTE — Discharge Summary (Addendum)
Discharge Summary    Patient ID: Isaac Goodwin MRN: [id number]; DOB: 07-Jul-1970  Admit date: 11/18/2022 Discharge date: 11/19/2022  PCP:  Claiborne Rigg, NP   [facility] Providers Cardiologist:  Nicki Guadalajara, MD    Discharge Diagnoses    Principal Problem:   STEMI involving left circumflex coronary artery [facility]) Active Problems:   Tobacco dependence   Essential hypertension   Hyperlipidemia with target LDL less than 70   HFrEF (heart failure with reduced ejection fraction) (HCC)   Ischemic cardiomyopathy  Diagnostic Studies/Procedures    Cath: 11/18/2022  Conclusions: Severe three-vessel coronary artery disease, as detailed below.  Culprit lesion for the patient's STEMI is thrombotic subtotal occlusion of the mid LCx stent.  Since his last catheterization in 2022, there has been interval occlusion of the mid LAD, with the distal vessel supplied by left-left collaterals.  Chronic total occlusion of the RCA is unchanged. Patent proximal LAD stent with approximately 20% in-stent restenosis. Patent OM1 stent with up to 50% in-stent restenosis. Widely patent OM2 stent. Mildly reduced left ventricular systolic function (LVEF 45-50%) with basal and mid inferior hypokinesis/akinesis. Severely elevated left ventricular filling pressure (LVEDP 35-40 mmHg). Successful IVUS-guided PTCA to thrombotic in-stent lesion in the mid LCx, dilated up to 3.9 mm at high pressure.  Final angiogram demonstrates less than 10% residual stenosis with TIMI-3 flow.  Placement of a second layer of stent was not performed given concerns about patient's medication compliance.   Recommendations: Continue tirofiban infusion for 6 hours. Dual antiplatelet therapy with aspirin and prasugrel for at least 12 months, ideally indefinite DAPT in the setting of multiple PCI's and very late stent thrombosis. Aggressive secondary prevention, including high intensity statin therapy and smoking  cessation. Continue diuresis as blood pressure and renal function allows; patient received furosemide 40 mg IV x 1 during the procedure. Obtain echocardiogram.   Yvonne Kendall, MD [facility]  Diagnostic Dominance: Right  Intervention   Echo: 11/18/2022  IMPRESSIONS     1. Left ventricular ejection fraction, by estimation, is 30 to 35%. The  left ventricle has moderately decreased function. The left ventricle  demonstrates regional wall motion abnormalities with inferior hypokinesis,  mid to apical inferolateral akinesis,   mid to apical anteroseptal akinesis, akinesis of the apical anterior wall  and true apex. There is mild concentric left ventricular hypertrophy. Left  ventricular diastolic parameters are consistent with Grade I diastolic  dysfunction (impaired  relaxation).   2. Right ventricular systolic function is normal. The right ventricular  size is normal. Tricuspid regurgitation signal is inadequate for assessing  PA pressure.   3. The mitral valve is normal in structure. Trivial mitral valve  regurgitation. No evidence of mitral stenosis.   4. The aortic valve is tricuspid. There is mild calcification of the  aortic valve. Aortic valve regurgitation is not visualized. No aortic  stenosis is present.   5. The inferior vena cava is dilated in size with >50% respiratory  variability, suggesting right atrial pressure of 8 mmHg.   FINDINGS   Left Ventricle: Left ventricular ejection fraction, by estimation, is 30  to 35%. The left ventricle has moderately decreased function. The left  ventricle demonstrates regional wall motion abnormalities. Definity  contrast agent was given IV to delineate  the left ventricular endocardial borders. The left ventricular internal  cavity size was normal in size. There is mild concentric left ventricular  hypertrophy. Left ventricular diastolic parameters are consistent with  Grade I diastolic dysfunction  (impaired  relaxation).   Right Ventricle: The right ventricular size is normal. No increase in  right ventricular wall thickness. Right ventricular systolic function is  normal. Tricuspid regurgitation signal is inadequate for assessing PA  pressure.   Left Atrium: Left atrial size was normal in size.   Right Atrium: Right atrial size was normal in size.   Pericardium: There is no evidence of pericardial effusion.   Mitral Valve: The mitral valve is normal in structure. Trivial mitral  valve regurgitation. No evidence of mitral valve stenosis.   Tricuspid Valve: The tricuspid valve is normal in structure. Tricuspid  valve regurgitation is not demonstrated.   Aortic Valve: The aortic valve is tricuspid. There is mild calcification  of the aortic valve. Aortic valve regurgitation is not visualized. No  aortic stenosis is present.   Pulmonic Valve: The pulmonic valve was normal in structure. Pulmonic valve  regurgitation is not visualized.   Aorta: The aortic root is normal in size and structure.   Venous: The inferior vena cava is dilated in size with greater than 50%  respiratory variability, suggesting right atrial pressure of 8 mmHg.   IAS/Shunts: No atrial level shunt detected by color flow Doppler.  _____________   History of Present Illness     Isaac Goodwin is a 52 y.o. male with  history of CAD s/p MI and multiple PCI's, hypertension, hyperlipidemia, tobacco abuse, and homelessness, who was seen 11/18/2022 for the evaluation of chest pain and abnormal EKG. Mr. Yohe was on his way to work when he suddenly developed burning in his chest reminiscent of his prior MI.  Pain was initially 10/10 in intensity though subsided to 7/10 in intensity after EMS arrived and administered sublingual nitroglycerin and morphine.  He had been feeling well though he reported that he has not been taking many of his medications for some time.  He tried to take metoprolol and enalapril when possible.  He was  currently going through divorce with his wife and is homeless.  He denied shortness of breath, palpitations, lightheadedness, and nausea.   Hospital Course     Inferior lateral STEMI CAD status post multiple PCIs -- Underwent cardiac catheterization noted above with balloon angioplasty to subtotally occluded AV groove left circumflex.  Troponins peaked at greater than 13,000.  A stent was not used as he already had 2 layers of previous stent.  Recommendations for DAPT with aspirin/Effient for at least 1 year.  No recurrent chest pain.  Seen by cardiac rehab. -- Continue aspirin, Effient, atorvastatin 80 mg daily, Jardiance 10 mg daily, enalapril 10 mg daily, Toprol XL 25 mg daily  HFrEF ICM -- Echocardiogram with LVEF of 30 to 35%, inferior hypokinesis with mid to apical inferior lateral akinesis, mild LVH, grade 1 diastolic dysfunction, normal RV, trivial MR. -- GDMT: Continue Toprol-XL 25 mg daily, enalapril 10 mg daily, add Jardiance 10 mg daily  HTN -- Well-controlled -- Continue metoprolol XL 25 mg daily, enalapril 10 mg daily  HLD -- LDL 126, total cholesterol 213 -- Continue atorvastatin 80 mg daily -- Will need LFT/FLP in 8 weeks  Tobacco use -- Cessation advised  Homelessness -- Currently going through a divorce and states he is living in hotels and also his car from time to time.  Currently has a job as a Electrical engineer. -- It was discussed with the patient per Dr. Allyson Sabal that he should remain inpatient for an additional 24 to 48 hours but given social constraints and he was adamant on  being discharged today.    Medication sent to [facility] pharmacy prior to discharge.  Close follow-up arranged in the office.  Emphasis made on medication compliance and follow-up in the office.  Did the patient have an acute coronary syndrome (MI, NSTEMI, STEMI, etc) this admission?:  Yes                               AHA/ACC ACS Clinical Performance & Quality Measures: Aspirin prescribed?  - Yes ADP Receptor Inhibitor (Plavix/Clopidogrel, Brilinta/Ticagrelor or Effient/Prasugrel) prescribed (includes medically managed patients)? - Yes Beta Blocker prescribed? - Yes High Intensity Statin (Lipitor 40-80mg  or Crestor 20-40mg ) prescribed? - Yes EF assessed during THIS hospitalization? - Yes For EF <40%, was ACEI/ARB prescribed? - Yes For EF <40%, Aldosterone Antagonist (Spironolactone or Eplerenone) prescribed? - No - Reason:  consider at outpatient follow up appt Cardiac Rehab Phase II ordered (including medically managed patients)? - Yes     The patient will be scheduled for a TOC follow up appointment in 10-14 days.  A message has been sent to the [facility] and Scheduling Pool at the office where the patient should be seen for follow up.  _____________  Discharge Vitals Blood pressure 128/89, pulse 71, temperature (!) 97.4 F (36.3 C), temperature source Oral, resp. rate 19, weight 98.5 kg, SpO2 95%.  Filed Weights   11/18/22 1000  Weight: 98.5 kg    Labs & Radiologic Studies    CBC Recent Labs    11/18/22 0800 11/19/22 0323  WBC 7.0 9.4  HGB 17.2* 18.9*  HCT 49.5 53.8*  MCV 90.2 90.4  PLT 213 217   Basic Metabolic Panel Recent Labs    86/57/84 0800 11/19/22 0323  NA 136 136  K 3.0* 3.9  CL 105 105  CO2 20* 21*  GLUCOSE 224* 112*  BUN 14 14  CREATININE 1.21 1.05  CALCIUM 8.7* 9.1  MG  --  2.1   Liver Function Tests Recent Labs    11/18/22 0800  AST 20  ALT 22  ALKPHOS 56  BILITOT 0.4  PROT 6.4*  ALBUMIN 3.7   No results for input(s): "LIPASE", "AMYLASE" in the last 72 hours. High Sensitivity Troponin:   Recent Labs  Lab 11/18/22 0800 11/18/22 1951  TROPONINIHS 115* 13,611*    BNP Invalid input(s): "POCBNP" D-Dimer No results for input(s): "DDIMER" in the last 72 hours. Hemoglobin A1C Recent Labs    11/18/22 0800  HGBA1C 5.9*   Fasting Lipid Panel Recent Labs    11/18/22 0800  CHOL 201*  HDL 45  LDLCALC 126*  TRIG 152*   CHOLHDL 4.5   Thyroid Function Tests No results for input(s): "TSH", "T4TOTAL", "T3FREE", "THYROIDAB" in the last 72 hours.  Invalid input(s): "FREET3" _____________  ECHOCARDIOGRAM COMPLETE  Result Date: 11/18/2022    ECHOCARDIOGRAM REPORT   Patient Name:   EUDELL MCPHEE Date of Exam: 11/18/2022 Medical Rec #:  [id number]    Height:       77.0 in Accession #:    [id number]   Weight:       217.2 lb Date of Birth:  [birth year]    BSA:          2.316 m Patient Age:    52 years     BP:           138/105 mmHg Patient Gender: M            HR:  55 bpm. Exam Location:  Inpatient Procedure: 2D Echo, Cardiac Doppler, Color Doppler and Intracardiac            Opacification Agent Indications:    122-I22.9 Subsequent ST elevation (STEM) and non-ST elevation                 (NSTEMI) myocardial infarction  History:        Patient has prior history of Echocardiogram examinations. CAD                 and Acute MI, Signs/Symptoms:Chest Pain; Risk                 Factors:Hypertension, Dyslipidemia and Sleep Apnea.  Sonographer:    Sheralyn Boatman RDCS Referring Phys: [id number] CHRISTOPHER END IMPRESSIONS  1. Left ventricular ejection fraction, by estimation, is 30 to 35%. The left ventricle has moderately decreased function. The left ventricle demonstrates regional wall motion abnormalities with inferior hypokinesis, mid to apical inferolateral akinesis,  mid to apical anteroseptal akinesis, akinesis of the apical anterior wall and true apex. There is mild concentric left ventricular hypertrophy. Left ventricular diastolic parameters are consistent with Grade I diastolic dysfunction (impaired relaxation).  2. Right ventricular systolic function is normal. The right ventricular size is normal. Tricuspid regurgitation signal is inadequate for assessing PA pressure.  3. The mitral valve is normal in structure. Trivial mitral valve regurgitation. No evidence of mitral stenosis.  4. The aortic valve is tricuspid. There is mild  calcification of the aortic valve. Aortic valve regurgitation is not visualized. No aortic stenosis is present.  5. The inferior vena cava is dilated in size with >50% respiratory variability, suggesting right atrial pressure of 8 mmHg. FINDINGS  Left Ventricle: Left ventricular ejection fraction, by estimation, is 30 to 35%. The left ventricle has moderately decreased function. The left ventricle demonstrates regional wall motion abnormalities. Definity contrast agent was given IV to delineate the left ventricular endocardial borders. The left ventricular internal cavity size was normal in size. There is mild concentric left ventricular hypertrophy. Left ventricular diastolic parameters are consistent with Grade I diastolic dysfunction (impaired relaxation). Right Ventricle: The right ventricular size is normal. No increase in right ventricular wall thickness. Right ventricular systolic function is normal. Tricuspid regurgitation signal is inadequate for assessing PA pressure. Left Atrium: Left atrial size was normal in size. Right Atrium: Right atrial size was normal in size. Pericardium: There is no evidence of pericardial effusion. Mitral Valve: The mitral valve is normal in structure. Trivial mitral valve regurgitation. No evidence of mitral valve stenosis. Tricuspid Valve: The tricuspid valve is normal in structure. Tricuspid valve regurgitation is not demonstrated. Aortic Valve: The aortic valve is tricuspid. There is mild calcification of the aortic valve. Aortic valve regurgitation is not visualized. No aortic stenosis is present. Pulmonic Valve: The pulmonic valve was normal in structure. Pulmonic valve regurgitation is not visualized. Aorta: The aortic root is normal in size and structure. Venous: The inferior vena cava is dilated in size with greater than 50% respiratory variability, suggesting right atrial pressure of 8 mmHg. IAS/Shunts: No atrial level shunt detected by color flow Doppler.  LEFT  VENTRICLE PLAX 2D LVIDd:         5.10 cm      Diastology LVIDs:         3.90 cm      LV e' medial:    5.11 cm/s LV PW:         1.30 cm  LV E/e' medial:  20.9 LV IVS:        1.47 cm      LV e' lateral:   5.87 cm/s LVOT diam:     2.40 cm      LV E/e' lateral: 18.2 LV SV:         79 LV SV Index:   34 LVOT Area:     4.52 cm  LV Volumes (MOD) LV vol d, MOD A2C: 71.6 ml LV vol d, MOD A4C: 146.0 ml LV vol s, MOD A2C: 50.4 ml LV vol s, MOD A4C: 91.0 ml LV SV MOD A2C:     21.2 ml LV SV MOD A4C:     146.0 ml LV SV MOD BP:      33.4 ml RIGHT VENTRICLE            IVC RV S prime:     9.79 cm/s  IVC diam: 2.20 cm TAPSE (M-mode): 2.4 cm LEFT ATRIUM             Index        RIGHT ATRIUM           Index LA diam:        4.20 cm 1.81 cm/m   RA Area:     13.70 cm LA Vol (A2C):   29.5 ml 12.74 ml/m  RA Volume:   28.40 ml  12.26 ml/m LA Vol (A4C):   42.7 ml 18.43 ml/m LA Biplane Vol: 35.4 ml 15.28 ml/m  AORTIC VALVE LVOT Vmax:   85.90 cm/s LVOT Vmean:  54.700 cm/s LVOT VTI:    0.174 m  AORTA Ao Root diam: 3.60 cm Ao Asc diam:  3.60 cm MITRAL VALVE MV Area (PHT): 3.85 cm     SHUNTS MV Decel Time: 197 msec     Systemic VTI:  0.17 m MV E velocity: 107.00 cm/s  Systemic Diam: 2.40 cm MV A velocity: 123.00 cm/s MV E/A ratio:  0.87 Dalton McleanMD Electronically signed by Wilfred Lacy Signature Date/Time: 11/18/2022/4:15:28 PM    Final    CARDIAC CATHETERIZATION  Result Date: 11/18/2022 Conclusions: Severe three-vessel coronary artery disease, as detailed below.  Culprit lesion for the patient's STEMI is thrombotic subtotal occlusion of the mid LCx stent.  Since his last catheterization in 2022, there has been interval occlusion of the mid LAD, with the distal vessel supplied by left-left collaterals.  Chronic total occlusion of the RCA is unchanged. Patent proximal LAD stent with approximately 20% in-stent restenosis. Patent OM1 stent with up to 50% in-stent restenosis. Widely patent OM2 stent. Mildly reduced left  ventricular systolic function (LVEF 45-50%) with basal and mid inferior hypokinesis/akinesis. Severely elevated left ventricular filling pressure (LVEDP 35-40 mmHg). Successful IVUS-guided PTCA to thrombotic in-stent lesion in the mid LCx, dilated up to 3.9 mm at high pressure.  Final angiogram demonstrates less than 10% residual stenosis with TIMI-3 flow.  Placement of a second layer of stent was not performed given concerns about patient's medication compliance. Recommendations: Continue tirofiban infusion for 6 hours. Dual antiplatelet therapy with aspirin and prasugrel for at least 12 months, ideally indefinite DAPT in the setting of multiple PCI's and very late stent thrombosis. Aggressive secondary prevention, including high intensity statin therapy and smoking cessation. Continue diuresis as blood pressure and renal function allows; patient received furosemide 40 mg IV x 1 during the procedure. Obtain echocardiogram. Yvonne Kendall, MD [facility]  Disposition   Pt is being discharged home today in good condition.  Follow-up  Plans & Appointments     Follow-up Information     Azalee Course, [place] Follow up on 11/26/2022.   Specialties: Cardiology, Radiology Why: at 1:55pm for your follow up appt with cardiology Contact information: [address] [place] [address] [phone]                Discharge Instructions     AMB Referral to Cardiac Rehabilitation - Phase II   Complete by: As directed    Diagnosis:  Coronary Stents STEMI     After initial evaluation and assessments completed: Virtual Based Care may be provided alone or in conjunction with Phase 2 Cardiac Rehab based on patient barriers.: Yes   Intensive Cardiac Rehabilitation (ICR) [facility] location only OR Traditional Cardiac Rehabilitation (TCR) *If criteria for ICR are not met will enroll in TCR [facility] only): Yes   Call MD for:  difficulty breathing, headache or visual disturbances   Complete by: As  directed    Call MD for:  persistant dizziness or light-headedness   Complete by: As directed    Call MD for:  redness, tenderness, or signs of infection (pain, swelling, redness, odor or green/yellow discharge around incision site)   Complete by: As directed    Diet - low sodium heart healthy   Complete by: As directed    Discharge instructions   Complete by: As directed    Radial Site Care Refer to this sheet in the next few weeks. These instructions provide you with information on caring for yourself after your procedure. Your caregiver may also give you more specific instructions. Your treatment has been planned according to current medical practices, but problems sometimes occur. Call your caregiver if you have any problems or questions after your procedure. HOME CARE INSTRUCTIONS You may shower the day after the procedure. Remove the bandage (dressing) and gently wash the site with plain soap and water. Gently pat the site dry.  Do not apply powder or lotion to the site.  Do not submerge the affected site in water for 3 to 5 days.  Inspect the site at least twice daily.  Do not flex or bend the affected arm for 24 hours.  No lifting over 5 pounds (2.3 kg) for 5 days after your procedure.  Do not drive home if you are discharged the same day of the procedure. Have someone else drive you.  You may drive 24 hours after the procedure unless otherwise instructed by your caregiver.  What to expect: Any bruising will usually fade within 1 to 2 weeks.  Blood that collects in the tissue (hematoma) may be painful to the touch. It should usually decrease in size and tenderness within 1 to 2 weeks.  SEEK IMMEDIATE MEDICAL CARE IF: You have unusual pain at the radial site.  You have redness, warmth, swelling, or pain at the radial site.  You have drainage (other than a small amount of blood on the dressing).  You have chills.  You have a fever or persistent symptoms for more than 72 hours.   You have a fever and your symptoms suddenly get worse.  Your arm becomes pale, cool, tingly, or numb.  You have heavy bleeding from the site. Hold pressure on the site.   PLEASE DO NOT MISS ANY DOSES OF YOUR EFFIENT!!!!! Also keep a log of you blood pressures and bring back to your follow up appt. Please call the office with any questions.   Patients taking blood thinners should generally stay  away from medicines like ibuprofen, Advil, Motrin, naproxen, and Aleve due to risk of stomach bleeding. You may take Tylenol as directed or talk to your primary doctor about alternatives.   PLEASE ENSURE THAT YOU DO NOT RUN OUT OF YOUR EFFIENT. This medication is very important to remain on for at least one year. IF you have issues obtaining this medication due to cost please CALL the office 3-5 business days prior to running out in order to prevent missing doses of this medication.   Increase activity slowly   Complete by: As directed         Discharge Medications   Allergies as of 11/19/2022       Reactions   Neurontin [gabapentin] Other (See Comments)   Memory loss LOC changes   Penicillins Anaphylaxis, Shortness Of Breath, Other (See Comments)   Childhood allergy        Medication List     STOP taking these medications    ezetimibe 10 MG tablet Commonly known as: ZETIA   ibuprofen 200 MG tablet Commonly known as: ADVIL       TAKE these medications    acetaminophen 500 MG tablet Commonly known as: TYLENOL Take 500 mg by mouth daily as needed for mild pain.   aspirin 81 MG chewable tablet Chew 1 tablet (81 mg total) by mouth daily.   atorvastatin 80 MG tablet Commonly known as: LIPITOR Take 1 tablet (80 mg total) by mouth daily.   Blood Pressure Kit Devi Use to check blood pressure daily.   empagliflozin 10 MG Tabs tablet Commonly known as: JARDIANCE Take 1 tablet (10 mg total) by mouth daily.   enalapril 10 MG tablet Commonly known as: VASOTEC Take 1  tablet (10 mg total) by mouth 2 (two) times daily. What changed: when to take this   metoprolol succinate 25 MG 24 hr tablet Commonly known as: TOPROL-XL Take 1 tablet (25 mg total) by mouth daily. What changed:  medication strength how much to take additional instructions   nitroGLYCERIN 0.4 MG SL tablet Commonly known as: Nitrostat Place 1 tablet (0.4 mg total) under the tongue every 5 (five) minutes as needed for chest pain.   prasugrel 10 MG Tabs tablet Commonly known as: EFFIENT Take 1 tablet (10 mg total) by mouth daily.         Outstanding Labs/Studies   BMET at follow up  FLP/LFTs in 8 weeks   Duration of Discharge Encounter   Greater than 30 minutes including physician time.  Signed, [name], NP 11/19/2022, 12:17 PM   Agree with note by [name] NP-C  Mr. Chui was admitted with lateral STEMI.  He is a patient of Dr. Landry Dyke.  He said prior stenting back in 2009 and 2022.  His other problems include ongoing tobacco abuse, hypertension, hyperlipidemia.  2D echo revealed moderately severe LV dysfunction.  He had IVUS guided Cutting Balloon atherectomy of his AV groove circumflex.  His RCA was occluded with left-to-right collaterals as was his LAD.  He has had no recurrent symptoms.  He is on DAPT.  Because of social issues he request discharge today, sooner than would otherwise medically be indicated.  Unfortunately, he is fiscally  constrained and homeless.  We have asked social service to help advise.  Will arrange TOC 7 in the advanced heart failure clinic and outpatient follow-up with Dr. [name].   Runell Gess, M.D., FACP, [facility], [facility] [address] [place],  [place]  [address]  [phone] 11/19/2022 12:24 PM

## 2022-11-19 NOTE — Progress Notes (Signed)
   Heart Failure Stewardship Pharmacist Progress Note   PCP: Claiborne Rigg, NP PCP-Cardiologist: Nicki Guadalajara, MD    HPI:  51 yo M with PMH of CAD, HTN, HLD, tobacco use, and homelessness.   Presented to the ED on 9/30 with chest pain. EKG with significant inferolateral ST elevation. Taken emergently to the cath lab and found to have severe three vessel disease. Culprit lesion for STEMI was thrombotic subtotal occlusion of the mid LCx stent. Patent prox LAD stent and patent OM2 stent. S/p successful PCI. Additional stent was not placed due to compliance issues. ECHO 9/30 showed LVEF 30-35% (last EF 50-55%), RWMA, G1DD, RV normal.   Discharge HF Medications: Beta Blocker: metoprolol XL 25 mg daily ACE/ARB/ARNI: enalapril 10 mg BID SGLT2i: Jardiance 10 mg daily  Prior to admission HF Medications: Beta blocker: metoprolol XL 75 mg daily ACE/ARB/ARNI: enalapril 10 mg daily  Pertinent Lab Values: Serum creatinine 1.05, BUN 14, Potassium 3.9, Sodium 136, Magnesium 2.1, A1c 5.9   Vital Signs: Weight: 217 lbs (admission weight: 217 lbs) Blood pressure: 90-120/80s  Heart rate: 60-70s  I/O: net -2.2L yesterday; net -3.1L since admission  Medication Assistance / Insurance Benefits Check: Does the patient have prescription insurance?  No  Outpatient Pharmacy:  Prior to admission outpatient pharmacy: [facility] Is the patient willing to use [facility] TOC pharmacy at discharge? Yes  Assessment: 1. Acute systolic CHF (LVEF 30-35%), due to ICM. NYHA class II symptoms. - Does not appear volume overloaded on exam - Continue metoprolol XL 25 mg daily (reduced from 75 mg daily - PTA dose) - Continue enalapril 10 mg BID (was taking once daily PTA) - Consider adding spironolactone at follow up pending BP - On Jardiance 10 mg daily. He is uninsured and does not have stable housing. May be difficult for him to stay on this long term. He states he goes to the community [facility]ness pharmacy. They may be able to assist with obtaining Jardiance. Recent literature published with limited benefit of adding SGLT2i without additional signs of HF post STEMI.    Plan: 1) Medication changes recommended at this time: - Add spironolactone at follow up pending BP - Assess compliance and housing at follow up to determine access to Jardiance  2) Patient assistance: - No insurance - will need patient assistance for Jardiance but housing is unstable at the moment.   3)  Education  - Patient has been educated on current HF medications and potential additions to HF medication regimen - Patient verbalizes understanding that over the next few months, these medication doses may change and more medications may be added to optimize HF regimen - Patient has been educated on basic disease state pathophysiology and goals of therapy   Sharen Hones, PharmD, BCPS Heart Failure Stewardship Pharmacist Phone [phone]

## 2022-11-19 NOTE — TOC Transition Note (Addendum)
Transition of Care [facility]) - CM/SW Discharge Note   Patient Details  Name: Isaac Goodwin MRN: [id number] Date of Birth: [birth year]  Transition of Care [facility]) CM/SW Contact:  Gala Lewandowsky, RN Phone Number: 11/19/2022, 2:48 PM   Clinical Narrative: MATCH completed for this patient upon discharge. CSW did speak with patients regarding SDOH resources. No further needs identified at this time.   MATCH MEDICATION ASSISTANCE CARD Pharmacies please call [phone] for claim processing assistance. Rx BIN: [id number] Rx Group: [id number] Rx PCN: [id number] Relationship Code: 1 Person Code: [id number] Patient ID (MRN): [name]  Patient Name: Isaac Goodwin Patient DOB: [birth year] Discharge Date: 11-19-22 Expiration Date: 11-27-22 (must be filled within 7 days of discharge)      Final next level of care: Home/Self Care Barriers to Discharge: No Barriers Identified   Discharge Plan and Services Additional resources added to the After Visit Summary for     Discharge Planning Services: CM Consult   Social Determinants of Health (SDOH) Interventions SDOH Screenings   Food Insecurity: No Food Insecurity (11/18/2022)  Housing: Patient Declined (11/18/2022)  Transportation Needs: No Transportation Needs (11/18/2022)  Utilities: At Risk (11/18/2022)  Depression (PHQ2-9): High Risk (04/02/2022)  Tobacco Use: High Risk (11/19/2022)    Readmission Risk Interventions     No data to display

## 2022-11-19 NOTE — Telephone Encounter (Signed)
   Transition of Care Follow-up Phone Call Request    Patient Name: Isaac Goodwin Date of Birth: [birth year] Date of Encounter: 11/19/2022  Primary Care Provider:  Claiborne Rigg, NP Primary Cardiologist:  Nicki Guadalajara, MD  [name] has been scheduled for a transition of care follow up appointment with a [facility] provider:  Azalee Course 10/8  Please reach out to [name] within 48 hours of discharge to confirm appointment and review transition of care protocol questionnaire. Anticipated discharge date: 10/1  [name], NP  11/19/2022, 12:05 PM

## 2022-11-20 ENCOUNTER — Telehealth: Payer: Self-pay

## 2022-11-20 LAB — LIPOPROTEIN A (LPA): Lipoprotein (a): 11 nmol/L (ref <75.0)

## 2022-11-20 NOTE — Telephone Encounter (Signed)
LM to call office

## 2022-11-20 NOTE — Transitions of Care (Post Inpatient/ED Visit) (Signed)
11/20/2022  Name: Isaac Goodwin MRN: [id number] DOB: 1970/03/04  Today's TOC FU Call Status: Today's TOC FU Call Status:: Successful TOC FU Call Completed TOC FU Call Complete Date: 11/20/22 Patient's Name and Date of Birth confirmed.  Transition Care Management Follow-up Telephone Call Date of Discharge: 11/20/22 Discharge Facility: [facility]) Type of Discharge: Inpatient Admission Primary Inpatient Discharge Diagnosis:: STEMI Left circumflex artery How have you been since you were released from the hospital?: Better Any questions or concerns?: Yes Patient Questions/Concerns:: Although he reports this is not a new issue- he expressed concerns about right  leg tingling after remaining sedentary for periods of time- resolves if he increases mobility. He will disxuss with Cardiology Patient Questions/Concerns Addressed: Other: (Discussed causes and interventions He will discuss with cardiology , No mobility restrictions)  Items Reviewed: Did you receive and understand the discharge instructions provided?: Yes Medications obtained,verified, and reconciled?: Yes (Medications Reviewed) (He stated he also takes Wellbutrin- He has refills availalbe although this was not included on his medication list. He feels he needs this with everything he has going on at the time) Any new allergies since your discharge?: No Dietary orders reviewed?: Yes Type of Diet Ordered:: Attemots to eat low fat low salt diet - but reports it is difficult to prepare and afford Heart Health[facility] meals given his current living situation Do you have support at home?: No  Medications Reviewed Today: Medications Reviewed Today     Reviewed by Johnnette Barrios, RN (Registered Nurse) on 11/20/22 at 1055  Med List Status: <None>   Medication Order Taking? Sig Documenting Provider Last Dose Status Informant  acetaminophen (TYLENOL) 500 MG tablet [id number] Yes Take 500 mg by mouth daily as needed for mild pain. [provider] Taking Active Self, Pharmacy Records  aspirin 81 MG chewable tablet [id number] Yes Chew 1 tablet (81 mg total) by mouth daily. Runell Gess, MD Taking Active   atorvastatin (LIPITOR) 80 MG tablet [id number] Yes Take 1 tablet (80 mg total) by mouth daily. Runell Gess, MD Taking Active   Blood Pressure Monitoring (BLOOD PRESSURE KIT) DEVI [id number] No Use to check blood pressure daily.  Patient not taking: Reported on 11/20/2022   Hoy Register, MD Not Taking Active Self, Pharmacy Records           Med Note Sharon Seller, Neftaly Swiss L   Wed Nov 20, 2022 10:51 AM) Not taking B/P needs a B/P cuff- he is unable to  purchase  empagliflozin (JARDIANCE) 10 MG TABS tablet [id number] Yes Take 1 tablet (10 mg total) by mouth daily. Runell Gess, MD Taking Active   enalapril (VASOTEC) 10 MG tablet [id number] Yes Take 1 tablet (10 mg total) by mouth 2 (two) times daily. Runell Gess, MD Taking Active   metoprolol succinate (TOPROL-XL) 25 MG 24 hr tablet [id number] Yes Take 1 tablet (25 mg total) by mouth daily. Runell Gess, MD Taking Active   nitroGLYCERIN (NITROSTAT) 0.4 MG SL tablet [id number] Yes Place 1 tablet (0.4 mg total) under the tongue every 5 (five) minutes as needed for chest pain. Runell Gess, MD Taking Active   prasugrel (EFFIENT) 10 MG TABS tablet [id number] Yes Take 1 tablet (10 mg total) by mouth daily. Runell Gess, MD Taking Active             Home Care and Equipment/Supplies: Were Home Health Services Ordered?: No Any new equipment or medical supplies ordered?: No  Functional Questionnaire: Do you need assistance with  bathing/showering or dressing?: No Do you need assistance with meal preparation?: No Do you need assistance with eating?: No Do you have difficulty maintaining continence: No Do you need assistance with getting out of bed/getting out of a chair/moving?: No Do you have difficulty managing or taking your medications?: No  Follow  up appointments reviewed: PCP Follow-up appointment confirmed?: No (He declined an PCP appt, stating he needs to be mindful of co-pays as his income is very limited  and prefers to follow-up with cardiology given his hospitilization was r/t cardiac issues/-He will call PCP and notify them of his recent hospitilization) MD Provider Line [phone] Given: No  SDOH Interventions Today    Flowsheet Row Most Recent Value  SDOH Interventions   Housing Interventions Patient Declined  [Reports He is staying in [place] and occ sleeps in car , working on getting a place]  Transportation Interventions Intervention Not Indicated  [He has vehicle and drives]  Financial Strain Interventions Other (Comment)  [He is currentlu working, and trying to get back in his feet, secure a place to live. He is going thru a divorce ( married 20 yrs) .He is cautious of cost and expenses,particularily medications and co-pays ( medical bills)]      .  Goals Addressed             This Visit's Progress    TOC Care Plan       Current Barriers:  Lacks caregiver support Financial Constraints   RNCM Clinical Goal(s):  Patient will work with the Care Management team over the next 30 days to address Transition of Care Barriers: Medication access Medication Management Diet/Nutrition/Food Resources Provider appointments take all medications exactly as prescribed and will call provider for medication related questions as evidenced by no missed medications, ensuring medications are refilled timely attend all scheduled medical appointments: with specialist ( Cardiology) and notify PCP of any changes in medications and or health status  as evidenced by no missed appoints and Medical record reflects any pertinent changes  demonstrate ongoing self health care management ability with lifestyle changes, including diet, smoking cessation and medication adherence  as evidenced by no unscheduled ER visits or hospital  admissions   through collaboration with RN Care manager, provider, and care team.   Interventions: Evaluation of current treatment plan related to  self management and patient's adherence to plan as established by provider  Transitions of Care:  New goal. Doctor Visits  - discussed the importance of doctor visits Reviewed Signs and symptoms of infection  CAD Interventions: (Status:  New goal.) Short Term Goal Assessed understanding of CAD diagnosis Medications reviewed including medications utilized in CAD treatment plan Provided education on importance of blood pressure control in management of CAD Provided education on Importance of limiting foods high in cholesterol Reviewed Importance of taking all medications as prescribed Reviewed Importance of attending all scheduled provider appointments Advised to report any changes in symptoms or exercise tolerance Advised patient to discuss current medications, potential need for counseling  and any s/s that develop s/p hospital stay with provider Assessed social determinant of health barriers  Patient Goals/Self-Care Activities: Participate in Transition of Care Program/Attend TOC scheduled calls Take all medications as prescribed Attend all scheduled provider appointments  Follow Up Plan:  Telephone follow up appointment with care management team member scheduled for:  11/27/22 at 2:00pm  The patient has been provided with contact information for the care management team and has been advised to call with any health related questions or  concerns.          Susa Loffler , BSN, RN Care Management Coordinator [place]   [facility] [email or web address] Direct Dial: [phone]

## 2022-11-21 NOTE — Telephone Encounter (Signed)
Third attempt to Call to patient.  LM regarding F/U appt with date and time and location

## 2022-11-22 ENCOUNTER — Telehealth (HOSPITAL_COMMUNITY): Payer: Self-pay

## 2022-11-22 NOTE — Telephone Encounter (Signed)
Called patient to see if he is interested in the Cardiac Rehab Program. Patient expressed interest. Explained scheduling process and went over insurance, patient verbalized understanding. Will contact patient for scheduling once f/u has been completed.    Pt stated he is still waiting to hear back from Medicaid.

## 2022-11-25 NOTE — Progress Notes (Unsigned)
This encounter was created in error - please disregard.

## 2022-11-26 ENCOUNTER — Ambulatory Visit: Payer: MEDICAID | Attending: Physician Assistant | Admitting: Physician Assistant

## 2022-11-27 ENCOUNTER — Other Ambulatory Visit: Payer: Self-pay

## 2022-11-27 ENCOUNTER — Telehealth: Payer: Self-pay

## 2022-11-27 NOTE — Patient Outreach (Signed)
  Care Management  Transitions of Care Program Transitions of Care Post-discharge week 2  11/27/2022 Name: Isaac Goodwin MRN: [id number] DOB: Feb 18, 1971  Subjective: Isaac Goodwin is a 52 y.o. year old male who is a primary care patient of Claiborne Rigg, NP. The Care Management team was unable to reach the patient by phone to assess and address transitions of care needs.   Plan: Additional outreach attempts will be made to reach the patient enrolled in the [facility] Program (Post Inpatient/ED Visit). 11/28/22 Phone number listed had generic VM with no identifiers -no message left   Susa Loffler , BSN, RN Care Management Coordinator [place]   [facility] [email or web address] Direct Dial: [phone]

## 2022-11-29 ENCOUNTER — Telehealth: Payer: Self-pay

## 2022-11-29 NOTE — Patient Outreach (Signed)
  Care Management  Transitions of Care Program Transitions of Care Post-discharge week 2  11/29/2022 Name: Isaac Goodwin MRN: [id number] DOB: 04-19-1970  Subjective: Isaac Goodwin is a 52 y.o. year old male who is a primary care patient of Claiborne Rigg, NP. The Care Management team was unable to reach the patient by phone to assess and address transitions of care needs.   Plan: Additional outreach attempts will be made to reach the patient enrolled in the [facility] Program (Post Inpatient/ED Visit).  Susa Loffler , BSN, RN Care Management Coordinator [place]   [facility] [email or web address] Direct Dial: [phone]

## 2022-12-02 ENCOUNTER — Telehealth: Payer: Self-pay

## 2022-12-02 NOTE — Patient Outreach (Addendum)
  Care Management  Transitions of Care Program Transitions of Care Post-discharge week 2  12/02/2022 Name: Isaac Goodwin MRN: [id number] DOB: 1970-02-29  Subjective: Isaac Goodwin is a 51 y.o. year old male who is a primary care patient of Claiborne Rigg, NP. The Care Management team was unable to reach the patient by phone to assess and address transitions of care needs. Pt has generic voice mail with no identifying contact info.unable to LM    Goals Addressed             This Visit's Progress    COMPLETED: TOC Care Plan       Current Barriers:  Lacks caregiver support Financial Constraints   RNCM Clinical Goal(s):  Patient will work with the Care Management team over the next 30 days to address Transition of Care Barriers: Medication access Medication Management Diet/Nutrition/Food Resources Provider appointments take all medications exactly as prescribed and will call provider for medication related questions as evidenced by no missed medications, ensuring medications are refilled timely attend all scheduled medical appointments: with specialist ( Cardiology) and notify PCP of any changes in medications and or health status  as evidenced by no missed appoints and Medical record reflects any pertinent changes  demonstrate ongoing self health care management ability with lifestyle changes, including diet, smoking cessation and medication adherence  as evidenced by no unscheduled ER visits or hospital admissions   through collaboration with RN Care manager, provider, and care team.   Interventions: Evaluation of current treatment plan related to  self management and patient's adherence to plan as established by provider  Transitions of Care:   Care Plan closed -unable to reach patient Doctor Visits  - discussed the importance of doctor visits Reviewed Signs and symptoms of infection  CAD Interventions: (Status:   unable to reach patient  ) Short Term Goal Assessed understanding  of CAD diagnosis Medications reviewed including medications utilized in CAD treatment plan Provided education on importance of blood pressure control in management of CAD Provided education on Importance of limiting foods high in cholesterol Reviewed Importance of taking all medications as prescribed Reviewed Importance of attending all scheduled provider appointments Advised to report any changes in symptoms or exercise tolerance Advised patient to discuss current medications, potential need for counseling  and any s/s that develop s/p hospital stay with provider Assessed social determinant of health barriers  Patient Goals/Self-Care Activities: Participate in Transition of Care Program/Attend TOC scheduled calls Take all medications as prescribed Attend all scheduled provider appointments  Follow Up Plan:  We have been unable to make contact with the patient for follow up. The care management team is available to follow up with the patient after provider conversation with the patient regarding recommendation for care management engagement and subsequent re-referral to the care management team.          Plan: No further outreach attempts will be made at this time.  We have been unable to reach the patient.despite 3 separate attempts.   Susa Loffler , BSN, RN Care Management Coordinator [place]   [facility] [email or web address] Direct Dial: [phone]

## 2022-12-13 ENCOUNTER — Other Ambulatory Visit: Payer: Self-pay | Admitting: Family Medicine

## 2022-12-13 ENCOUNTER — Other Ambulatory Visit: Payer: Self-pay

## 2022-12-13 DIAGNOSIS — F419 Anxiety disorder, unspecified: Secondary | ICD-10-CM

## 2022-12-23 ENCOUNTER — Other Ambulatory Visit: Payer: Self-pay

## 2022-12-24 ENCOUNTER — Other Ambulatory Visit: Payer: Self-pay

## 2022-12-24 ENCOUNTER — Other Ambulatory Visit: Payer: Self-pay | Admitting: Nurse Practitioner

## 2022-12-24 DIAGNOSIS — F17200 Nicotine dependence, unspecified, uncomplicated: Secondary | ICD-10-CM

## 2022-12-27 ENCOUNTER — Other Ambulatory Visit: Payer: Self-pay

## 2022-12-30 ENCOUNTER — Other Ambulatory Visit: Payer: Self-pay

## 2023-04-04 ENCOUNTER — Other Ambulatory Visit: Payer: Self-pay | Admitting: Nurse Practitioner

## 2023-04-04 DIAGNOSIS — I214 Non-ST elevation (NSTEMI) myocardial infarction: Secondary | ICD-10-CM

## 2023-04-04 NOTE — Telephone Encounter (Signed)
Copied from CRM [id number]. Topic: Clinical - Medication Refill >> Apr 04, 2023  4:46 PM Shelah Lewandowsky wrote: Most Recent Primary Care Visit:  Provider: [name] W  Department: [facility]  Visit Type: OFFICE VISIT  Date: 04/02/2022  Medication: enalapril (VASOTEC) 10 MG tablet metoprolol succinate (TOPROL-XL) 25 MG 24 hr tablet hydrOXYzine (ATARAX) tablet 25 mg empagliflozin (JARDIANCE) 10 MG TABS tablet atorvastatin (LIPITOR) 80 MG tablet  Has the patient contacted their pharmacy? Yes (Agent: If no, request that the patient contact the pharmacy for the refill. If patient does not wish to contact the pharmacy document the reason why and proceed with request.) (Agent: If yes, when and what did the pharmacy advise?)  Is this the correct pharmacy for this prescription? Yes If no, delete pharmacy and type the correct one.  This is the patient's preferred pharmacy:  [facility] [address], [address] [place] [address] Phone: [phone] Fax: [phone]    Has the prescription been filled recently? Yes  Is the patient out of the medication? Yes  Has the patient been seen for an appointment in the last year OR does the patient have an upcoming appointment? Yes  Can we respond through MyChart? Yes  Agent: Please be advised that Rx refills may take up to 3 business days. We ask that you follow-up with your pharmacy.

## 2023-04-29 ENCOUNTER — Other Ambulatory Visit: Payer: Self-pay

## 2023-04-30 ENCOUNTER — Other Ambulatory Visit: Payer: Self-pay

## 2023-05-01 ENCOUNTER — Other Ambulatory Visit: Payer: Self-pay

## 2023-05-09 ENCOUNTER — Telehealth: Payer: Self-pay | Admitting: Cardiovascular Disease

## 2023-05-09 ENCOUNTER — Other Ambulatory Visit: Payer: Self-pay

## 2023-05-09 NOTE — Telephone Encounter (Signed)
 Spoke with pt, medications discussed per discharge summ from 11/2022. Follow up scheduled to confirm he is taking the correct medications.

## 2023-05-09 NOTE — Telephone Encounter (Signed)
 Pt c/o medication issue:  1. Name of Medication: All his medications  2. How are you currently taking this medication (dosage and times per day)?    3. Are you having a reaction (difficulty breathing--STAT)?    4. What is your medication issue? Pt is very about all his medications. He states he needs someone to help him figure out what is going on

## 2023-05-26 ENCOUNTER — Ambulatory Visit: Payer: Self-pay | Attending: Cardiovascular Disease | Admitting: Cardiovascular Disease

## 2023-05-27 ENCOUNTER — Encounter: Payer: Self-pay | Admitting: Cardiovascular Disease
